# Patient Record
Sex: Female | Born: 1970
Health system: Southern US, Community
[De-identification: ages and names within clinical notes are randomized; demographics above are authoritative.]

## PROBLEM LIST (undated history)

## (undated) DIAGNOSIS — M1990 Unspecified osteoarthritis, unspecified site: Secondary | ICD-10-CM

## (undated) DIAGNOSIS — K219 Gastro-esophageal reflux disease without esophagitis: Secondary | ICD-10-CM

## (undated) DIAGNOSIS — F32A Depression, unspecified: Secondary | ICD-10-CM

## (undated) DIAGNOSIS — D649 Anemia, unspecified: Secondary | ICD-10-CM

## (undated) DIAGNOSIS — F419 Anxiety disorder, unspecified: Secondary | ICD-10-CM

## (undated) DIAGNOSIS — D497 Neoplasm of unspecified behavior of endocrine glands and other parts of nervous system: Secondary | ICD-10-CM

## (undated) DIAGNOSIS — R002 Palpitations: Secondary | ICD-10-CM

## (undated) HISTORY — DX: Gastro-esophageal reflux disease without esophagitis: K21.9

## (undated) HISTORY — DX: Palpitations: R00.2

## (undated) HISTORY — PX: TONSILLECTOMY: SUR1361

## (undated) HISTORY — PX: COLONOSCOPY: SHX174

## (undated) HISTORY — DX: Unspecified osteoarthritis, unspecified site: M19.90

## (undated) HISTORY — PX: ABDOMINAL HYSTERECTOMY: SHX81

---

## 2003-08-10 ENCOUNTER — Emergency Department (HOSPITAL_COMMUNITY): Admission: AD | Admit: 2003-08-10 | Discharge: 2003-08-10 | Payer: Self-pay | Admitting: Family Medicine

## 2004-06-15 ENCOUNTER — Ambulatory Visit (HOSPITAL_COMMUNITY): Admission: RE | Admit: 2004-06-15 | Discharge: 2004-06-15 | Payer: Self-pay | Admitting: Chiropractic Medicine

## 2005-11-14 ENCOUNTER — Emergency Department (HOSPITAL_COMMUNITY): Admission: EM | Admit: 2005-11-14 | Discharge: 2005-11-14 | Payer: Self-pay | Admitting: Emergency Medicine

## 2007-06-22 ENCOUNTER — Emergency Department (HOSPITAL_COMMUNITY): Admission: EM | Admit: 2007-06-22 | Discharge: 2007-06-22 | Payer: Self-pay | Admitting: Emergency Medicine

## 2008-06-24 ENCOUNTER — Emergency Department (HOSPITAL_COMMUNITY): Admission: EM | Admit: 2008-06-24 | Discharge: 2008-06-24 | Payer: Self-pay | Admitting: Family Medicine

## 2009-12-21 ENCOUNTER — Emergency Department (HOSPITAL_COMMUNITY): Admission: EM | Admit: 2009-12-21 | Discharge: 2009-12-21 | Payer: Self-pay | Admitting: Emergency Medicine

## 2010-04-14 ENCOUNTER — Emergency Department (HOSPITAL_COMMUNITY): Admission: EM | Admit: 2010-04-14 | Discharge: 2010-04-14 | Payer: Self-pay | Admitting: Family Medicine

## 2010-07-01 ENCOUNTER — Encounter
Admission: RE | Admit: 2010-07-01 | Discharge: 2010-07-01 | Payer: Self-pay | Source: Home / Self Care | Attending: Chiropractic Medicine | Admitting: Chiropractic Medicine

## 2011-04-01 LAB — POCT URINALYSIS DIP (DEVICE)
Bilirubin Urine: NEGATIVE
Glucose, UA: NEGATIVE mg/dL
Hgb urine dipstick: NEGATIVE
Ketones, ur: NEGATIVE mg/dL
Nitrite: NEGATIVE
Protein, ur: NEGATIVE mg/dL
Specific Gravity, Urine: 1.01 (ref 1.005–1.030)
Urobilinogen, UA: 0.2 mg/dL (ref 0.0–1.0)
pH: 6 (ref 5.0–8.0)

## 2011-04-01 LAB — POCT PREGNANCY, URINE: Preg Test, Ur: NEGATIVE

## 2011-08-01 ENCOUNTER — Other Ambulatory Visit: Payer: Self-pay | Admitting: Obstetrics and Gynecology

## 2011-08-04 ENCOUNTER — Other Ambulatory Visit: Payer: Self-pay

## 2011-08-08 ENCOUNTER — Ambulatory Visit
Admission: RE | Admit: 2011-08-08 | Discharge: 2011-08-08 | Disposition: A | Discharge status: Home / Self Care | Payer: BC Managed Care – PPO | Source: Ambulatory Visit | Attending: Obstetrics and Gynecology | Admitting: Obstetrics and Gynecology

## 2011-08-08 MED ORDER — GADOBENATE DIMEGLUMINE 529 MG/ML IV SOLN: 7.0000 mL | Freq: Once | INTRAVENOUS | Status: AC | PRN

## 2011-11-29 ENCOUNTER — Other Ambulatory Visit: Payer: Self-pay | Admitting: *Deleted

## 2011-11-29 ENCOUNTER — Ambulatory Visit: Payer: BC Managed Care – PPO | Admitting: Emergency Medicine

## 2011-11-29 ENCOUNTER — Encounter: Payer: Self-pay | Admitting: Family Medicine

## 2011-11-29 VITALS — BP 105/63 | HR 70 | Temp 98.6°F | Resp 16 | Ht 63.0 in | Wt 129.0 lb

## 2011-11-29 DIAGNOSIS — E860 Dehydration: Secondary | ICD-10-CM

## 2011-11-29 DIAGNOSIS — A084 Viral intestinal infection, unspecified: Secondary | ICD-10-CM

## 2011-11-29 DIAGNOSIS — A088 Other specified intestinal infections: Secondary | ICD-10-CM

## 2011-11-29 DIAGNOSIS — F3131 Bipolar disorder, current episode depressed, mild: Secondary | ICD-10-CM

## 2011-11-29 MED ORDER — ONDANSETRON 8 MG PO TBDP: 8.0000 mg | ORAL_TABLET | Freq: Three times a day (TID) | ORAL | Status: AC | PRN

## 2011-11-29 MED ORDER — LOPERAMIDE HCL 2 MG PO TABS: ORAL_TABLET | ORAL | Status: DC

## 2011-11-29 MED ORDER — LOPERAMIDE HCL 2 MG PO TABS: 4.0000 mg | ORAL_TABLET | ORAL | Status: DC | PRN

## 2011-11-29 NOTE — Progress Notes (Signed)
  Subjective:    Patient ID: Connie Cole, female    DOB: 08-26-1970, 41 y.o.   MRN: [id number]  Emesis  This is a new problem. The current episode started in the past 7 days. The problem occurs 5 to 10 times per day. The problem has been gradually improving. The emesis has an appearance of bile and stomach contents. The maximum temperature recorded prior to her arrival was 100 - 100.9 F. The fever has been present for 1 to 2 days. Associated symptoms include abdominal pain, chills, coughing, diarrhea, dizziness, a fever, headaches and myalgias. Pertinent negatives include no arthralgias, chest pain, sweats, URI or weight loss.  Abdominal Pain Associated symptoms include diarrhea, a fever, headaches, myalgias and vomiting. Pertinent negatives include no arthralgias or weight loss.      Review of Systems  Constitutional: Positive for fever, chills and fatigue. Negative for weight loss.  HENT: Positive for congestion.   Eyes: Negative.   Respiratory: Positive for cough.   Cardiovascular: Negative.  Negative for chest pain.  Gastrointestinal: Positive for vomiting, abdominal pain and diarrhea.  Genitourinary: Negative.   Musculoskeletal: Positive for myalgias. Negative for arthralgias.  Neurological: Positive for dizziness and headaches.       Objective:   Physical Exam  Constitutional: She is oriented to person, place, and time. She appears well-developed and well-nourished.  HENT:  Head: Normocephalic and atraumatic.  Right Ear: External ear normal.  Left Ear: External ear normal.  Eyes: Conjunctivae and EOM are normal. Pupils are equal, round, and reactive to light.  Neck: Normal range of motion. Neck supple.  Cardiovascular: Normal rate, regular rhythm and normal heart sounds.   Pulmonary/Chest: Effort normal and breath sounds normal.  Abdominal: Soft. Bowel sounds are increased. There is no tenderness. There is no rebound and no guarding.  Musculoskeletal: Normal range of  motion.  Neurological: She is oriented to person, place, and time.  Skin: Skin is warm and dry.          Assessment & Plan:  Symptoms started after eating burgers over the weekend

## 2011-11-29 NOTE — Patient Instructions (Signed)
Viral Gastroenteritis Viral gastroenteritis is also known as stomach flu. This condition affects the stomach and intestinal tract. It can cause sudden diarrhea and vomiting. The illness typically lasts 3 to 8 days. Most people develop an immune response that eventually gets rid of the virus. While this natural response develops, the virus can make you quite ill. CAUSES  Many different viruses can cause gastroenteritis, such as rotavirus or noroviruses. You can catch one of these viruses by consuming contaminated food or water. You may also catch a virus by sharing utensils or other personal items with an infected person or by touching a contaminated surface. SYMPTOMS  The most common symptoms are diarrhea and vomiting. These problems can cause a severe loss of body fluids (dehydration) and a body salt (electrolyte) imbalance. Other symptoms may include:  Fever.   Headache.   Fatigue.   Abdominal pain.  DIAGNOSIS  Your caregiver can usually diagnose viral gastroenteritis based on your symptoms and a physical exam. A stool sample may also be taken to test for the presence of viruses or other infections. TREATMENT  This illness typically goes away on its own. Treatments are aimed at rehydration. The most serious cases of viral gastroenteritis involve vomiting so severely that you are not able to keep fluids down. In these cases, fluids must be given through an intravenous line (IV). HOME CARE INSTRUCTIONS   Drink enough fluids to keep your urine clear or pale yellow. Drink small amounts of fluids frequently and increase the amounts as tolerated.   Ask your caregiver for specific rehydration instructions.   Avoid:   Foods high in sugar.   Alcohol.   Carbonated drinks.   Tobacco.   Juice.   Caffeine drinks.   Extremely hot or cold fluids.   Fatty, greasy foods.   Too much intake of anything at one time.   Dairy products until 24 to 48 hours after diarrhea stops.   You may  consume probiotics. Probiotics are active cultures of beneficial bacteria. They may lessen the amount and number of diarrheal stools in adults. Probiotics can be found in yogurt with active cultures and in supplements.   Wash your hands well to avoid spreading the virus.   Only take over-the-counter or prescription medicines for pain, discomfort, or fever as directed by your caregiver. Do not give aspirin to children. Antidiarrheal medicines are not recommended.   Ask your caregiver if you should continue to take your regular prescribed and over-the-counter medicines.   Keep all follow-up appointments as directed by your caregiver.  SEEK IMMEDIATE MEDICAL CARE IF:   You are unable to keep fluids down.   You do not urinate at least once every 6 to 8 hours.   You develop shortness of breath.   You notice blood in your stool or vomit. This may look like coffee grounds.   You have abdominal pain that increases or is concentrated in one small area (localized).   You have persistent vomiting or diarrhea.   You have a fever.   The patient is a child younger than 3 months, and he or she has a fever.   The patient is a child older than 3 months, and he or she has a fever and persistent symptoms.   The patient is a child older than 3 months, and he or she has a fever and symptoms suddenly get worse.   The patient is a baby, and he or she has no tears when crying.  MAKE SURE YOU:     Understand these instructions.   Will watch your condition.   Will get help right away if you are not doing well or get worse.  Document Released: 06/13/2005 Document Revised: 06/02/2011 Document Reviewed: 03/30/2011 ExitCare Patient Information 2012 [employer]. 

## 2012-01-30 ENCOUNTER — Other Ambulatory Visit: Payer: Self-pay | Admitting: Endocrinology

## 2012-03-07 ENCOUNTER — Other Ambulatory Visit: Payer: Self-pay | Admitting: Gastroenterology

## 2012-03-07 DIAGNOSIS — R1032 Left lower quadrant pain: Secondary | ICD-10-CM

## 2012-03-09 ENCOUNTER — Other Ambulatory Visit: Payer: BC Managed Care – PPO

## 2012-03-12 ENCOUNTER — Ambulatory Visit
Admission: RE | Admit: 2012-03-12 | Discharge: 2012-03-12 | Disposition: A | Discharge status: Home / Self Care | Payer: BC Managed Care – PPO | Source: Ambulatory Visit | Attending: Gastroenterology | Admitting: Gastroenterology

## 2012-03-12 ENCOUNTER — Other Ambulatory Visit: Payer: BC Managed Care – PPO

## 2012-03-12 DIAGNOSIS — R1032 Left lower quadrant pain: Secondary | ICD-10-CM

## 2012-03-12 MED ORDER — IOHEXOL 300 MG/ML  SOLN
75.0000 mL | Freq: Once | INTRAMUSCULAR | Status: AC | PRN
  Administered 2012-03-12: 75 mL via INTRAVENOUS

## 2012-08-30 ENCOUNTER — Other Ambulatory Visit: Payer: Self-pay | Admitting: Orthopedic Surgery

## 2012-08-30 DIAGNOSIS — R609 Edema, unspecified: Secondary | ICD-10-CM

## 2012-08-31 ENCOUNTER — Other Ambulatory Visit: Payer: BC Managed Care – PPO

## 2012-11-21 ENCOUNTER — Other Ambulatory Visit: Payer: Self-pay | Admitting: Chiropractic Medicine

## 2012-11-21 DIAGNOSIS — M542 Cervicalgia: Secondary | ICD-10-CM

## 2012-11-28 ENCOUNTER — Ambulatory Visit
Admission: RE | Admit: 2012-11-28 | Discharge: 2012-11-28 | Disposition: A | Discharge status: Home / Self Care | Payer: BC Managed Care – PPO | Source: Ambulatory Visit | Attending: Chiropractic Medicine | Admitting: Chiropractic Medicine

## 2012-11-28 DIAGNOSIS — M542 Cervicalgia: Secondary | ICD-10-CM

## 2015-07-21 LAB — HM PAP SMEAR: HM Pap smear: NEGATIVE

## 2015-08-17 ENCOUNTER — Other Ambulatory Visit: Payer: Self-pay | Admitting: General Surgery

## 2015-08-17 NOTE — H&P (Signed)
Connie Cole 08/17/2015 3:32 PM Location: [facility] Patient #: [id number] DOB: February 06, 1971 Married / Language: Cleophus Molt / Race: White Female  History of Present Illness Leighton Ruff MD; 123456 5:35 PM) Patient words: prolapse.  The patient is a 45 year old female who presents with rectal prolapse. 45 year old female who presents to the office with rectal prolapse. She states that it has worsened over the past couple months. Patient has a long-standing history of constipation and straining due to urination and bowel movements. She has to use magnesium to get her bowel movements to liquid consistencies so that she can have evacuation of her rectum. She complains of urinary issues which are being treated by Dr. Louis Meckel. This is mainly frequent urination and difficulty starting her stream. Patient also has abnormal uterine bleeding. She is considering hysterectomy. She denies any rectal bleeding. Her prolapse easily reduces after bowel movements.   Other Problems Mammie Lorenzo, LPN; 624THL D34-534 PM) Arthritis Back Pain Bladder Problems Gastroesophageal Reflux Disease Heart murmur Other disease, cancer, significant illness  Past Surgical History Mammie Lorenzo, LPN; 624THL D34-534 PM) Colon Polyp Removal - Colonoscopy Tonsillectomy  Diagnostic Studies History Mammie Lorenzo, LPN; 624THL D34-534 PM) Colonoscopy 1-5 years ago Mammogram within last year  Allergies Mammie Lorenzo, LPN; 624THL 624THL PM) Latex Exam Gloves *MEDICAL DEVICES AND SUPPLIES*  Medication History Mammie Lorenzo, LPN; 624THL 624THL PM) Cabergoline (0.5MG  Tablet, Oral) Active. Medications Reconciled  Social History Mammie Lorenzo, LPN; 624THL D34-534 PM) Alcohol use Moderate alcohol use. No caffeine use No drug use Tobacco use Former smoker.  Family History Mammie Lorenzo, LPN; 624THL D34-534 PM) Cerebrovascular Accident Mother. Hypertension Mother. Thyroid  problems Mother.  Pregnancy / Birth History Mammie Lorenzo, LPN; 624THL D34-534 PM) Age at menarche 61 years. Contraceptive History Oral contraceptives. Gravida 2 Irregular periods Maternal age 35-25 Para 0     Review of Systems Mammie Lorenzo LPN; 624THL D34-534 PM) General Not Present- Appetite Loss, Chills, Fatigue, Fever, Night Sweats, Weight Gain and Weight Loss. Skin Not Present- Change in Wart/Mole, Dryness, Hives, Jaundice, New Lesions, Non-Healing Wounds, Rash and Ulcer. HEENT Present- Seasonal Allergies, Sinus Pain and Sore Throat. Not Present- Earache, Hearing Loss, Hoarseness, Nose Bleed, Oral Ulcers, Ringing in the Ears, Visual Disturbances, Wears glasses/contact lenses and Yellow Eyes. Respiratory Not Present- Bloody sputum, Chronic Cough, Difficulty Breathing, Snoring and Wheezing. Breast Present- Nipple Discharge. Not Present- Breast Mass, Breast Pain and Skin Changes. Cardiovascular Present- Palpitations. Not Present- Chest Pain, Difficulty Breathing Lying Down, Leg Cramps, Rapid Heart Rate, Shortness of Breath and Swelling of Extremities. Gastrointestinal Present- Bloating, Chronic diarrhea and Constipation. Not Present- Abdominal Pain, Bloody Stool, Change in Bowel Habits, Difficulty Swallowing, Excessive gas, Gets full quickly at meals, Hemorrhoids, Indigestion, Nausea, Rectal Pain and Vomiting. Female Genitourinary Present- Frequency, Nocturia, Painful Urination, Pelvic Pain and Urgency. Musculoskeletal Present- Back Pain, Joint Pain, Joint Stiffness and Muscle Pain. Not Present- Muscle Weakness and Swelling of Extremities. Neurological Not Present- Decreased Memory, Fainting, Headaches, Numbness, Seizures, Tingling, Tremor, Trouble walking and Weakness. Psychiatric Not Present- Anxiety, Bipolar, Change in Sleep Pattern, Depression, Fearful and Frequent crying. Endocrine Not Present- Cold Intolerance, Excessive Hunger, Hair Changes, Heat Intolerance, Hot flashes  and New Diabetes. Hematology Not Present- Easy Bruising, Excessive bleeding, Gland problems, HIV and Persistent Infections.  Vitals Claiborne Billings Dockery LPN; 624THL 624THL PM) 08/17/2015 3:32 PM Weight: 132.2 lb Temp.: 98.63F(Oral)  Pulse: 74 (Regular)  BP: 104/62 (Sitting, Left Arm, Standard)      Physical Exam Leighton Ruff MD; 123456 5:30 PM)  General  Mental Status-Alert. General Appearance-Not in acute distress. Build & Nutrition-Well nourished. Posture-Normal posture. Gait-Normal.  Head and Neck Head-normocephalic, atraumatic with no lesions or palpable masses. Trachea-midline.  Chest and Lung Exam Chest and lung exam reveals -on auscultation, normal breath sounds, no adventitious sounds and normal vocal resonance.  Cardiovascular Cardiovascular examination reveals -normal heart sounds, regular rate and rhythm with no murmurs.  Abdomen Inspection Inspection of the abdomen reveals - No Hernias. Palpation/Percussion Palpation and Percussion of the abdomen reveal - Soft, Non Tender, No Rigidity (guarding), No hepatosplenomegaly and No Palpable abdominal masses.  Rectal Anorectal Exam External - Note: decreased sphincter tone. Internal - Note: decreased sphincter tone, no mass.  Neurologic Neurologic evaluation reveals -alert and oriented x 3 with no impairment of recent or remote memory, normal attention span and ability to concentrate, normal sensation and normal coordination.  Musculoskeletal Normal Exam - Bilateral-Upper Extremity Strength Normal and Lower Extremity Strength Normal.   Results Leighton Ruff MD; 123456 5:31 PM) Procedures  Name Value Date ANOSCOPY, DIAGNOSTIC [id number]) [ Hemorrhoids ] Procedure Other: Procedure: Anoscopy Surgeon: Marcello Moores After the risks and benefits were explained, verbal consent was obtained for above procedure. A medical assistant chaperone was present thoroughout the entire procedure.  Anesthesia: none Diagnosis: rectal prolapse Findings: Normal squeeze and push pressures. Liquid stool in rectal vault. No hemorrhoid disease noted. Redundant mucosa noted. Patient allowed to Valsalva on the toilet and approximately 3 cm of complete rectal prolapse was noted.  Performed: 08/17/2015 5:30 PM    Assessment & Plan Leighton Ruff MD; 123456 5:35 PM)  COMPLETE RECTAL PROLAPSE WITH DISPLACEMENT OF ANAL SPHINCTER (K62.3) Impression: 45 year old female with a history of constipation presents to the office with rectal prolapse. She also has urinary symptoms and abnormal uterine bleeding. I have recommended that we proceed with colon resection and rectopexy. We discussed this in detail including risk and benefits as well as need for colon resection due to her constipation. We discussed alternative treatments like perineal repairs and discussed that these would have very high recurrence rates. We discussed possible nerve injuries and ureter injuries. We discussed possible leak after surgical anastomosis. She is mainly concerned about her surgical scars. I showed her a couple pictures of what typical robotic surgery scars look like. She would also like her hysterectomy performed at the same time. We will touch base with Dr. Ronita Hipps office to see if this is a possibility. The surgery and anatomy were described to the patient as well as the risks of surgery and the possible complications. These include: Bleeding, deep abdominal infections and possible wound complications such as hernia and infection, damage to adjacent structures, leak of surgical connections, which can lead to other surgeries and possibly an ostomy, possible need for other procedures, such as abscess drains in radiology, possible prolonged hospital stay, possible diarrhea from removal of part of the colon, possible constipation from narcotics, possible bowel, bladder or sexual dysfunction if having rectal surgery, prolonged  fatigue/weakness or appetite loss, possible early recurrence of of disease, possible complications of their medical problems such as heart disease or arrhythmias or lung problems, death (less than 1%). I believe the patient understands and wishes to proceed with the surgery.

## 2015-12-14 ENCOUNTER — Ambulatory Visit: Payer: BLUE CROSS/BLUE SHIELD | Admitting: Family Medicine

## 2015-12-15 ENCOUNTER — Other Ambulatory Visit: Payer: Self-pay | Admitting: Internal Medicine

## 2015-12-15 ENCOUNTER — Ambulatory Visit
Admission: RE | Admit: 2015-12-15 | Discharge: 2015-12-15 | Disposition: A | Discharge status: Home / Self Care | Payer: BLUE CROSS/BLUE SHIELD | Source: Ambulatory Visit | Attending: Internal Medicine | Admitting: Internal Medicine

## 2015-12-15 DIAGNOSIS — M25551 Pain in right hip: Secondary | ICD-10-CM

## 2015-12-16 ENCOUNTER — Encounter: Payer: Self-pay | Admitting: Family Medicine

## 2015-12-16 ENCOUNTER — Ambulatory Visit (INDEPENDENT_AMBULATORY_CARE_PROVIDER_SITE_OTHER): Payer: BLUE CROSS/BLUE SHIELD | Admitting: Family Medicine

## 2015-12-16 VITALS — BP 112/74 | HR 81 | Ht 62.75 in | Wt 135.9 lb

## 2015-12-16 DIAGNOSIS — L219 Seborrheic dermatitis, unspecified: Secondary | ICD-10-CM | POA: Insufficient documentation

## 2015-12-16 DIAGNOSIS — K219 Gastro-esophageal reflux disease without esophagitis: Secondary | ICD-10-CM | POA: Diagnosis not present

## 2015-12-16 DIAGNOSIS — L218 Other seborrheic dermatitis: Secondary | ICD-10-CM | POA: Diagnosis not present

## 2015-12-16 DIAGNOSIS — B354 Tinea corporis: Secondary | ICD-10-CM

## 2015-12-16 MED ORDER — KETOCONAZOLE 2 % EX SHAM: 1.0000 "application " | MEDICATED_SHAMPOO | CUTANEOUS | Status: DC

## 2015-12-16 MED ORDER — CLOTRIMAZOLE-BETAMETHASONE 1-0.05 % EX CREA: 1.0000 "application " | TOPICAL_CREAM | Freq: Two times a day (BID) | CUTANEOUS | Status: DC

## 2015-12-16 MED ORDER — OMEPRAZOLE 20 MG PO TBEC: 20.0000 mg | DELAYED_RELEASE_TABLET | Freq: Once | ORAL | Status: DC

## 2015-12-16 NOTE — Progress Notes (Signed)
Connie Cole, D.O. Primary care at [facility]  Subjective:    CC: New pt, here to establish care.   HPI: Connie Cole is a pleasant 45 y.o. female who presents to [place] [facility] today To become established.  Main concern:   Gets a rash monthly assoc with her periods. Gets these over her shoulders on R, middle of shoulder blades and scalp.     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  No kids.  Committed relationship-married one yr Sept. Together 4 yrs- with Husband BRADY, who sawe me last week.   Works out- at [employer] and home.     Osteoarthritis: B/L Knees- did stem cell injections- flexogenix in [place].  Worked great. B/l injections--> popping is going away.   She was diagnosed in the past with a benign tumor in her brain, prolactinoma and saw Dr. Collene Mares of endocrinology.  No longer with any symptoms.  Goes to [facility] and sees Dr. Laurin Coder for her GYN issues.  She gets yearly exams and mammograms through them.  She has a history of ovarian fibroids  History of a bulging disc in her neck at C3 level, she believes.  Chronic neck pain has been issue in the past but is not currently.  She does do therapeutic massage for this helps a lot  History rectal prolapse: She strained to urinate and pass her bowels in the past and was told she caused her rectal prolapse.  She is currently asymptomatic but has followed with urology in the past for this per the patient.  She has seen [facility]: She has a history of tearing both her MCL's in her knees around age 19.  She did not have surgery at that time and has just been working out and keeping her knees and legs strong.   Past Medical History  Diagnosis Date  . Arthritis   . GERD (gastroesophageal reflux disease)   . Palpitations     Past Surgical History  Procedure Laterality Date  .  Tonsillectomy      Family History  Problem Relation Age of Onset  . Stroke Mother   . Heart disease Mother   . Hypertension Mother   . Cancer Father     eye  . Healthy Sister     History  Drug Use No  ,  History  Alcohol Use  . 4.8 oz/week  . 8 Glasses of wine per week  ,  History  Smoking status  . Former Smoker  . Quit date: 06/27/2000  Smokeless tobacco  . Never Used  ,  History  Sexual Activity  . Sexual Activity: Yes  . Birth Control/ Protection: None    Patient's Medications   No medications on file    ALLERGIES: Review of patient's allergies indicates no known allergies.  Review of Systems:   ( Completed via her Adult Medical History Intake form today ) General:   Denies fever, chills, appetite changes, unexplained weight loss.  Optho/Auditory:   Denies visual changes, blurred vision/LOV, ringing in ears/ diff hearing Respiratory:   Denies SOB, DOE, cough, wheezing.  Cardiovascular:   Denies chest pain, palpitations, new onset peripheral edema  Gastrointestinal:   Denies nausea, vomiting, diarrhea.  Genitourinary:    Denies dysuria, increased frequency, flank pain.  Endocrine:     Denies hot or cold intolerance, polyuria, polydipsia. Musculoskeletal:  Denies unexplained myalgias, joint swelling, arthralgias, gait problems.  Skin:  Denies rash, suspicious lesions or new/ changes in moles Neurological:    Denies dizziness, syncope, unexplained weakness, lightheadedness, numbness  Psychiatric/Behavioral:   Denies mood changes, suicidal or homicidal ideations, hallucinations    Objective:   Blood pressure 112/74, pulse 81, height 5' 2.75" (1.594 m), weight 135 lb 14.4 oz (61.644 kg), last menstrual period 11/28/2015. Body mass index is 24.26 kg/(m^2).  General: Well Developed- muscular build, well nourished, and in no acute distress.  Neuro: Alert and oriented x3, extra-ocular muscles intact, sensation grossly intact.  HEENT: Normocephalic, atraumatic,  pupils equal round reactive to light, neck supple, no gross masses, no carotid bruits, no JVD apprec, no flaking skin.  Bilateral auditory canals.  TMs within normal limits bilaterally. Skin: no gross suspicious lesions, No rashes appreciated on scalp, shoulders, chest or back. Cardiac: Regular rate and rhythm, no murmurs rubs or gallops.  Respiratory: Essentially clear to auscultation bilaterally. Not using accessory muscles, speaking in full sentences.  Abdominal: Soft, not grossly distended Musculoskeletal: Ambulates w/o diff, FROM * 4 ext.  Vasc: less 2 sec cap RF, warm and pink  Psych:  No HI/SI, judgement and insight good.    Impression and Recommendations:    The patient was counselled, risk factors were discussed, anticipatory guidance given.  Tinea corporis (h/o tinea versicolor per pt) Although I do not appreciate a rash on patient at all.  Today, I will prescribe Lotrisone cream when necessary for future use.  I asked patient to follow-up with me if she does get a rash so that I may assess it while it's ACTIVE.  GERD (gastroesophageal reflux disease) Symptoms stable, refills, omeprazole when necessary.  Counseling done with patient regarding foods to avoid and triggers.  No eating past 6PM  Seborrheic dermatitis of scalp Use Nizoral shampoo twice weekly.  Explained to patient that I did not see any active rashes in her scalp today.  However, is likely due to seborrheic dermatitis and shampoo should help.  Follow-up as needed if no improvement.  Gross side effects, risk and benefits, and alternatives of medications discussed with patient.  Patient is aware that all medications have potential side effects and we are unable to predict every sideeffect or drug-drug interaction that may occur.  Expresses verbal understanding and consents to current therapy plan and treatment regiment.  Note: This document was prepared using Dragon voice recognition software and may include  unintentional dictation errors.

## 2015-12-16 NOTE — Patient Instructions (Signed)
Use shampoo/ cream as indicated/ needed    Seborrheic Dermatitis Seborrheic dermatitis involves pink or red skin with greasy, flaky scales. It usually occurs on the scalp, and it is often called dandruff. This condition may also affect the eyebrows, nose, ears, chest, and the bearded area of men's faces. It often occurs where skin has more oil (sebaceous) glands. It may come and go for no known reason, and it is often long-lasting (chronic). CAUSES The cause is not known. RISK FACTORS This condition is more like to develop in:  People who are stressed or tired.  People who have skin conditions, such as acne.  People who have certain conditions, such as:  HIV (human immunodeficiency virus).  AIDS (acquired immunodeficiency syndrome).  Parkinson disease.  An eating disorder.  Stroke.  Depression.  Epilepsy.  Alcoholism.  People who live in places that have extreme weather.  People who have a family history of seborrheic dermatitis.  People who use skin creams that are made with alcohol.  People who are 23-26 years old.  People who take certain medicines. SYMPTOMS Symptoms of this condition include:  Thick scales on the scalp.  Redness on the face or in the armpits.  Skin that is flaky. The flakes may be white or yellow.  Skin that seems oily or dry but is not helped with moisturizers.  Itching or burning in the affected areas. DIAGNOSIS This condition is diagnosed with a medical history and physical exam. A sample of your skin may be tested (skin biopsy). You may need to see a skin specialist (dermatologist). TREATMENT There is no cure for this condition, but treatment can help to manage the symptoms. Treatment may include:  Cortisone (steroid) ointments, creams, and lotions.  Over-the-counter or prescription shampoos. HOME CARE INSTRUCTIONS  Apply over-the-counter and prescription medicines only as told by your health care provider.  Keep all follow-up  visits as told by your health care provider. This is important.  Try to reduce your stress, such as with yoga or mediation. If you need help to reduce stress, ask your health care provider.  Shower or bathe as told by your health care provider.  Use any medicated shampoos as told by your health care provider. SEEK MEDICAL CARE IF:  Your symptoms do not improve with treatment.  Your symptoms get worse.  You have new symptoms.   This information is not intended to replace advice given to you by your health care provider. Make sure you discuss any questions you have with your health care provider.   Document Released: 06/13/2005 Document Revised: 03/04/2015 Document Reviewed: 10/29/2014 Elsevier Interactive Patient Education [employer].

## 2015-12-18 DIAGNOSIS — Z1239 Encounter for other screening for malignant neoplasm of breast: Secondary | ICD-10-CM

## 2015-12-18 LAB — HM MAMMOGRAPHY: Mammo external findings: NEGATIVE

## 2015-12-20 DIAGNOSIS — B354 Tinea corporis: Secondary | ICD-10-CM | POA: Insufficient documentation

## 2015-12-20 NOTE — Assessment & Plan Note (Signed)
Use Nizoral shampoo twice weekly.  Explained to patient that I did not see any active rashes in her scalp today.  However, is likely due to seborrheic dermatitis and shampoo should help.  Follow-up as needed if no improvement.

## 2015-12-20 NOTE — Assessment & Plan Note (Signed)
Symptoms stable, refills, omeprazole when necessary.  Counseling done with patient regarding foods to avoid and triggers.  No eating past 6PM

## 2015-12-20 NOTE — Assessment & Plan Note (Signed)
Although I do not appreciate a rash on patient at all.  Today, I will prescribe Lotrisone cream when necessary for future use.  I asked patient to follow-up with me if she does get a rash so that I may assess it while it's ACTIVE.

## 2015-12-22 ENCOUNTER — Ambulatory Visit: Payer: BLUE CROSS/BLUE SHIELD | Admitting: Family Medicine

## 2015-12-23 ENCOUNTER — Other Ambulatory Visit (INDEPENDENT_AMBULATORY_CARE_PROVIDER_SITE_OTHER): Payer: BLUE CROSS/BLUE SHIELD

## 2015-12-23 ENCOUNTER — Other Ambulatory Visit: Payer: Self-pay

## 2015-12-23 DIAGNOSIS — Z0000 Encounter for general adult medical examination without abnormal findings: Secondary | ICD-10-CM

## 2015-12-23 LAB — CBC WITH DIFFERENTIAL/PLATELET
BASOS ABS: 63 {cells}/uL (ref 0–200)
Basophils Relative: 1 %
EOS PCT: 3 %
Eosinophils Absolute: 189 cells/uL (ref 15–500)
HCT: 38.2 % (ref 35.0–45.0)
Hemoglobin: 12.3 g/dL (ref 11.7–15.5)
Lymphocytes Relative: 25 %
Lymphs Abs: 1575 cells/uL (ref 850–3900)
MCH: 31.4 pg (ref 27.0–33.0)
MCHC: 32.2 g/dL (ref 32.0–36.0)
MCV: 97.4 fL (ref 80.0–100.0)
MONOS PCT: 8 %
MPV: 9.8 fL (ref 7.5–12.5)
Monocytes Absolute: 504 cells/uL (ref 200–950)
NEUTROS ABS: 3969 {cells}/uL (ref 1500–7800)
Neutrophils Relative %: 63 %
PLATELETS: 345 10*3/uL (ref 140–400)
RBC: 3.92 MIL/uL (ref 3.80–5.10)
RDW: 12.9 % (ref 11.0–15.0)
WBC: 6.3 10*3/uL (ref 3.8–10.8)

## 2015-12-24 LAB — COMPREHENSIVE METABOLIC PANEL
ALT: 13 U/L (ref 6–29)
AST: 15 U/L (ref 10–30)
Albumin: 4.4 g/dL (ref 3.6–5.1)
Alkaline Phosphatase: 35 U/L (ref 33–115)
BUN: 12 mg/dL (ref 7–25)
CHLORIDE: 102 mmol/L (ref 98–110)
CO2: 23 mmol/L (ref 20–31)
CREATININE: 0.71 mg/dL (ref 0.50–1.10)
Calcium: 9.5 mg/dL (ref 8.6–10.2)
GLUCOSE: 93 mg/dL (ref 65–99)
Potassium: 4.2 mmol/L (ref 3.5–5.3)
SODIUM: 136 mmol/L (ref 135–146)
TOTAL PROTEIN: 7 g/dL (ref 6.1–8.1)
Total Bilirubin: 0.3 mg/dL (ref 0.2–1.2)

## 2015-12-24 LAB — HEMOGLOBIN A1C
Hgb A1c MFr Bld: 4.4 % (ref <5.7)
Mean Plasma Glucose: 80 mg/dL

## 2015-12-24 LAB — LIPID PANEL
CHOL/HDL RATIO: 1.4 ratio (ref <=5.0)
CHOLESTEROL: 203 mg/dL — AB (ref 125–200)
HDL: 144 mg/dL (ref >=46)
LDL Cholesterol: 45 mg/dL (ref <130)
TRIGLYCERIDES: 69 mg/dL (ref <150)
VLDL: 14 mg/dL (ref <30)

## 2015-12-24 LAB — VITAMIN D 25 HYDROXY (VIT D DEFICIENCY, FRACTURES): VIT D 25 HYDROXY: 47 ng/mL (ref 30–100)

## 2015-12-24 LAB — TSH: TSH: 2.93 mIU/L

## 2015-12-24 LAB — VITAMIN B12: Vitamin B-12: 645 pg/mL (ref 200–1100)

## 2016-01-18 ENCOUNTER — Encounter: Payer: Self-pay | Admitting: Family Medicine

## 2016-01-18 ENCOUNTER — Ambulatory Visit (INDEPENDENT_AMBULATORY_CARE_PROVIDER_SITE_OTHER): Payer: BLUE CROSS/BLUE SHIELD | Admitting: Family Medicine

## 2016-01-18 DIAGNOSIS — Z8249 Family history of ischemic heart disease and other diseases of the circulatory system: Secondary | ICD-10-CM | POA: Diagnosis not present

## 2016-01-18 DIAGNOSIS — R002 Palpitations: Secondary | ICD-10-CM | POA: Diagnosis not present

## 2016-01-18 DIAGNOSIS — Z823 Family history of stroke: Secondary | ICD-10-CM | POA: Diagnosis not present

## 2016-01-18 NOTE — Patient Instructions (Signed)

## 2016-01-18 NOTE — Progress Notes (Signed)
Impression and Recommendations:    1. Palpitations   2. Family history of stroke (cerebrovascular)- mother 30's   3. Family history of atrial fibrillation      Unknown reason and might be anxiety mediated. Referral for cardiac monitor placed  Reassured patient normal physical exam and no worrisome symptoms. She understands the limitations of a cardiac monitor.  Keep a journal of when you get the symptoms and what you might of been doing, thinking, or what you may have eaten or drank that day.  Red flags discussed with patient, if develop any, though 911  Patient's Medications  New Prescriptions   No medications on file  Previous Medications   No medications on file  Modified Medications   No medications on file  Discontinued Medications   CLOTRIMAZOLE-BETAMETHASONE (LOTRISONE) CREAM    Apply 1 application topically 2 (two) times daily.   KETOCONAZOLE (NIZORAL) 2 % SHAMPOO    Apply 1 application topically 2 (two) times a week.   OMEPRAZOLE 20 MG TBEC    Take 1 tablet (20 mg total) by mouth once.    Return if symptoms worsen or fail to improve, for Follow-up as planned and discussed previously..  The patient was counseled, risk factors were discussed, anticipatory guidance given.  Gross side effects, risk and benefits, and alternatives of medications discussed with patient.  Patient is aware that all medications have potential side effects and we are unable to predict every side effect or drug-drug interaction that may occur.  Expresses verbal understanding and consents to current therapy plan and treatment regimen.  Please see AVS handed out to patient at the end of our visit for further patient instructions/ counseling done pertaining to today's office visit.    Note: This document was prepared using Dragon voice recognition software and may include unintentional dictation  errors.   --------------------------------------------------------------------------------------------------------------------------------------------------------------------------------------------------------------------------------------------------------------------------------------------------------------------------------    Subjective:    CC:  Chief Complaint  Patient presents with  . Palpitations    Request for referral to cardiology    HPI: Connie Cole is a 45 y.o. female who presents to [place] at [facility] today for issues as discussed below.   Resting HR 75-85- she thinks her heart goes too high at times, her max exercising is 140.  Playing volleyball last week- pulled chest muscles on the L and feels tightness in L chest.     Heart fluttery- Mom with h/o A-fib and stroke at age 57.  Comes on random times, lasts couple seconds up to one minute- not related to activity or rest- comes on randomly.   Pt worried about something wrong with her.   Pt exercises a lot and has not difficulty, no heart fluttering during exercise.   Takes a "thermogenic otc medicine" to help speed her metabolism.  Has long h/o palpitations.     She thinks it was across street from [place]. Did a stress test 2 yrs ago. Pt also did heart monitor- suppose to be on e it for one month but aggrivated her so only did it one week.     Patient Active Problem List   Diagnosis Date Noted  . Palpitations 01/18/2016  . Family history of stroke (cerebrovascular)- mother 100's 01/18/2016  . Family history of atrial fibrillation 01/18/2016  . Tinea corporis (h/o tinea versicolor per pt) 12/20/2015  . Seborrheic dermatitis of scalp 12/16/2015  . GERD (gastroesophageal reflux disease) 12/16/2015    Past Medical history, Surgical history, Family history, Social history, Allergies and Medications  have been entered into the medical record, reviewed and changed as needed.   No Known  Allergies  Review of Systems: No fever/ chills, night sweats, no unintended weight loss, No chest pain, or increased shortness of breath. No N/V/D.  Pertinent positives and negatives noted in HPI above    Objective:   Blood pressure 114/71, pulse 85, weight 134 lb 8 oz (61 kg), last menstrual period 01/15/2016. Vitals:   01/18/16 1047  BP: 114/71  Pulse: 85   Body mass index is 24.02 kg/m.  General: Well Developed, well nourished, appropriate for stated age.  Neuro: Alert and oriented x3, extra-ocular muscles intact, sensation grossly intact.  HEENT: Normocephalic, atraumatic, neck supple   Skin: Warm and dry, no gross rash. Cardiac: RRR, S1 S2,  no murmurs rubs or gallops.  Respiratory: ECTA B/L, Not using accessory muscles, speaking in full sentences-unlabored. Vascular:  No gross lower ext edema, cap RF less 2 sec. Psych: No SI/HI, Insight and judgement good

## 2016-01-23 ENCOUNTER — Other Ambulatory Visit: Payer: Self-pay | Admitting: Physical Medicine and Rehabilitation

## 2016-01-23 DIAGNOSIS — M25552 Pain in left hip: Principal | ICD-10-CM

## 2016-01-23 DIAGNOSIS — M25551 Pain in right hip: Secondary | ICD-10-CM

## 2016-02-01 ENCOUNTER — Encounter (INDEPENDENT_AMBULATORY_CARE_PROVIDER_SITE_OTHER): Payer: Self-pay

## 2016-02-01 ENCOUNTER — Ambulatory Visit (INDEPENDENT_AMBULATORY_CARE_PROVIDER_SITE_OTHER): Payer: BLUE CROSS/BLUE SHIELD

## 2016-02-01 DIAGNOSIS — Z823 Family history of stroke: Secondary | ICD-10-CM | POA: Diagnosis not present

## 2016-02-01 DIAGNOSIS — R002 Palpitations: Secondary | ICD-10-CM | POA: Diagnosis not present

## 2016-02-01 DIAGNOSIS — Z8249 Family history of ischemic heart disease and other diseases of the circulatory system: Secondary | ICD-10-CM | POA: Diagnosis not present

## 2016-02-04 ENCOUNTER — Inpatient Hospital Stay: Admission: RE | Admit: 2016-02-04 | Payer: BLUE CROSS/BLUE SHIELD | Source: Ambulatory Visit

## 2016-02-04 ENCOUNTER — Other Ambulatory Visit: Payer: BLUE CROSS/BLUE SHIELD

## 2016-03-10 ENCOUNTER — Other Ambulatory Visit: Payer: Self-pay | Admitting: Internal Medicine

## 2016-03-10 ENCOUNTER — Other Ambulatory Visit: Payer: Self-pay | Admitting: Lab

## 2016-03-10 ENCOUNTER — Other Ambulatory Visit: Payer: Self-pay | Admitting: Physical Medicine & Rehabilitation

## 2016-03-10 DIAGNOSIS — M1611 Unilateral primary osteoarthritis, right hip: Secondary | ICD-10-CM

## 2016-03-17 ENCOUNTER — Other Ambulatory Visit: Payer: BLUE CROSS/BLUE SHIELD

## 2016-03-18 ENCOUNTER — Other Ambulatory Visit: Payer: BLUE CROSS/BLUE SHIELD

## 2016-03-21 ENCOUNTER — Ambulatory Visit
Admission: RE | Admit: 2016-03-21 | Discharge: 2016-03-21 | Disposition: A | Discharge status: Home / Self Care | Payer: BLUE CROSS/BLUE SHIELD | Source: Ambulatory Visit | Attending: Physical Medicine & Rehabilitation | Admitting: Physical Medicine & Rehabilitation

## 2016-03-21 DIAGNOSIS — M1611 Unilateral primary osteoarthritis, right hip: Secondary | ICD-10-CM

## 2016-05-10 ENCOUNTER — Other Ambulatory Visit: Payer: Self-pay | Admitting: Obstetrics and Gynecology

## 2016-05-17 ENCOUNTER — Ambulatory Visit (HOSPITAL_COMMUNITY)
Admission: RE | Admit: 2016-05-17 | Payer: BLUE CROSS/BLUE SHIELD | Source: Ambulatory Visit | Admitting: Obstetrics and Gynecology

## 2016-05-17 ENCOUNTER — Encounter (HOSPITAL_COMMUNITY): Admission: RE | Payer: Self-pay | Source: Ambulatory Visit

## 2016-05-17 ENCOUNTER — Other Ambulatory Visit: Payer: Self-pay | Admitting: Obstetrics and Gynecology

## 2016-05-17 SURGERY — DILATATION & CURETTAGE/HYSTEROSCOPY WITH HYDROTHERMAL ABLATION
Anesthesia: Choice

## 2016-05-26 ENCOUNTER — Encounter (HOSPITAL_COMMUNITY): Payer: Self-pay | Admitting: *Deleted

## 2016-05-27 ENCOUNTER — Encounter (HOSPITAL_COMMUNITY): Payer: Self-pay | Admitting: *Deleted

## 2016-06-02 NOTE — H&P (Signed)
NAMETREBA, BECKIUS NO.:  [id number]  MEDICAL RECORD NO.:  [id number]  LOCATION:                                 FACILITY:  PHYSICIAN:  Lovenia Kim, M.D.     DATE OF BIRTH:  DATE OF ADMISSION: DATE OF DISCHARGE:                             HISTORY & PHYSICAL   CHIEF COMPLAINT:  Revealed endometrial ablation.  HISTORY OF PRESENT ILLNESS:  She is a 45 year old white female, G2, P0, who presents status post endometrial ablation in August 2017, for repeat HTA ablation.  MEDICATIONS:  Ibuprofen, Krill oil, oral cranberry tablets, multivitamin, biotin.  ALLERGIES:  Latex.  FAMILY HISTORY:  Cerebrovascular disease, eye cancer, hypertension myocardial infarction, history of ablation, and tonsillectomy.  PHYSICAL EXAMINATION:  GENERAL:  A well-developed and well-nourished white female, in no acute distress. HEENT:  Normal. NECK:  Supple.  Full range of motion. LUNGS:  Clear. HEART:  Regular rate and rhythm. ABDOMEN:  Soft and nontender. PELVIC:  Reveals an anteflexed irregular uterus, consistent with known fibroids. EXTREMITIES:  NEG C/C/E. NEUROLOGIC:  Nonfocal. SKIN:  Intact.  IMPRESSION:  Refractory menometrorrhagia with failed Novasure ablation.  The patient declines hysterectomy and plan to proceed with diagnostic hysteroscopy, HTA ablation.  Risks of anesthesia, infection, bleeding, injury to surrounding organs, possible need for repair was discussed. Delayed versus immediate complications to include bowel and bladder injury noted.  The patient acknowledges and wishes to proceed.     Lovenia Kim, M.D.    RJT/MEDQ  D:  06/02/2016  T:  06/02/2016  Job:  [id number]

## 2016-06-03 ENCOUNTER — Encounter (HOSPITAL_COMMUNITY): Payer: Self-pay

## 2016-06-03 ENCOUNTER — Ambulatory Visit (HOSPITAL_COMMUNITY): Payer: Managed Care, Other (non HMO) | Admitting: Anesthesiology

## 2016-06-03 ENCOUNTER — Ambulatory Visit (HOSPITAL_COMMUNITY)
Admission: RE | Admit: 2016-06-03 | Discharge: 2016-06-03 | Disposition: A | Discharge status: Home / Self Care | Payer: Managed Care, Other (non HMO) | Source: Ambulatory Visit | Attending: Obstetrics and Gynecology | Admitting: Obstetrics and Gynecology

## 2016-06-03 ENCOUNTER — Encounter (HOSPITAL_COMMUNITY)
Admission: RE | Disposition: A | Discharge status: Home / Self Care | Payer: Self-pay | Source: Ambulatory Visit | Attending: Obstetrics and Gynecology

## 2016-06-03 DIAGNOSIS — Z91040 Latex allergy status: Secondary | ICD-10-CM | POA: Insufficient documentation

## 2016-06-03 DIAGNOSIS — N921 Excessive and frequent menstruation with irregular cycle: Secondary | ICD-10-CM | POA: Insufficient documentation

## 2016-06-03 HISTORY — PX: DILITATION & CURRETTAGE/HYSTROSCOPY WITH HYDROTHERMAL ABLATION: SHX5570

## 2016-06-03 LAB — CBC
HCT: 40.2 % (ref 36.0–46.0)
HEMOGLOBIN: 13.7 g/dL (ref 12.0–15.0)
MCH: 34 pg (ref 26.0–34.0)
MCHC: 34.1 g/dL (ref 30.0–36.0)
MCV: 99.8 fL (ref 78.0–100.0)
Platelets: 327 10*3/uL (ref 150–400)
RBC: 4.03 MIL/uL (ref 3.87–5.11)
RDW: 12.3 % (ref 11.5–15.5)
WBC: 6.3 10*3/uL (ref 4.0–10.5)

## 2016-06-03 LAB — PREGNANCY, URINE: PREG TEST UR: NEGATIVE

## 2016-06-03 SURGERY — DILATATION & CURETTAGE/HYSTEROSCOPY WITH HYDROTHERMAL ABLATION
Anesthesia: General | Site: Vagina

## 2016-06-03 MED ORDER — KETOROLAC TROMETHAMINE 30 MG/ML IJ SOLN
INTRAMUSCULAR | Status: DC | PRN
  Administered 2016-06-03: 30 mg via INTRAVENOUS

## 2016-06-03 MED ORDER — PROPOFOL 10 MG/ML IV BOLUS
INTRAVENOUS | Status: DC | PRN
  Administered 2016-06-03: 200 mg via INTRAVENOUS

## 2016-06-03 MED ORDER — FENTANYL CITRATE (PF) 100 MCG/2ML IJ SOLN
INTRAMUSCULAR | Status: DC
Start: 2016-06-03 — End: 2016-06-03
  Filled 2016-06-03: qty 2

## 2016-06-03 MED ORDER — TRAMADOL HCL 50 MG PO TABS: 50.0000 mg | ORAL_TABLET | Freq: Four times a day (QID) | ORAL | 0 refills | Status: DC | PRN

## 2016-06-03 MED ORDER — MEPERIDINE HCL 25 MG/ML IJ SOLN: 6.2500 mg | INTRAMUSCULAR | Status: DC | PRN

## 2016-06-03 MED ORDER — ONDANSETRON HCL 4 MG/2ML IJ SOLN
INTRAMUSCULAR | Status: DC | PRN
Start: 2016-06-03 — End: 2016-06-03
  Administered 2016-06-03: 4 mg via INTRAVENOUS

## 2016-06-03 MED ORDER — SODIUM CHLORIDE 0.9 % IR SOLN
Status: DC | PRN
  Administered 2016-06-03: 3000 mL

## 2016-06-03 MED ORDER — DEXAMETHASONE SODIUM PHOSPHATE 4 MG/ML IJ SOLN
INTRAMUSCULAR | Status: DC | PRN
  Administered 2016-06-03: 10 mg via INTRAVENOUS

## 2016-06-03 MED ORDER — LIDOCAINE HCL (CARDIAC) 20 MG/ML IV SOLN
INTRAVENOUS | Status: DC | PRN
  Administered 2016-06-03: 100 mg via INTRAVENOUS

## 2016-06-03 MED ORDER — SCOPOLAMINE 1 MG/3DAYS TD PT72
MEDICATED_PATCH | TRANSDERMAL | Status: AC
  Administered 2016-06-03: 1.5 mg via TRANSDERMAL
  Filled 2016-06-03: qty 1

## 2016-06-03 MED ORDER — ONDANSETRON HCL 4 MG/2ML IJ SOLN: 4.0000 mg | Freq: Once | INTRAMUSCULAR | Status: DC | PRN

## 2016-06-03 MED ORDER — BUPIVACAINE HCL (PF) 0.25 % IJ SOLN
INTRAMUSCULAR | Status: AC
  Filled 2016-06-03: qty 30

## 2016-06-03 MED ORDER — LACTATED RINGERS IV SOLN
INTRAVENOUS | Status: DC
  Administered 2016-06-03 (×2): via INTRAVENOUS

## 2016-06-03 MED ORDER — SCOPOLAMINE 1 MG/3DAYS TD PT72
1.0000 | MEDICATED_PATCH | Freq: Once | TRANSDERMAL | Status: DC
  Administered 2016-06-03: 1.5 mg via TRANSDERMAL

## 2016-06-03 MED ORDER — FENTANYL CITRATE (PF) 100 MCG/2ML IJ SOLN
25.0000 ug | INTRAMUSCULAR | Status: DC | PRN
  Administered 2016-06-03: 25 ug via INTRAVENOUS

## 2016-06-03 MED ORDER — FENTANYL CITRATE (PF) 100 MCG/2ML IJ SOLN
INTRAMUSCULAR | Status: AC
  Filled 2016-06-03: qty 2

## 2016-06-03 MED ORDER — MIDAZOLAM HCL 2 MG/2ML IJ SOLN
INTRAMUSCULAR | Status: AC
  Filled 2016-06-03: qty 2

## 2016-06-03 MED ORDER — MIDAZOLAM HCL 5 MG/5ML IJ SOLN
INTRAMUSCULAR | Status: DC | PRN
  Administered 2016-06-03: 2 mg via INTRAVENOUS

## 2016-06-03 MED ORDER — BUPIVACAINE HCL (PF) 0.25 % IJ SOLN
INTRAMUSCULAR | Status: DC | PRN
  Administered 2016-06-03: 20 mL

## 2016-06-03 MED ORDER — KETOROLAC TROMETHAMINE 30 MG/ML IJ SOLN: 30.0000 mg | Freq: Once | INTRAMUSCULAR | Status: DC

## 2016-06-03 MED ORDER — FENTANYL CITRATE (PF) 100 MCG/2ML IJ SOLN
INTRAMUSCULAR | Status: DC | PRN
  Administered 2016-06-03 (×2): 50 ug via INTRAVENOUS

## 2016-06-03 MED ORDER — MIDAZOLAM HCL 5 MG/5ML IJ SOLN: INTRAMUSCULAR | Status: DC | PRN

## 2016-06-03 SURGICAL SUPPLY — 15 items
CATH ROBINSON RED A/P 16FR (CATHETERS) ×3 IMPLANT
CLOTH BEACON ORANGE TIMEOUT ST (SAFETY) ×3 IMPLANT
CONTAINER PREFILL 10% NBF 60ML (FORM) ×3 IMPLANT
ELECT REM PT RETURN 9FT ADLT (ELECTROSURGICAL)
ELECTRODE REM PT RTRN 9FT ADLT (ELECTROSURGICAL) IMPLANT
GLOVE BIO SURGEON STRL SZ7.5 (GLOVE) ×3 IMPLANT
GLOVE BIOGEL PI IND STRL 7.0 (GLOVE) ×1 IMPLANT
GLOVE BIOGEL PI INDICATOR 7.0 (GLOVE) ×2
GOWN STRL REUS W/TWL LRG LVL3 (GOWN DISPOSABLE) ×9 IMPLANT
NS IRRIG 1000ML POUR BTL (IV SOLUTION) IMPLANT
PACK VAGINAL MINOR WOMEN LF (CUSTOM PROCEDURE TRAY) ×3 IMPLANT
PAD OB MATERNITY 4.3X12.25 (PERSONAL CARE ITEMS) ×3 IMPLANT
SET GENESYS HTA PROCERVA (MISCELLANEOUS) ×3 IMPLANT
TOWEL OR 17X24 6PK STRL BLUE (TOWEL DISPOSABLE) ×6 IMPLANT
WATER STERILE IRR 1000ML POUR (IV SOLUTION) ×3 IMPLANT

## 2016-06-03 NOTE — Anesthesia Postprocedure Evaluation (Signed)
Anesthesia Post Note  Patient: Adahli Bilson  Procedure(s) Performed: Procedure(s) (LRB): DILATATION & CURETTAGE/HYSTEROSCOPY WITH HYDROTHERMAL ABLATION (N/A)  Patient location during evaluation: PACU Anesthesia Type: General Level of consciousness: awake Pain management: pain level controlled Vital Signs Assessment: post-procedure vital signs reviewed and stable Respiratory status: spontaneous breathing Cardiovascular status: stable Postop Assessment: no signs of nausea or vomiting Anesthetic complications: no     Last Vitals:  Vitals:   06/03/16 1315 06/03/16 1330  BP: 104/74   Pulse: 65   Resp: 16   Temp:  36.7 C    Last Pain:  Vitals:   06/03/16 1315  TempSrc:   PainSc: 2    Pain Goal: Patients Stated Pain Goal: 3 (06/03/16 1145)               [place]

## 2016-06-03 NOTE — Anesthesia Preprocedure Evaluation (Signed)
Anesthesia Evaluation  Patient identified by MRN, date of birth, ID band Patient awake    Reviewed: Allergy & Precautions, H&P , NPO status , Patient's Chart, lab work & pertinent test results  Airway Mallampati: I  TM Distance: >3 FB Neck ROM: full    Dental no notable dental hx. (+) Teeth Intact   Pulmonary neg pulmonary ROS, former smoker,    Pulmonary exam normal        Cardiovascular negative cardio ROS Normal cardiovascular exam     Neuro/Psych negative neurological ROS  negative psych ROS   GI/Hepatic Neg liver ROS,   Endo/Other  negative endocrine ROS  Renal/GU negative Renal ROS     Musculoskeletal   Abdominal Normal abdominal exam  (+)   Peds  Hematology negative hematology ROS (+)   Anesthesia Other Findings   Reproductive/Obstetrics negative OB ROS                             Anesthesia Physical Anesthesia Plan  ASA: II  Anesthesia Plan: General   Post-op Pain Management:    Induction: Intravenous  Airway Management Planned: LMA  Additional Equipment:   Intra-op Plan:   Post-operative Plan:   Informed Consent: I have reviewed the patients History and Physical, chart, labs and discussed the procedure including the risks, benefits and alternatives for the proposed anesthesia with the patient or authorized representative who has indicated his/her understanding and acceptance.     Plan Discussed with: CRNA and Surgeon  Anesthesia Plan Comments:         Anesthesia Quick Evaluation

## 2016-06-03 NOTE — Progress Notes (Signed)
Patient seen and examined. Consent witnessed and signed. No changes noted. Update completed.Patient ID: Connie Cole, female   DOB: 31-Jul-1970, 45 y.o.   MRN: [id number]

## 2016-06-03 NOTE — Transfer of Care (Signed)
Immediate Anesthesia Transfer of Care Note  Patient: Connie Cole  Procedure(s) Performed: Procedure(s): DILATATION & CURETTAGE/HYSTEROSCOPY WITH HYDROTHERMAL ABLATION (N/A)  Patient Location: PACU  Anesthesia Type:General  Level of Consciousness: awake, alert  and oriented  Airway & Oxygen Therapy: Patient Spontanous Breathing and Patient connected to nasal cannula oxygen  Post-op Assessment: Report given to RN and Post -op Vital signs reviewed and stable  Post vital signs: Reviewed and stable  Last Vitals:  Vitals:   06/03/16 0949  BP: 100/83  Pulse: 100  Resp: 16  Temp: 36.6 C    Last Pain:  Vitals:   06/03/16 0949  TempSrc: Oral      Patients Stated Pain Goal: 3 ([id number])  Complications: No apparent anesthesia complications

## 2016-06-03 NOTE — Anesthesia Procedure Notes (Signed)
Procedure Name: LMA Insertion Date/Time: 06/03/2016 11:07 AM Performed by: Riki Sheer Pre-anesthesia Checklist: Patient identified, Emergency Drugs available, Suction available and Patient being monitored Patient Re-evaluated:Patient Re-evaluated prior to inductionOxygen Delivery Method: Circle system utilized Preoxygenation: Pre-oxygenation with 100% oxygen Intubation Type: IV induction Ventilation: Mask ventilation without difficulty LMA: LMA inserted LMA Size: 4.0 Number of attempts: 1 Placement Confirmation: positive ETCO2,  CO2 detector and breath sounds checked- equal and bilateral Tube secured with: Tape Dental Injury: Teeth and Oropharynx as per pre-operative assessment

## 2016-06-03 NOTE — Discharge Instructions (Signed)
DISCHARGE INSTRUCTIONS: HYSTEROSCOPY / ENDOMETRIAL ABLATION The following instructions have been prepared to help you care for yourself upon your return home.  May Remove Scop patch on or before 06/06/16  May take Ibuprofen after 5:30pm today.   Personal hygiene:  Use sanitary pads for vaginal drainage, not tampons.  Shower the day after your procedure.  NO tub baths, pools or Jacuzzis for 2-3 weeks.  Wipe front to back after using the bathroom.  Activity and limitations:  Do NOT drive or operate any equipment for 24 hours. The effects of anesthesia are still present and drowsiness may result.  Do NOT rest in bed all day.  Walking is encouraged.  Walk up and down stairs slowly.  You may resume your normal activity in one to two days or as indicated by your physician. Sexual activity: NO intercourse for at least 2 weeks after the procedure, or as indicated by your Doctor.  Diet: Eat a light meal as desired this evening. You may resume your usual diet tomorrow.  Return to Work: You may resume your work activities in one to two days or as indicated by Marine scientist.  What to expect after your surgery: Expect to have vaginal bleeding/discharge for 2-3 days and spotting for up to 10 days. It is not unusual to have soreness for up to 1-2 weeks. You may have a slight burning sensation when you urinate for the first day. Mild cramps may continue for a couple of days. You may have a regular period in 2-6 weeks.  Call your doctor for any of the following:  Excessive vaginal bleeding or clotting, saturating and changing one pad every hour.  Inability to urinate 6 hours after discharge from hospital.  Pain not relieved by pain medication.  Fever of 100.4 F or greater.  Unusual vaginal discharge or odor.  Return to office _________________Call for an appointment ___________________ Patients signature: ______________________ Nurses signature  ________________________  Post Anesthesia Care Unit [phone]    Post Anesthesia Home Care Instructions  Activity: Get plenty of rest for the remainder of the day. A responsible adult should stay with you for 24 hours following the procedure.  For the next 24 hours, DO NOT: -Drive a car -Paediatric nurse -Drink alcoholic beverages -Take any medication unless instructed by your physician -Make any legal decisions or sign important papers.  Meals: Start with liquid foods such as gelatin or soup. Progress to regular foods as tolerated. Avoid greasy, spicy, heavy foods. If nausea and/or vomiting occur, drink only clear liquids until the nausea and/or vomiting subsides. Call your physician if vomiting continues.  Special Instructions/Symptoms: Your throat may feel dry or sore from the anesthesia or the breathing tube placed in your throat during surgery. If this causes discomfort, gargle with warm salt water. The discomfort should disappear within 24 hours.  If you had a scopolamine patch placed behind your ear for the management of post- operative nausea and/or vomiting:  1. The medication in the patch is effective for 72 hours, after which it should be removed.  Wrap patch in a tissue and discard in the trash. Wash hands thoroughly with soap and water. 2. You may remove the patch earlier than 72 hours if you experience unpleasant side effects which may include dry mouth, dizziness or visual disturbances. 3. Avoid touching the patch. Wash your hands with soap and water after contact with the patch.

## 2016-06-03 NOTE — Op Note (Signed)
06/03/2016  12:15 PM  PATIENT:  Connie Cole  45 y.o. female  PRE-OPERATIVE DIAGNOSIS:  Failed NovaSure Ablation, Menorrhagia  POST-OPERATIVE DIAGNOSIS:  Failed NovaSure Ablation, Menorrhagia  PROCEDURE:  Procedure(s): DILATATION & CURETTAGE/HYSTEROSCOPY WITH HYDROTHERMAL ABLATION  SURGEON:  Surgeon(s): Brien Few, MD  ASSISTANTS: none   ANESTHESIA:   local and general  ESTIMATED BLOOD LOSS: minimal  DRAINS: none   LOCAL MEDICATIONS USED:  MARCAINE    and Amount: 20 ml  SPECIMEN:  No Specimen  DISPOSITION OF SPECIMEN:  N/A  COUNTS:  YES  DICTATION #: [id number]  PLAN OF CARE: dc home  PATIENT DISPOSITION:  PACU - hemodynamically stable.

## 2016-06-04 ENCOUNTER — Encounter (HOSPITAL_COMMUNITY): Payer: Self-pay | Admitting: Obstetrics and Gynecology

## 2016-06-07 NOTE — Op Note (Signed)
NAMEMCKINZEY, STURCH               ACCOUNT NO.:  [id number]  MEDICAL RECORD NO.:  [id number]  LOCATION:                                FACILITY:  [facility]  PHYSICIAN:  Lovenia Kim, M.D.DATE OF BIRTH:  June 02, 1971  DATE OF PROCEDURE:  06/03/2016 DATE OF DISCHARGE:  06/03/2016                              OPERATIVE REPORT   PREOPERATIVE DIAGNOSIS:  Failed endometrial ablation, failed NovaSure ablation with refractory menometrorrhagia.  POSTOPERATIVE DIAGNOSIS:  Failed endometrial ablation, failed NovaSure ablation with refractory menometrorrhagia.  PROCEDURE:  Hydrothermal ablation (HTA), diagnostic hysteroscopy.  SURGEON:  Lovenia Kim, M.D.  ASSISTANT:  None.  ANESTHESIA:  General and local.  ESTIMATED BLOOD LOSS:  Less than 50 mL.  FLUID DEFICIT:  Less than 50 mL.  COMPLICATIONS:  None.  DRAINS:  Foley.  COUNTS:  Correct.  DISPOSITION:  The patient to recovery in good condition.  BRIEF OPERATIVE NOTE:  After being apprised of risks of anesthesia, infection, bleeding, injury to surrounding organs, possible need for repair, delayed versus immediate complications to include bowel and bladder injury, possible need for repair, inability to predict future bleeding, the patient was brought to the operating room.  She was administered general anesthetic without complications.  Prepped and draped in usual sterile fashion.  Catheterized until the bladder was empty.  Exam under anesthesia revealed a bulky anteflexed uterus and no adnexal masses.  At this time, cervix was easily dilated up to a #24 Pratt dilator.  Hysteroscope placed.  Visualization revealed an otherwise ablated endometrial cavity with visualization of the entire cavity not seen.  After achieving good seal, proceeded to the 90 degree temperature and the ablation was initiated for the proper time after cooling.  The device was removed.  Good hemostasis was noted.  Dilute Marcaine solution placed per  cervical block.  The patient tolerated the procedure well, was awakened and transferred to recovery in good condition.     Lovenia Kim, M.D.     RJT/MEDQ  D:  06/03/2016  T:  06/04/2016  Job:  [id number]

## 2016-08-11 ENCOUNTER — Other Ambulatory Visit: Payer: Self-pay | Admitting: Obstetrics and Gynecology

## 2016-08-11 DIAGNOSIS — D251 Intramural leiomyoma of uterus: Secondary | ICD-10-CM

## 2016-08-17 ENCOUNTER — Other Ambulatory Visit: Payer: Managed Care, Other (non HMO)

## 2016-09-06 ENCOUNTER — Ambulatory Visit
Admission: RE | Admit: 2016-09-06 | Discharge: 2016-09-06 | Disposition: A | Discharge status: Home / Self Care | Payer: Managed Care, Other (non HMO) | Source: Ambulatory Visit | Attending: Obstetrics and Gynecology | Admitting: Obstetrics and Gynecology

## 2016-09-06 DIAGNOSIS — D251 Intramural leiomyoma of uterus: Secondary | ICD-10-CM

## 2016-09-06 HISTORY — PX: IR RADIOLOGIST EVAL & MGMT: IMG5224

## 2016-09-06 NOTE — Consult Note (Signed)
Chief Complaint: Menorrhagia  Referring Physician(s): Taavon,Richard  History of Present Illness: Connie Cole is a 46 y.o. (G2, P0) female with past history significant for arthritis and GERD who presents to the interventional radiology clinic for evauation of symptomatic menorrhagia.  The patient is unaccompanied and serves as her own historian.  Patient reports a long history of menorhaia for which she has undergone 2 previous endometrial ablations, most recently, approximately 3 months ago. Patient states that the most recent endometrial ablation has done little to alleviate her menorrhagia. Patient states that the first 1-2 days of her cycle are associated with heavy menorrhagia including the changing of both pads and tampons every hour. Her cycles last approximate 5-7 days. Patient denies intraprocedural spotting though admits to daily watery vaginal discharge.  Patient also admits to bloating and urinary frequency though is otherwise without bulk related symptoms. No fever or chills.  The patient denies perimenopausal symptoms.  The patient states she is also to undergo a surgery regarding a rectal prolapse and is uncertain she also once to undergo a hysterectomy at this time. The patient does not desire any children.  Past Medical History:  Diagnosis Date  . Arthritis    hands, hips  . GERD (gastroesophageal reflux disease)    occasional diet controlled- no med  . Palpitations    wore heart monitor x 1 wk, unknown - stress related    Past Surgical History:  Procedure Laterality Date  . COLONOSCOPY     polyp  . DILITATION & CURRETTAGE/HYSTROSCOPY WITH HYDROTHERMAL ABLATION N/A 06/03/2016   Procedure: DILATATION & CURETTAGE/HYSTEROSCOPY WITH HYDROTHERMAL ABLATION;  Surgeon: Brien Few, MD;  Location: [facility];  Service: Gynecology;  Laterality: N/A;  . TONSILLECTOMY      Allergies: Latex  Medications: Prior to Admission medications   Medication Sig Start Date  End Date Taking? Authorizing Provider  Cranberry 500 MG CAPS Take 500 mg by mouth daily.   Yes Historical Provider, MD  Javier Docker Oil 500 MG CAPS Take 500 mg by mouth daily.   Yes Historical Provider, MD  lactobacillus acidophilus (BACID) TABS tablet Take 3 tablets by mouth daily.   Yes Historical Provider, MD  Misc Natural Products (OSTEO BI-FLEX ADV DOUBLE ST) TABS Take 2 tablets by mouth daily.   Yes Historical Provider, MD  Multiple Vitamin (MULTIVITAMIN WITH MINERALS) TABS tablet Take 1 tablet by mouth daily.   Yes Historical Provider, MD  traMADol (ULTRAM) 50 MG tablet Take 1-2 tablets (50-100 mg total) by mouth every 6 (six) hours as needed for moderate pain. Patient not taking: Reported on 09/06/2016 06/03/16   Brien Few, MD     Family History  Problem Relation Age of Onset  . Stroke Mother   . Heart disease Mother   . Hypertension Mother   . Cancer Father     eye  . Healthy Sister     Social History   Social History  . Marital status: Married    Spouse name: N/A  . Number of children: N/A  . Years of education: N/A   Social History Main Topics  . Smoking status: Former Smoker    Packs/day: 0.50    Years: 5.00    Types: Cigarettes    Quit date: 06/27/2000  . Smokeless tobacco: Never Used  . Alcohol use 1.2 oz/week    2 Shots of liquor per week  . Drug use: No  . Sexual activity: Yes    Birth control/ protection: None   Other Topics Concern  .  Not on file   Social History Narrative  . No narrative on file    ECOG Status: 1 - Symptomatic but completely ambulatory  Review of Systems: A 12 point ROS discussed and pertinent positives are indicated in the HPI above.  All other systems are negative.  Review of Systems  Constitutional: Negative for activity change, appetite change, fatigue, fever and unexpected weight change.  Respiratory: Negative.   Cardiovascular: Negative.   Gastrointestinal:       Patient admits to occasional abdominal bloating. She is  otherwise without complaint.  Genitourinary: Positive for frequency, menstrual problem, vaginal bleeding and vaginal discharge.  Musculoskeletal:       Chronic history of hip pain, right greater than left.    Vital Signs: Ht 5\' 3"  (1.6 m)   Wt 130 lb (59 kg)   LMP 09/01/2016   BMI 23.03 kg/m   Physical Exam  Constitutional: She appears well-developed.  HENT:  Head: Normocephalic and atraumatic.  Cardiovascular: Normal rate, regular rhythm and intact distal pulses.   Palpable right common femoral and dorsalis pedis arterial pulses.  Pulmonary/Chest: Effort normal and breath sounds normal.  Abdominal: Soft. Bowel sounds are normal.  I am unable to palpate her uterine fundus.  Skin: Skin is warm and dry.  Psychiatric: She has a normal mood and affect. Her behavior is normal.  Nursing note and vitals reviewed.   Imaging:  Hip MRI - 03/21/2016 - Evaluation was not tailor for the evaluation of the uterus though there appears to be thickening of the endometrium as could be seen in the setting of adenomyosis.   Labs:  CBC:  Recent Labs  12/23/15 0926 06/03/16 0945  WBC 6.3 6.3  HGB 12.3 13.7  HCT 38.2 40.2  PLT 345 327    COAGS: No results for input(s): INR, APTT in the last 8760 hours.  BMP:  Recent Labs  12/23/15 0926  NA 136  K 4.2  CL 102  CO2 23  GLUCOSE 93  BUN 12  CALCIUM 9.5  CREATININE 0.71    LIVER FUNCTION TESTS:  Recent Labs  12/23/15 0926  BILITOT 0.3  AST 15  ALT 13  ALKPHOS 35  PROT 7.0  ALBUMIN 4.4    Assessment and Plan:  Connie Cole is a 46 y.o. (G2, P0) female with past history significant for arthritis and GERD who presents to the interventional radiology clinic for evauation of symptomatic menorrhagia.    At the present time, I do not have documented history of the patient having uterine fibroids. Rather, hip MRI performed 03/21/2016 demonstrates apparent thickening of the endometrial lining as can be seen in setting of  adenomyosis, though admittedly, the hip MRI was not tailored for the evaluation of the uterus.  As such, I explained to the patient that if we were to pursue uterine fibroid embolization, a contrast enhanced pelvic MRI will be performed to better delineate the size and location of potential uterine fibroids as well as to ensure fibroid enhancement. I also explained to the patient that uterine fibroid embolization could be performed in the setting of adenomyosis however embolization is slightly less efficacious in this setting.  Assuming her menorrhagia is attributable to fibroids, potential treatment options including continued conservative management and hysterectomy were discussed with the patient.  At the present time, the patient is also considering undergoing rectal prolapse repair and is uncertain whether she would like to also proceed with hysterectomy at that time.  As such, prolonged conversations were held with the  patient regarding the benefits and risks (including but not limited to vessel injury, nontarget embolization, radiation and contrast exposure and infection) of uterine fibroid embolization.  Additionally, I explained to the patient that I am uncertain whether an embolization would do anything to alleviate her complaint of daily watery vaginal discharge  Following this prolonged and detailed conversation, the patient wishes to discuss her persistent vaginal watery discharge with Dr. Ronita Hipps prior to making her ultimate decision.  The patient will call the interventional radiology clinic to discuss her ultimate plan of action in the next 2-3 weeks.  If uterine embolization is pursued, we willl arrange for an endometrial biopsy to be be performed and we will arrange to obtain a contrast enhanced pelvic MRI.  Ultimately, the uterine fibroid embolization will be performed at [facility]. The procedure will entail an overnight admission for continued observation and PCA  usage.  The patient was encouraged to call the interventional radiology clinic with any interval questions or concerns.  Thank you for this interesting consult.  I greatly enjoyed meeting Connie Cole and look forward to participating in their care.  A copy of this report was sent to the requesting provider on this date.  Electronically Signed: Sandi Mariscal 09/06/2016, 4:28 PM   I spent a total of 30 Minutes in face to face in clinical consultation, greater than 50% of which was counseling/coordinating care for Menorrhagia

## 2016-12-12 ENCOUNTER — Encounter: Payer: Self-pay | Admitting: Interventional Radiology

## 2017-04-21 LAB — HEPATIC FUNCTION PANEL
ALT: 32 (ref 7–35)
AST: 24 (ref 13–35)
Alkaline Phosphatase: 42 (ref 25–125)
Bilirubin, Total: 0.2

## 2017-04-21 LAB — BASIC METABOLIC PANEL
BUN: 10 (ref 4–21)
Creatinine: 0.7 (ref 0.5–1.1)
Glucose: 85
Potassium: 4 (ref 3.4–5.3)
Sodium: 141 (ref 137–147)

## 2017-05-15 ENCOUNTER — Telehealth: Payer: Self-pay | Admitting: Family Medicine

## 2017-05-15 NOTE — Telephone Encounter (Signed)
Pt called while office clsd for lunch left message--- cld patient back no answer/left message at [phone]

## 2017-05-16 ENCOUNTER — Encounter: Payer: Self-pay | Admitting: Family Medicine

## 2017-05-16 ENCOUNTER — Ambulatory Visit (INDEPENDENT_AMBULATORY_CARE_PROVIDER_SITE_OTHER): Payer: BLUE CROSS/BLUE SHIELD | Admitting: Family Medicine

## 2017-05-16 VITALS — BP 108/74 | HR 80 | Ht 63.0 in | Wt 134.7 lb

## 2017-05-16 DIAGNOSIS — Z823 Family history of stroke: Secondary | ICD-10-CM | POA: Diagnosis not present

## 2017-05-16 DIAGNOSIS — F430 Acute stress reaction: Secondary | ICD-10-CM | POA: Insufficient documentation

## 2017-05-16 DIAGNOSIS — R0789 Other chest pain: Secondary | ICD-10-CM | POA: Diagnosis not present

## 2017-05-16 DIAGNOSIS — F438 Other reactions to severe stress: Secondary | ICD-10-CM

## 2017-05-16 DIAGNOSIS — R200 Anesthesia of skin: Secondary | ICD-10-CM | POA: Diagnosis not present

## 2017-05-16 DIAGNOSIS — F4389 Other reactions to severe stress: Secondary | ICD-10-CM | POA: Insufficient documentation

## 2017-05-16 DIAGNOSIS — R202 Paresthesia of skin: Secondary | ICD-10-CM | POA: Diagnosis not present

## 2017-05-16 DIAGNOSIS — F411 Generalized anxiety disorder: Secondary | ICD-10-CM | POA: Insufficient documentation

## 2017-05-16 MED ORDER — BUSPIRONE HCL 15 MG PO TABS: 15.0000 mg | ORAL_TABLET | Freq: Three times a day (TID) | ORAL | 0 refills | Status: DC

## 2017-05-16 MED ORDER — SERTRALINE HCL 50 MG PO TABS: 50.0000 mg | ORAL_TABLET | Freq: Every day | ORAL | 0 refills | Status: DC

## 2017-05-16 NOTE — Progress Notes (Signed)
Pt here for an acute care OV today   Impression and Recommendations:    1. Acute reaction to situational stress   2. GAD (generalized anxiety disorder)   3. Family history of stroke (cerebrovascular)- mother 30's   4. Stress-related physiological response affecting physical condition   5. Numbness and tingling in both arms/ hands   6. Non-cardiac chest pain- assoc with emotional stress     - start sertraline and buspar- see AVS for details d/c pt on how to start them - relaxation tech - counseling HIGHLY REC - exercise, sleep hygeine, meditation/prayer etc d/c pt  - seroquel and ativan-   per the Dr. at [facility].  CMA updated med list.    Told pt I WILL NOT RF these meds.    Explained to pt she cannot maintain two PCP's though.     Lab Orders     Comprehensive metabolic panel     Lipid panel     Hemoglobin A1c     VITAMIN D 25 Hydroxy (Vit-D Deficiency, Fractures)     Vitamin B12     TSH     T4, free     Phosphorus     Magnesium     CBC with Differential/Platelet     T3, free  - Pt was in the office today for 40+ minutes, with over 50% time spent in face to face counseling of patient regarding her physical symptoms of stress, panic attacks and generalized anxiety disorder.   We had extensive discussion about sleep hygiene, medications to help that and discuss meditation etc.-please see AVS for details.   Had extensive discussion with patient regarding chronic stress and physical symptoms of it.   Red flag symptoms discussed and if she develops any of those, follow-up needed.  including medicine management and lifestyle modification, strategies to improve health and well being; and in coordination of care.  SEE ABOVE FOR DETAILS   Education and routine counseling performed. Handouts provided  Gross side effects, risk and benefits, and alternatives of medications and treatment plan in general discussed with patient.  Patient is aware that all medications have potential side  effects and we are unable to predict every side effect or drug-drug interaction that may occur.   Patient will call with any questions prior to using medication if they have concerns.  Expresses verbal understanding and consents to current therapy and treatment regimen.  No barriers to understanding were identified.  Red flag symptoms and signs discussed in detail.  Patient expressed understanding regarding what to do in case of emergency\urgent symptoms  Please see AVS handed out to patient at the end of our visit for further patient instructions/ counseling done pertaining to today's office visit.   Return for 3-4 wks f/up new meds.- Buspar and Zoloft and review labs     Note: This document was prepared using Dragon voice recognition software and may include unintentional dictation errors.  Connie Cole 6:12 PM --------------------------------------------------------------------------------------------------------------------------------------------------------------------------------------------------------------------------------------------    Subjective:    CC:  Chief Complaint  Patient presents with  . Other    hands blue, pain in arms and hands    HPI: Connie Cole is a 46 y.o. female who presents to [place] at [facility] today for issues as discussed below.  Recently seen by IM at [facility]- Dr Alphonsus Sias- 2-3 wks ago.  Because she was not sleeping well. Gave her sleep meds  -  Acutely here today b/c got tense couple days  ago--> got pain all over body- down both arms and into hands and into fingers- got pains all over body.  Went numb and even blue colored as pt got upset emotionally over something.  She saw chiropractor earlier today- felt better after adjustment.   - Significant physical sx of GAD:  Found out yesterday about her dad's cancer treatments are not effective and she had a bout of chest pain and chest tightness spell.  Felt  difficult to catch a deep breath and felt like her chest was constricted.  She took some [place] John's wort medicine over-the-counter which did help her mellow out and this morning she took it again.    Wt Readings from Last 3 Encounters:  05/16/17 134 lb 11.2 oz (61.1 kg)  09/06/16 130 lb (59 kg)  05/26/16 134 lb (60.8 kg)   BP Readings from Last 3 Encounters:  05/16/17 108/74  06/03/16 104/60  01/18/16 114/71   BMI Readings from Last 3 Encounters:  05/16/17 23.86 kg/m  09/06/16 23.03 kg/m  05/26/16 23.93 kg/m     Patient Care Team    Relationship Specialty Notifications Start End  Connie Dance, DO PCP - General Family Medicine  12/10/15   Brien Few, MD Consulting Physician Obstetrics and Gynecology  05/16/17   Merrilee Seashore, MD Consulting Physician Internal Medicine  05/16/17    Comment: Actually sees Dr. Felicie Morn at [facility]      Patient Active Problem List   Diagnosis Date Noted  . Acute reaction to situational stress 05/16/2017    Priority: High  . GAD (generalized anxiety disorder) 05/16/2017    Priority: High  . Stress-related physiological response affecting physical condition 05/16/2017    Priority: Medium  . GERD (gastroesophageal reflux disease) 12/16/2015    Priority: Medium  . Tinea corporis (h/o tinea versicolor per pt) 12/20/2015    Priority: Low  . Numbness and tingling in both arms/ hands 05/16/2017  . Non-cardiac chest pain- assoc with emotional stress 05/16/2017  . Palpitations 01/18/2016  . Family history of stroke (cerebrovascular)- mother 31's 01/18/2016  . Family history of atrial fibrillation 01/18/2016  . Seborrheic dermatitis of scalp 12/16/2015    Social History   Socioeconomic History  . Marital status: Married    Spouse name: None  . Number of children: None  . Years of education: None  . Highest education level: None  Social Needs  . Financial resource strain: None  . Food insecurity - worry: None    . Food insecurity - inability: None  . Transportation needs - medical: None  . Transportation needs - non-medical: None  Occupational History  . None  Tobacco Use  . Smoking status: Former Smoker    Packs/day: 0.50    Years: 5.00    Pack years: 2.50    Types: Cigarettes    Last attempt to quit: 06/27/2000    Years since quitting: 16.8  . Smokeless tobacco: Never Used  Substance and Sexual Activity  . Alcohol use: Yes    Alcohol/week: 1.2 oz    Types: 2 Shots of liquor per week  . Drug use: No  . Sexual activity: Yes    Birth control/protection: None  Other Topics Concern  . None  Social History Narrative  . None    Past Surgical History:  Procedure Laterality Date  . COLONOSCOPY     polyp  . DILITATION & CURRETTAGE/HYSTROSCOPY WITH HYDROTHERMAL ABLATION N/A 06/03/2016   Procedure: DILATATION & CURETTAGE/HYSTEROSCOPY WITH HYDROTHERMAL ABLATION;  Surgeon: Brien Few,  MD;  Location: [facility];  Service: Gynecology;  Laterality: N/A;  . IR RADIOLOGIST EVAL & MGMT  09/06/2016  . TONSILLECTOMY      Past Medical history, Surgical history, Family history, Social history, Allergies and Medications have been entered into the medical record, reviewed and changed as needed.    Current Meds  Medication Sig  . Krill Oil 500 MG CAPS Take 500 mg by mouth daily.  . QUEtiapine (SEROQUEL) 25 MG tablet Take 25 mg by mouth at bedtime.  . [DISCONTINUED] LORazepam (ATIVAN) 1 MG tablet Take 1 mg by mouth every 8 (eight) hours.    Allergies:  Allergies  Allergen Reactions  . Latex Rash     Review of Systems: General:   Denies fever, chills, unexplained weight loss.  Optho/Auditory:   Denies visual changes, blurred vision/LOV Respiratory:   Denies wheeze, DOE more than baseline levels.  Cardiovascular:   Denies chest pain, palpitations, new onset peripheral edema  Gastrointestinal:   Denies nausea, vomiting, diarrhea, abd pain.  Genitourinary: Denies dysuria, freq/ urgency, flank  pain or discharge from genitals.  Endocrine:     Denies hot or cold intolerance, polyuria, polydipsia. Musculoskeletal:   Denies unexplained myalgias, joint swelling, unexplained arthralgias, gait problems.  Skin:  Denies new onset rash, suspicious lesions Neurological:     Denies dizziness, unexplained weakness, numbness  Psychiatric/Behavioral:   Denies mood changes, suicidal or homicidal ideations, hallucinations    Objective:   Blood pressure 108/74, pulse 80, height 5\' 3"  (1.6 m), weight 134 lb 11.2 oz (61.1 kg), last menstrual period 05/09/2017. Body mass index is 23.86 kg/m. General:  Well Developed, well nourished, appropriate for stated age.  Neuro:  Alert and oriented,  extra-ocular muscles intact  HEENT:  Normocephalic, atraumatic, neck supple Skin:  no gross rash, warm, pink. Cardiac:  RRR, S1 S2, no TTP Respiratory:  ECTA B/L and A/P, Not using accessory muscles, speaking in full sentences- unlabored. Vascular:  Ext warm, no cyanosis apprec.; cap RF less 2 sec. Psych:  No HI/SI, judgement and insight good, Nervous, rambling, diff to redirect

## 2017-05-16 NOTE — Patient Instructions (Signed)
Cut the sertraline in half and take it daily after a large meal of the day.   -Also with the BuSpar start 3-4 days after you start the sertraline because we only want to start one new med at a time case her side effect.  Then start with half of the BuSpar 3 times a day as tolerated.  May increase to 1 tab 3 times a day for acute panic, acute physical symptoms of stress etc.    I want you to do square breathing 4-5 times per day.  10 square breathing each time.   -Also we need to transition your brain into thinking more positively.  These tasks below are some things I want you to do every day 1)  write 3 new things that you are grateful for every day for 21 days  2)  exercise daily- walk for 15 minutes twice a day every day 3)  you are going to journal every day about one positive experience that you had 4)  meditate every day.  You can go on YouTube and look for 15-minute relaxation meditation or what ever.  But we need to make sure that you are in the moment and relaxing and deep breathing every day 5)  Write 1 positive email every day to praise someone in your life     - If you have insomnia or difficulty sleeping, this information is for you:  - Avoid caffeinated beverages after lunch,  no alcoholic beverages,  no eating within 2-3 hours of lying down,  avoid exposure to blue light before bed,  avoid daytime naps, and  needs to maintain a regular sleep schedule- go to sleep and wake up around the same time every night.   - Resolve concerns or worries before entering bedroom:  Discussed relaxation techniques with patient and to keep a journal to write down fears\ worries.  I suggested seeing a counselor for CBT.   - Recommend patient meditate or do deep breathing exercises to help relax.   Incorporate the use of white noise machines or listen to "sleep meditation music", or recordings of guided meditations for sleep from YouTube which are free, such as  "guided meditation for  detachment from over thinking"  by Mayford Knife.        What is Chronic Stress Syndrome, Symptoms & Ways to Deal With it   What is Chronic Stress Syndrome?  Chronic Stress Syndrome is something which can now be called as a medical condition due to the amount of stress an individual is going through these days. Chronic Stress Syndrome causes the body and mind to shutdown and the person has no control over himself or herself. Due to the demands of modern day life and the hardship throughout day and night takes its toll over a period of time and the body and brain starts demanding rest and a break. This leads to certain symptoms where your performance level starts to dip at work, you become irritable both at work and at home, you may stop enjoying activities you previously liked, you may become depressed, you may get angry for even small things. Chronic Stress Syndrome can significantly impact your quality life. Thus it is important understand the symptoms of Chronic Stress Syndrome and react accordingly in order to cope up with it.  It is important to note here that a balanced work-home equation should be drawn to cut down symptoms of Chronic Stress Syndrome. Minor stressors can be overcome by the body's inbuilt stress  response but when there is unending stress for a long period of time then an external help is required to ease the stress.  Chronic Stress Syndrome can physically and psychologically drain you over a period of time. For such cases stress management is the best way to cope up with Chronic Stress Syndrome. If Chronic Stress Syndrome is not treated then it may result in many health hazards like anxiety, muscle pain, insomnia, and high blood pressure along with a compromised immune system leading to frequent infections and missed days from work.    What are the Symptoms of Chronic Stress Syndrome?   The symptoms of Chronic Stress Syndrome are variable and range from generalized  symptoms to emotional symptoms along with behavioral and cognitive symptoms. Some of these symptoms have been delineated below:  Generalized Symptoms of Chronic Stress Syndrome are: Anxiety Depression Social isolation Headache Abdominal pain Lack of sleep Back pain Difficulty in concentrating Hypertension Hemorrhoids Varicose veins Panic attacks/ Panic disorder Cardiovascular diseases.   Some of the Emotional Symptoms of Chronic Stress Syndrome are: To become easily agitated, moody and frustrated Feeling overwhelmed which makes you feel like you are losing control. Having difficulty relaxing and have a peaceful mind Having low self esteem Feeling lonely Feeling worthless Feeling depressed Avoiding social environment.   Some of the Physical Symptoms of Chronic Stress Syndrome are: Headaches Lethargy Alternating diarrhea and constipation Nausea Muscles aches and pains Insomnia Rapid heartbeat and chest pain Infections and frequent colds Decreased libido Nervousness and shaking Tinnitus Sweaty palms Dry mouth Clenched jaw.  Some of the Cognitive Symptoms of Chronic Stress Syndrome are: Constant worrying Racing thoughts Disorganization and forgetfulness Inability to focus Poor judgment Abundance of negativity.  Some of the Behavioral Symptoms of Chronic Stress Syndrome are: Changes in appetite with less desire to eat Avoiding responsibilities Indulgence in alcohol or recreational drug use Increased nail biting and being fidgety Ways to Deal With Chronic Stress Syndrome    Chronic Stress Syndrome is not something which cannot be addressed. A bit of effort from your side in the form of lifestyle modifications, a little bit of exercise, a balanced work life equation can do wonders and help you get rid of Chronic Stress Syndrome.  Get Proper Sleep: It has been proved that Chronic Stress Syndrome causes loss of sleep where an individual may not even be able to  sleep for days unending. This may result in the individual feeling lethargic and unable to focus at work the following morning. This may lead to decreased performance at work. Thus, it is important to have a good sleep-wake cycle. For this, try and not drink any caffeinated beverage about four hours prior to going to sleep, as caffeine pumps up the adrenaline and causes you to stay awake resulting ultimately in Chronic Stress Syndrome.  Avoid Alcohol and Drugs: Another way to get rid of Chronic Stress Syndrome is lifestyle modifications. Stay away from alcohol and other recreational drugs. Take Short Frequent Breaks at Work: Try to take frequent breaks from work and do not work continuously. Try and manage your work in such a way that you even meet your deadline and come home on time for a happy dinner with family. A good time spent with family and kids does wonders in not only dealing with Chronic Stress Syndrome but also preventing it.  Become Physically Active: Another step towards getting rid of Chronic Stress Syndrome is physical activity. If you do not have time to spend at the gym then  at least try and go for daily walks for about half an hour a day which not only keeps the stress away but also is good for your overall health. Physical activity leads to production of endorphins which will make you feel relaxed and feel good.  Healthy Diet Can Help You Deal With Chronic Stress Syndrome: Have a balanced and healthy diet is another step towards a stress free life and keeping Chronic Stress Syndrome at [place]. If time is a constraint then you can try eating three small meals a day. Try and avoid fast foods and take foods which are healthy and rich in proteins, fiber, and carbohydrates to boost your energy system.  Music Can Soothe Your Mind: Light music is one of the best and most effective relaxation techniques that one can try to overcome stress. It has shown to calm down the mind and take you away from  all the stressors that you may be having. These days it is also being used as a therapy in some institutes for overcoming stress. It is important here to discuss the importance of a good social support system for patients with Chronic Stress Syndrome, as a good social support framework can do wonders in taking the stress away from the patient and overcoming Chronic Stress Syndrome.  Meditation Can Help You Deal With Chronic Stress Syndrome Effectively: Meditation and yoga has also shown to be quite effective in relaxing the mind and coping up with Chronic Stress Syndrome   In cases where these measures are not helpful, then it is time for you to consult with a skilled psychologist or a psychiatrist for potential therapies or medications to control the stress response.   The psychologist can help you with a variety of steps for coping up with Chronic Stress Syndrome. Relaxation techniques and behavioral therapy are some of the methods employed by psychologists. In some cases, medications can also be given to help relax the patient.  Since Chronic Stress Syndrome is both emotionally and physically draining for the patient and it also adversely affects the family life of the patient hence it is important for the patient to recognize the condition and taking steps to cope up with it. Escaping measures like alcohol and drug use are of no help as they only aggravate the condition apart from their other health hazards. If this condition is ignored or left untreated it can lead to various medical conditions like anxiety and depression and various other medical conditions.  Last but not least, smile as often as you can as it is the best gift that you can give to someone. The best way to stay relaxed is to have a good smile, exercise daily, spend time with your family, meditation and if required consultation with a good psychologist so that you can live a stress free life and overcome the symptoms of Chronic Stress  Syndrome.

## 2017-06-05 ENCOUNTER — Ambulatory Visit: Payer: PRIVATE HEALTH INSURANCE | Admitting: Family Medicine

## 2017-06-05 ENCOUNTER — Telehealth: Payer: Self-pay

## 2017-06-05 NOTE — Telephone Encounter (Signed)
I called pt, I advised her that [facility] is closed on 06/06/2017. Pt is agreeable to r/s her appt to Friday, 06/09/17 at 9:00am. Pt verbalized understanding of new appt date and time.

## 2017-06-05 NOTE — Telephone Encounter (Signed)
I called pt, no answer, left a message advising her that our office will be closed tomorrow 06/06/17 and to please call us back during business hours.

## 2017-06-06 ENCOUNTER — Institutional Professional Consult (permissible substitution): Payer: Self-pay | Admitting: Neurology

## 2017-06-09 ENCOUNTER — Institutional Professional Consult (permissible substitution): Payer: Self-pay | Admitting: Neurology

## 2017-08-02 ENCOUNTER — Encounter: Payer: Self-pay | Admitting: Neurology

## 2017-08-02 ENCOUNTER — Ambulatory Visit (INDEPENDENT_AMBULATORY_CARE_PROVIDER_SITE_OTHER): Payer: BLUE CROSS/BLUE SHIELD | Admitting: Neurology

## 2017-08-02 VITALS — BP 109/75 | HR 83 | Ht 63.0 in | Wt 135.0 lb

## 2017-08-02 DIAGNOSIS — R0683 Snoring: Secondary | ICD-10-CM

## 2017-08-02 DIAGNOSIS — R351 Nocturia: Secondary | ICD-10-CM

## 2017-08-02 DIAGNOSIS — G479 Sleep disorder, unspecified: Secondary | ICD-10-CM

## 2017-08-02 NOTE — Progress Notes (Signed)
Subjective:    Patient ID: Connie Cole is a 47 y.o. female.  HPI     Star Age, MD, PhD [facility] [address], [address] [address] [place], [place] [address]  Dear Dr. Ashby Dawes,  I saw your patient, Connie Cole, unaccompanied request in my neurologic clinic today for initial consultation of her sleep disorder, including difficulty going to sleep and staying asleep and snoring. The patient is unaccompanied today. As you know, Connie Cole is a 47 yo woman with an underlying medical history of anxiety, reflux disease, and history of pituitary microadenoma, who reports a nearly 20 year history of sleep difficulty, her mother has similar difficulty.  She reports difficulty initiating and maintaining sleep. She has tried over-the-counter medications such as Benadryl and has been on prescription medications including Ativan, hydroxyzine, trazodone, amitriptyline, and more recently Seroquel. None of these help.  She admits, the utilized alcohol at night for insomnia. She admits that she has had 2 vodka and OTC sleep aids. She started using alcohol at night in order to sleep some 15 years ago. Also, some 15 years ago or so she tried her mother's Ambien and had drowsiness from it also. Side effects as her mother could not tolerate it very well. Her Epworth Sleepiness Scale score is 1 out of 24, fatigue score is 53 out of 63. She denies I reviewed your office note from 05/01/2017. She saw Dr. Raliegh Scarlet on 05/16/2017. She was started on sertraline and Buspar at the time. She was advised to seek counseling for her stress and anxiety. She has not started the BuSpar as yet. She has been on sertraline for the past 5 weeks or so and feels that it has helped for her anxiety and depression. She was also told to consider cognitive behavioral therapy for insomnia but has not pursued it. She denies any restless leg symptoms. Her mother may have restless leg syndrome. She has worried  about her parents' health. She had some stress with her 58 year old stepdaughter recently. Currently, she lives with her husband, they have 2 cats, she tries to have good sleep hygiene, does not watch TV in her bed, tries to read. She has tried relaxation and meditation but is too high strung for this she admits. She was quite hyperactive as a child. She still feels like she has too much energy and cannot sit still. She admits that she has rumination of thought at night.  Her Past Medical History Is Significant For: Past Medical History:  Diagnosis Date  . Arthritis    hands, hips  . GERD (gastroesophageal reflux disease)    occasional diet controlled- no med  . Palpitations    wore heart monitor x 1 wk, unknown - stress related    Her Past Surgical History Is Significant For: Past Surgical History:  Procedure Laterality Date  . COLONOSCOPY     polyp  . DILITATION & CURRETTAGE/HYSTROSCOPY WITH HYDROTHERMAL ABLATION N/A 06/03/2016   Procedure: DILATATION & CURETTAGE/HYSTEROSCOPY WITH HYDROTHERMAL ABLATION;  Surgeon: Brien Few, MD;  Location: [facility];  Service: Gynecology;  Laterality: N/A;  . IR RADIOLOGIST EVAL & MGMT  09/06/2016  . TONSILLECTOMY      Her Family History Is Significant For: Family History  Problem Relation Age of Onset  . Stroke Mother   . Heart disease Mother   . Hypertension Mother   . Cancer Father        eye  . Healthy Sister     Her Social History Is  Significant For: Social History   Socioeconomic History  . Marital status: Married    Spouse name: None  . Number of children: None  . Years of education: None  . Highest education level: None  Social Needs  . Financial resource strain: None  . Food insecurity - worry: None  . Food insecurity - inability: None  . Transportation needs - medical: None  . Transportation needs - non-medical: None  Occupational History  . None  Tobacco Use  . Smoking status: Former Smoker    Packs/day: 0.50     Years: 5.00    Pack years: 2.50    Types: Cigarettes    Last attempt to quit: 06/27/2000    Years since quitting: 17.1  . Smokeless tobacco: Never Used  Substance and Sexual Activity  . Alcohol use: Yes    Alcohol/week: 1.2 oz    Types: 2 Shots of liquor per week  . Drug use: No  . Sexual activity: Yes    Birth control/protection: None  Other Topics Concern  . None  Social History Narrative  . None    Her Allergies Are:  Allergies  Allergen Reactions  . Latex Rash  :   Her Current Medications Are:  Outpatient Encounter Medications as of 08/02/2017  Medication Sig  . busPIRone (BUSPAR) 15 MG tablet Take 1 tablet (15 mg total) by mouth 3 (three) times daily.  Connie Cole Oil 500 MG CAPS Take 500 mg by mouth daily.  . sertraline (ZOLOFT) 50 MG tablet Take 1 tablet (50 mg total) by mouth daily.  . [DISCONTINUED] QUEtiapine (SEROQUEL) 25 MG tablet Take 25 mg by mouth at bedtime.   No facility-administered encounter medications on file as of 08/02/2017.   :  Review of Systems:  Out of a complete 14 point review of systems, all are reviewed and negative with the exception of these symptoms as listed below: Review of Systems  Neurological:       Pt presents today to discuss her sleep. Pt has never had a sleep study and does endorse occasional snoring.  Epworth Sleepiness Scale 0= would never doze 1= slight chance of dozing 2= moderate chance of dozing 3= high chance of dozing  Sitting and reading: 0 Watching TV: 0 Sitting inactive in a public place (ex. Theater or meeting): 0 As a passenger in a car for an hour without a break: 0 Lying down to rest in the afternoon: 1 Sitting and talking to someone: 0 Sitting quietly after lunch (no alcohol): 0 In a car, while stopped in traffic: 0 Total: 1     Objective:  Neurological Exam  Physical Exam Physical Examination:   Vitals:   08/02/17 1536  BP: 109/75  Pulse: 83    General Examination: The patient is a very pleasant  47 y.o. female in no acute distress. She appears well-developed and well-nourished and well groomed.   HEENT: Normocephalic, atraumatic, pupils are equal, round and reactive to light and accommodation. Funduscopic exam is normal with sharp disc margins noted. Extraocular tracking is good without limitation to gaze excursion or nystagmus noted. Normal smooth pursuit is noted. Hearing is grossly intact. Tympanic membranes are clear bilaterally. Face is symmetric with normal facial animation and normal facial sensation. Speech is clear with no dysarthria noted. There is no hypophonia. There is no lip, neck/head, jaw or voice tremor. Neck is supple with full range of passive and active motion. There are no carotid bruits on auscultation. Oropharynx exam reveals: no mouth dryness,  good dental hygiene and no significant airway crowding. Uvula is small, tonsils are absent. Mallampati is class I. Neck circumference is slender.  Chest: Clear to auscultation without wheezing, rhonchi or crackles noted.  Heart: S1+S2+0, regular and normal without murmurs, rubs or gallops noted.   Abdomen: Soft, non-tender and non-distended with normal bowel sounds appreciated on auscultation.  Extremities: There is no pitting edema in the distal lower extremities bilaterally. Pedal pulses are intact.  Skin: Warm and dry without trophic changes noted.  Musculoskeletal: exam reveals no obvious joint deformities, tenderness or joint swelling or erythema.   Neurologically:  Mental status: The patient is awake, alert and oriented in all 4 spheres. Her immediate and remote memory, attention, language skills and fund of knowledge are appropriate. There is no evidence of aphasia, agnosia, apraxia or anomia. Speech is clear with normal prosody and enunciation. Thought process is linear. Mood is normal and affect is normal. Slightly pressured speech. Cranial nerves II - XII are as described above under HEENT exam. In addition: shoulder  shrug is normal with equal shoulder height noted. Motor exam: Normal bulk, strength and tone is noted. There is no drift, tremor or rebound. Romberg is negative. Reflexes are 2+ throughout. Fine motor skills and coordination: intact with normal finger taps, normal hand movements, normal rapid alternating patting, normal foot taps and normal foot agility.  Cerebellar testing: No dysmetria or intention tremor. There is no truncal or gait ataxia.  Sensory exam: intact to light touch in the upper and lower extremities.  Gait, station and balance: She stands easily. No veering to one side is noted. No leaning to one side is noted. Posture is age-appropriate and stance is narrow based. Gait shows normal stride length and normal pace. No problems turning are noted. Tandem walk is unremarkable.   Assessment and Plan:  In summary, Connie Cole is a very pleasant 47 y.o.-year old female with an underlying medical history of anxiety, reflux disease, and history of pituitary microadenoma, who presents for a sleep consultation for a long-standing history of sleep difficulties including difficulty going to sleep and maintaining sleep for over 20 years. She has tried multiple over-the-counter medications and prescription medications. She is highly cautioned about utilizing alcohol for sleep, this can put her arm swing and potentially be very dangerous. She is quite receptive to this and has on her own try to scale back on her alcohol intake in the past for 5 months. She has recently been started on Zoloft for anxiety and depression which has been helpful. She may benefit from counseling. She is encouraged to talk to Dr. Raliegh Scarlet about potentially pursuing cognitive behavioral therapy.  She is reminded to keep a good sleep hygiene. She is furthermore advised that we can try to pursue a sleep study to rule out an organic cause of her sleep difficulties. We will call her with her test results and take it from there. Thank  you very much for allowing me to participate in the care of this nice patient. If I can be of any further assistance to you please do not hesitate to call me at [phone].  Sincerely,   Star Age, MD, PhD

## 2017-08-02 NOTE — Patient Instructions (Addendum)
Based on your symptoms and your exam I believe we should look for an underlying organic sleep disorder, such as obstructive sleep apnea/OSA, or dream enactments, or periodic leg movement disorder (PLMD), which is leg twitching in sleep.   Please do not drive if you feel sleepy.   At this point, we should proceed with a sleep study to further delineate your sleep-related issues.   If you have more than mild OSA, I want you to consider treatment with CPAP. Please remember, the risks and ramifications of moderate to severe obstructive sleep apnea or OSA are: Cardiovascular disease, including congestive heart failure, stroke, difficult to control hypertension, arrhythmias, and even type 2 diabetes has been linked to untreated OSA. Sleep apnea causes disruption of sleep and sleep deprivation in most cases, which, in turn, can cause recurrent headaches, problems with memory, mood, concentration, focus, and vigilance. Most people with untreated sleep apnea report excessive daytime sleepiness, which can affect their ability to drive.    I will likely see you back after your sleep study to go over the test results and where to go from there. We will call you after your sleep study to advise about the results (most likely, you will hear from [place], my nurse) and to set up an appointment at the time.    Our sleep lab administrative assistant will call you to schedule your sleep study. If you don't hear back from her by about 2 weeks from now, please feel free to call her at [phone]. This is her direct line and please leave a message with your phone number to call back if you get the voicemail box. She will call back as soon as possible.   Ultimately, your chronic insomnia may be best treated with cognitive behavioral therapy (CBT-I).  You can talk to your primary care physician about this, some local psychology practices offer CBT. Please bring this up with Dr. Raliegh Scarlet.  Please do not utilize alcohol as a  sleep aid!   Please remember to try to maintain good sleep hygiene, which means: Keep a regular sleep and wake schedule, try not to exercise or have a meal within 2 hours of your bedtime, try to keep your bedroom conducive for sleep, that is, cool and dark, without light distractors such as an illuminated alarm clock, and refrain from watching TV right before sleep or in the middle of the night and do not keep the TV or radio on during the night. Also, try not to use or play on electronic devices at bedtime, such as your cell phone, tablet PC or laptop. If you like to read at bedtime on an electronic device, try to dim the background light as much as possible. Do not eat in the middle of the night.

## 2017-08-08 ENCOUNTER — Other Ambulatory Visit: Payer: Self-pay

## 2017-08-08 ENCOUNTER — Telehealth: Payer: Self-pay | Admitting: Family Medicine

## 2017-08-08 DIAGNOSIS — F411 Generalized anxiety disorder: Secondary | ICD-10-CM

## 2017-08-08 MED ORDER — SERTRALINE HCL 50 MG PO TABS: 50.0000 mg | ORAL_TABLET | Freq: Every day | ORAL | 0 refills | Status: DC

## 2017-08-08 NOTE — Telephone Encounter (Signed)
Patient called requesting refill on Zoloft.  Reviewed chart patient is due for follow up, sent in 30 days and patient to be contacted to make appointment. MPulliam, CMA/RT(R)

## 2017-08-08 NOTE — Telephone Encounter (Signed)
Patient is requesting a refill of the zoloft. If approved please sent to [facility] on [address]

## 2017-08-09 ENCOUNTER — Telehealth: Payer: Self-pay | Admitting: Family Medicine

## 2017-08-09 NOTE — Telephone Encounter (Signed)
Left patient message to call office to set up provider required OV for Rx refills. --glh

## 2017-08-23 ENCOUNTER — Ambulatory Visit
Admission: RE | Admit: 2017-08-23 | Discharge: 2017-08-23 | Disposition: A | Discharge status: Home / Self Care | Payer: PRIVATE HEALTH INSURANCE | Source: Ambulatory Visit | Attending: Physical Medicine & Rehabilitation | Admitting: Physical Medicine & Rehabilitation

## 2017-08-23 ENCOUNTER — Other Ambulatory Visit: Payer: Self-pay | Admitting: Physical Medicine & Rehabilitation

## 2017-08-23 DIAGNOSIS — M169 Osteoarthritis of hip, unspecified: Secondary | ICD-10-CM

## 2017-09-05 ENCOUNTER — Other Ambulatory Visit (INDEPENDENT_AMBULATORY_CARE_PROVIDER_SITE_OTHER): Payer: BLUE CROSS/BLUE SHIELD

## 2017-09-05 ENCOUNTER — Ambulatory Visit (INDEPENDENT_AMBULATORY_CARE_PROVIDER_SITE_OTHER): Payer: BLUE CROSS/BLUE SHIELD | Admitting: Neurology

## 2017-09-05 DIAGNOSIS — F4389 Other reactions to severe stress: Secondary | ICD-10-CM

## 2017-09-05 DIAGNOSIS — R200 Anesthesia of skin: Secondary | ICD-10-CM

## 2017-09-05 DIAGNOSIS — R0683 Snoring: Secondary | ICD-10-CM

## 2017-09-05 DIAGNOSIS — G478 Other sleep disorders: Secondary | ICD-10-CM

## 2017-09-05 DIAGNOSIS — R0789 Other chest pain: Secondary | ICD-10-CM

## 2017-09-05 DIAGNOSIS — G479 Sleep disorder, unspecified: Secondary | ICD-10-CM

## 2017-09-05 DIAGNOSIS — F438 Other reactions to severe stress: Secondary | ICD-10-CM

## 2017-09-05 DIAGNOSIS — F430 Acute stress reaction: Secondary | ICD-10-CM

## 2017-09-05 DIAGNOSIS — R351 Nocturia: Secondary | ICD-10-CM

## 2017-09-05 DIAGNOSIS — Z823 Family history of stroke: Secondary | ICD-10-CM

## 2017-09-05 DIAGNOSIS — R202 Paresthesia of skin: Secondary | ICD-10-CM

## 2017-09-05 DIAGNOSIS — G4720 Circadian rhythm sleep disorder, unspecified type: Secondary | ICD-10-CM

## 2017-09-06 ENCOUNTER — Encounter: Payer: Self-pay | Admitting: Family Medicine

## 2017-09-06 ENCOUNTER — Ambulatory Visit (INDEPENDENT_AMBULATORY_CARE_PROVIDER_SITE_OTHER): Payer: BLUE CROSS/BLUE SHIELD | Admitting: Family Medicine

## 2017-09-06 VITALS — BP 106/69 | HR 80 | Ht 63.0 in | Wt 136.4 lb

## 2017-09-06 DIAGNOSIS — F430 Acute stress reaction: Secondary | ICD-10-CM | POA: Diagnosis not present

## 2017-09-06 DIAGNOSIS — F411 Generalized anxiety disorder: Secondary | ICD-10-CM | POA: Diagnosis not present

## 2017-09-06 DIAGNOSIS — G479 Sleep disorder, unspecified: Secondary | ICD-10-CM | POA: Diagnosis not present

## 2017-09-06 DIAGNOSIS — M159 Polyosteoarthritis, unspecified: Secondary | ICD-10-CM

## 2017-09-06 DIAGNOSIS — E039 Hypothyroidism, unspecified: Secondary | ICD-10-CM

## 2017-09-06 DIAGNOSIS — F438 Other reactions to severe stress: Secondary | ICD-10-CM | POA: Diagnosis not present

## 2017-09-06 DIAGNOSIS — F5101 Primary insomnia: Secondary | ICD-10-CM | POA: Insufficient documentation

## 2017-09-06 DIAGNOSIS — F4389 Other reactions to severe stress: Secondary | ICD-10-CM

## 2017-09-06 DIAGNOSIS — E038 Other specified hypothyroidism: Secondary | ICD-10-CM

## 2017-09-06 DIAGNOSIS — Z87898 Personal history of other specified conditions: Secondary | ICD-10-CM

## 2017-09-06 LAB — CBC WITH DIFFERENTIAL/PLATELET
Basophils Absolute: 0 10*3/uL (ref 0.0–0.2)
Basos: 1 %
EOS (ABSOLUTE): 0.2 10*3/uL (ref 0.0–0.4)
EOS: 5 %
HEMATOCRIT: 36.8 % (ref 34.0–46.6)
HEMOGLOBIN: 12.3 g/dL (ref 11.1–15.9)
IMMATURE GRANS (ABS): 0 10*3/uL (ref 0.0–0.1)
IMMATURE GRANULOCYTES: 0 %
LYMPHS: 34 %
Lymphocytes Absolute: 1.6 10*3/uL (ref 0.7–3.1)
MCH: 33 pg (ref 26.6–33.0)
MCHC: 33.4 g/dL (ref 31.5–35.7)
MCV: 99 fL — ABNORMAL HIGH (ref 79–97)
MONOCYTES: 10 %
MONOS ABS: 0.5 10*3/uL (ref 0.1–0.9)
NEUTROS PCT: 50 %
Neutrophils Absolute: 2.3 10*3/uL (ref 1.4–7.0)
Platelets: 308 10*3/uL (ref 150–379)
RBC: 3.73 x10E6/uL — AB (ref 3.77–5.28)
RDW: 12.1 % — AB (ref 12.3–15.4)
WBC: 4.6 10*3/uL (ref 3.4–10.8)

## 2017-09-06 LAB — LIPID PANEL
Chol/HDL Ratio: 2.1 ratio (ref 0.0–4.4)
Cholesterol, Total: 178 mg/dL (ref 100–199)
HDL: 86 mg/dL (ref >39)
LDL Calculated: 74 mg/dL (ref 0–99)
Triglycerides: 90 mg/dL (ref 0–149)
VLDL Cholesterol Cal: 18 mg/dL (ref 5–40)

## 2017-09-06 LAB — PHOSPHORUS: PHOSPHORUS: 3.7 mg/dL (ref 2.5–4.5)

## 2017-09-06 LAB — MAGNESIUM: Magnesium: 2 mg/dL (ref 1.6–2.3)

## 2017-09-06 LAB — TSH: TSH: 5.05 u[IU]/mL — AB (ref 0.450–4.500)

## 2017-09-06 LAB — VITAMIN D 25 HYDROXY (VIT D DEFICIENCY, FRACTURES): VIT D 25 HYDROXY: 42.5 ng/mL (ref 30.0–100.0)

## 2017-09-06 LAB — T4, FREE: FREE T4: 0.93 ng/dL (ref 0.82–1.77)

## 2017-09-06 LAB — HEMOGLOBIN A1C
ESTIMATED AVERAGE GLUCOSE: 91 mg/dL
HEMOGLOBIN A1C: 4.8 % (ref 4.8–5.6)

## 2017-09-06 LAB — VITAMIN B12: Vitamin B-12: 769 pg/mL (ref 232–1245)

## 2017-09-06 LAB — T3, FREE: T3, Free: 2.8 pg/mL (ref 2.0–4.4)

## 2017-09-06 MED ORDER — MELOXICAM 15 MG PO TABS: 15.0000 mg | ORAL_TABLET | Freq: Every day | ORAL | 0 refills | Status: DC

## 2017-09-06 MED ORDER — SERTRALINE HCL 50 MG PO TABS: 50.0000 mg | ORAL_TABLET | Freq: Every day | ORAL | 0 refills | Status: DC

## 2017-09-06 MED ORDER — AMITRIPTYLINE HCL 25 MG PO TABS: ORAL_TABLET | ORAL | 1 refills | Status: DC

## 2017-09-06 NOTE — Patient Instructions (Signed)
Patient stated she was late for another appointment even though we were on time, declined AVS

## 2017-09-06 NOTE — Progress Notes (Signed)
Impression and Recommendations:    1. Acute reaction to situational stress   2. GAD (generalized anxiety disorder)   3. Stress-related physiological response affecting physical condition   4. H/O: pituitary tumor   5. Sleeping difficulty   6. Subclinical hypothyroidism   7. Generalized osteoarthritis of multiple sites     1. Acute reaction to situational stress 2. GAD- continue zoloft. 3. Stress-related physiological response affecting physical condition -continue zoloft. -pt reports feeling much improved in her mood after starting this medication. She is going through a difficult time with her sick father but is emotionally under control and blunted. -refill zoloft  4. H/o pituitary tumor- -add prolactin today to recent labs.  Order placed  5. Sleeping difficulty -start elavil, after long discussion patient regarding risks and benefits of medication -take your medicines around 9 and 9:30pm before going to bed at 10:30. Discussed proper sleep hygiene.  6. Subclinical hypothyroidism-  -Without sx of hypothyroidism, will continue to monitor. Explained TSH only slightly elevated and will continue to monitor.  -TSH slightly elevated. Individual T3 and T4 results WNL.   7. Generalized OA -refill mobic  -refill meds per pt request    -discussed in detail all recent blood work that was done in office recently with the patient today.  Today patient tell me that new history of having a pituitary tumor and she requested a prolactin level.  Explained if it comes back abnormal, she will need to follow-up with endocrinology regarding this.  -Separate note sent to CMA's to make sure that prolactin level is added on to patient's labs   Orders Placed This Encounter  Procedures  . Prolactin    Meds ordered this encounter  Medications  . meloxicam (MOBIC) 15 MG tablet    Sig: Take 1 tablet (15 mg total) by mouth daily.    Dispense:  90 tablet    Refill:  0  . sertraline  (ZOLOFT) 50 MG tablet    Sig: Take 1 tablet (50 mg total) by mouth daily.    Dispense:  90 tablet    Refill:  0  . amitriptyline (ELAVIL) 25 MG tablet    Sig: 1 tab at bedtime as needed for sleep    Dispense:  90 tablet    Refill:  1    Gross side effects, risk and benefits, and alternatives of medications and treatment plan in general discussed with patient.  Patient is aware that all medications have potential side effects and we are unable to predict every side effect or drug-drug interaction that may occur.   Patient will call with any questions prior to using medication if they have concerns.  Expresses verbal understanding and consents to current therapy and treatment regimen.  No barriers to understanding were identified.  Red flag symptoms and signs discussed in detail.  Patient expressed understanding regarding what to do in case of emergency\urgent symptoms  Please see AVS handed out to patient at the end of our visit for further patient instructions/ counseling done pertaining to today's office visit.   Return in about 4 months (around 01/06/2018) for Follow-up 4 months for mood.    Note: This note was prepared with assistance of Dragon voice recognition software. Occasional wrong-word or sound-a-like substitutions may have occurred due to the inherent limitations of voice recognition software.  This document serves as a record of services personally performed by [name], DO. It was created on her behalf by Mayer Masker, a trained medical scribe.  The creation of this record is based on the scribe's personal observations and the provider's statements to them.   I have reviewed the above medical documentation for accuracy and completeness and I concur.  [name] 09/06/17 7:00  PM   ----------------------------------------------------------------------------------------------------------------------------------------------------------------------------------------------------------------------------  Subjective:     HPI: Connie Cole is a 47 y.o. female who presents to [place] at [facility] today for issues as discussed below including mood and sleep disturbances.  Mood She states she is good considering her dad's poor health, she thinks he is going to pass soon. He has hospice coming to his house. She has a slightly blunted response and does not get emotionally overwhelmed by this.   She has stopped drinking alcohol for about 4 weeks. She feels more energetic.   She has not taken her buspar because she has not needed it.   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    Thyroid She has a benign pituitary thyroid gland tumor.    Diet/exercise She has been cheating on her diet a little bit, she states.  She reports her HDL result was 144 on 12-23-15. This was because of being on the [place] program.   Sleep She had a sleep study last night at [facility], but has issues sleeping. She reports difficulty sleeping during her sleep study last night. She will not have results for 1 week.   She takes 2 unisom and CBD oil, which is a hit or miss, but allows her sleep at times. She does not want to try ambien due to SE. She would like to try lunesta.  She has not tried elavil. She states one day she had a few drinks and had sleep disturbances the next few days after that.     Wt Readings from Last 3 Encounters:  09/06/17 136 lb 6.4 oz (61.9 kg)  08/02/17 135 lb (61.2 kg)  05/16/17 134 lb 11.2 oz (61.1 kg)   BP Readings from Last 3 Encounters:  09/06/17 106/69  08/02/17 109/75  05/16/17 108/74   Pulse Readings from Last 3 Encounters:  09/06/17 80  08/02/17 83  05/16/17 80   BMI Readings from Last 3  Encounters:  09/06/17 24.16 kg/m  08/02/17 23.91 kg/m  05/16/17 23.86 kg/m     Patient Care Team    Relationship Specialty Notifications Start End  [name], DO PCP - General Family Medicine  12/10/15   Brien Few, MD Consulting Physician Obstetrics and Gynecology  05/16/17   Merrilee Seashore, MD Consulting Physician Internal Medicine  05/16/17    Comment: Actually sees Dr. Felicie Morn at [facility]      Patient Active Problem List   Diagnosis Date Noted  . Acute reaction to situational stress 05/16/2017    Priority: High  . GAD (generalized anxiety disorder) 05/16/2017    Priority: High  . Stress-related physiological response affecting physical condition 05/16/2017    Priority: Medium  . GERD (gastroesophageal reflux disease) 12/16/2015    Priority: Medium  . Tinea corporis (h/o tinea versicolor per pt) 12/20/2015    Priority: Low  . H/O: pituitary tumor 09/06/2017  . Sleeping difficulty 09/06/2017  . Subclinical hypothyroidism 09/06/2017  . Generalized osteoarthritis of multiple sites 09/06/2017  . Numbness and tingling in both arms/ hands 05/16/2017  . Non-cardiac chest pain- assoc with emotional stress 05/16/2017  . Palpitations 01/18/2016  . Family history of stroke (cerebrovascular)- mother 45's 01/18/2016  . Family history of atrial  fibrillation 01/18/2016  . Seborrheic dermatitis of scalp 12/16/2015    Past Medical history, Surgical history, Family history, Social history, Allergies and Medications have been entered into the medical record, reviewed and changed as needed.    Current Meds  Medication Sig  . Krill Oil 500 MG CAPS Take 500 mg by mouth daily.  . meloxicam (MOBIC) 15 MG tablet Take 1 tablet (15 mg total) by mouth daily.  . sertraline (ZOLOFT) 50 MG tablet Take 1 tablet (50 mg total) by mouth daily.  . [DISCONTINUED] meloxicam (MOBIC) 15 MG tablet Take 1 tablet by mouth daily.  . [DISCONTINUED] sertraline (ZOLOFT) 50 MG  tablet Take 1 tablet (50 mg total) by mouth daily.    Allergies:  Allergies  Allergen Reactions  . Latex Rash    Review of Systems:  A fourteen system review of systems was performed and found to be positive as per HPI.   Objective:   Blood pressure 106/69, pulse 80, height 5\' 3"  (1.6 m), weight 136 lb 6.4 oz (61.9 kg), SpO2 98 %. Body mass index is 24.16 kg/m. General:  Well Developed, well nourished, appropriate for stated age.  Neuro:  Alert and oriented,  extra-ocular muscles intact  HEENT:  Normocephalic, atraumatic, neck supple, no carotid bruits appreciated  Skin:  no gross rash, warm, pink. Cardiac:  RRR, S1 S2 Respiratory:  ECTA B/L and A/P, Not using accessory muscles, speaking in full sentences- unlabored. Vascular:  Ext warm, no cyanosis apprec.; cap RF less 2 sec. Psych:  No HI/SI, judgement and insight good, Euthymic mood. Full Affect.

## 2017-09-08 ENCOUNTER — Other Ambulatory Visit: Payer: Self-pay

## 2017-09-08 DIAGNOSIS — R7989 Other specified abnormal findings of blood chemistry: Secondary | ICD-10-CM

## 2017-09-08 DIAGNOSIS — Z87898 Personal history of other specified conditions: Secondary | ICD-10-CM

## 2017-09-08 DIAGNOSIS — E229 Hyperfunction of pituitary gland, unspecified: Secondary | ICD-10-CM

## 2017-09-08 NOTE — Progress Notes (Signed)
Prolactin level elevated. As I told patient during recent office visit pituitary tumors and their treatment is not something I would follow but she would need to see endocrinology for her TSH abnormality and elevated prolactin level. Please put in a referral with 1) elevated TSH and 2) elevated prolactin level with 3) history of pituitary tumor as the diagnoses. Thanks   -  Per note form Dr. Raliegh Scarlet. MPulliam, CMA/RT(R)

## 2017-09-11 ENCOUNTER — Telehealth: Payer: Self-pay

## 2017-09-11 NOTE — Progress Notes (Signed)
Patient referred by Dr. Ashby Dawes (but I believe, she now FU with Dr. Raliegh Scarlet only), seen by me on 08/02/17, diagnostic PSG on 09/05/17.   Please call and notify the patient that the recent sleep study did not show any significant obstructive sleep apnea or other organic sleep d/o. She did not sleep great, but once she fell asleep she achieved all stages of sleep. Please inform patient that she can FU with Dr. Raliegh Scarlet. As discussed, for chronic insomnia, cognitive behavioral therapy (CBT-I) should be considered and can be very helpful. She is encouraged to d/w PCP. I can see her back prn.  We had talked about the dangers of utilizing alcohol as a sleep aid at length during our visit. I would like to re-iterate that.  Thanks,  Star Age, MD, PhD [facility])

## 2017-09-11 NOTE — Telephone Encounter (Signed)
I called pt and explained her sleep study results. Pt will discuss with Dr. Raliegh Scarlet CBT-I. I reiterated that pt should avoid using alcohol as as sleep aid. Pt asked that I send a copy of her results to Dr. Raliegh Scarlet. Pt verbalized understanding of results. Pt had no questions at this time but was encouraged to call back if questions arise.

## 2017-09-11 NOTE — Telephone Encounter (Signed)
-----   Message from Star Age, MD sent at 09/11/2017  8:35 AM EDT ----- Patient referred by Dr. Ashby Dawes (but I believe, she now FU with Dr. Raliegh Scarlet only), seen by me on 08/02/17, diagnostic PSG on 09/05/17.   Please call and notify the patient that the recent sleep study did not show any significant obstructive sleep apnea or other organic sleep d/o. She did not sleep great, but once she fell asleep she achieved all stages of sleep. Please inform patient that she can FU with Dr. Raliegh Scarlet. As discussed, for chronic insomnia, cognitive behavioral therapy (CBT-I) should be considered and can be very helpful. She is encouraged to d/w PCP. I can see her back prn.  We had talked about the dangers of utilizing alcohol as a sleep aid at length during our visit. I would like to re-iterate that.  Thanks,  Star Age, MD, PhD [facility])

## 2017-09-11 NOTE — Procedures (Signed)
PATIENT'S NAME:  Connie Cole, Connie Cole DOB:      May 28, 1971      MR#:    [id number]     DATE OF RECORDING: 09/05/2017 REFERRING M.D.: Dr. Roselle Locus Study Performed:   Baseline Polysomnogram HISTORY: 47 year old woman with a history of anxiety, reflux disease, and history of pituitary microadenoma, who reports a nearly 20 year history of sleep difficulty, including difficulty initiating and maintaining sleep. The patient endorsed the Epworth Sleepiness Scale at 1/24 points. The patient's weight 135 pounds with a height of 63 (inches), resulting in a BMI of 23.8 kg/m2. The patient's neck circumference measured 16 inches.  CURRENT MEDICATIONS: Buspirone, Krill Oil and Sertraline   PROCEDURE:  This is a multichannel digital polysomnogram utilizing the Somnostar 11.2 system.  Electrodes and sensors were applied and monitored per AASM Specifications.   EEG, EOG, Chin and Limb EMG, were sampled at 200 Hz.  ECG, Snore and Nasal Pressure, Thermal Airflow, Respiratory Effort, CPAP Flow and Pressure, Oximetry was sampled at 50 Hz. Digital video and audio were recorded.      BASELINE STUDY  Lights Out was at 22:54 and Lights On at 05:19.  Total recording time (TRT) was 385.5 minutes, with a total sleep time (TST) of 296.5 minutes.   The patient's sleep latency was 78.5 minutes, which is markedly delayed.  REM latency was 235 minutes, which is markedly delayed. The sleep efficiency was 76.9%, which is reduced.     SLEEP ARCHITECTURE: WASO (Wake after sleep onset) was 35.5 minutes with mild sleep fragmentation noted.  There were 27 minutes in Stage N1, 205 minutes Stage N2, 34.5 minutes Stage N3 and 30 minutes in Stage REM.  The percentage of Stage N1 was 9.1%, which is increased, Stage N2 was 69.1%, which is increased, Stage N3 was 11.6%, which is mildly reduced and Stage R (REM sleep) was 10.1%, which is reduced. The arousals were noted as: 37 were spontaneous, 0 were associated with PLMs, 3 were associated with  respiratory events.  Audio and video analysis did not show any abnormal or unusual movements, behaviors, phonations or vocalizations. The patient took 2 bathroom breaks. No significant snoring was noted. The EKG was in keeping with normal sinus rhythm (NSR).  RESPIRATORY ANALYSIS:  There were a total of 3 respiratory events:  0 obstructive apneas, 0 central apneas and 0 mixed apneas with a total of 0 apneas and an apnea index (AI) of 0 /hour. There were 3 hypopneas with a hypopnea index of .6 /hour. The patient also had 0 respiratory event related arousals (RERAs).      The total APNEA/HYPOPNEA INDEX (AHI) was .6/hour and the total RESPIRATORY DISTURBANCE INDEX was .6 /hour.  1 events occurred in REM sleep and 4 events in NREM. The REM AHI was 2 /hour, versus a non-REM AHI of .5. The patient spent 296.5 minutes of total sleep time in the supine position and 0 minutes in non-supine.. The supine AHI was 0.6 versus a non-supine AHI of 0.0.  OXYGEN SATURATION & C02:  The Wake baseline 02 saturation was 92%, with the lowest being 86%. Time spent below 89% saturation equaled 0 minutes.  PERIODIC LIMB MOVEMENTS: The patient had a total of 0 Periodic Limb Movements.  The Periodic Limb Movement (PLM) index was 0 and the PLM Arousal index was 0/hour.  Post-study, the patient indicated that sleep was the same as usual.   IMPRESSION:  1. Sleep disturbance 2. Dysfunctions associated with sleep stages or arousal from sleep  RECOMMENDATIONS:  1. This study does not demonstrate any significant obstructive or central sleep disordered breathing. This study does not support an intrinsic sleep disorder as a cause of the patient's symptoms. Other causes, including circadian rhythm disturbances, an underlying mood disorder, medication effect and/or an underlying medical problem cannot be ruled out. 2. This study shows sleep fragmentation and abnormal sleep stage percentages; these are nonspecific findings and per se  do not signify an intrinsic sleep disorder or a cause for the patient's sleep-related symptoms. Causes include (but are not limited to) the first night effect of the sleep study, circadian rhythm disturbances, medication effect or an underlying mood disorder or medical problem.  3. The patient should be cautioned not to drive, work at heights, or operate dangerous or heavy equipment when tired or sleepy. Review and reiteration of good sleep hygiene measures should be pursued with any patient. 4. For chronic insomnia, cognitive behavioral therapy (CBT-I) should be considered.  5. The patient will be seen in follow-up by Dr. Rexene Alberts at [facility] for discussion of the test results and further management strategies. The referring provider will be notified of the test results.   I certify that I have reviewed the entire raw data recording prior to the issuance of this report in accordance with the Standards of Accreditation of the American Academy of Sleep Medicine (AASM)    Star Age, MD, PhD Diplomat, American Board of Psychiatry and Neurology (Neurology and Sleep Medicine)

## 2017-09-13 LAB — SPECIMEN STATUS REPORT

## 2017-09-13 LAB — PROLACTIN: PROLACTIN: 42.3 ng/mL — AB (ref 4.8–23.3)

## 2017-10-30 DIAGNOSIS — M6289 Other specified disorders of muscle: Secondary | ICD-10-CM | POA: Diagnosis not present

## 2017-10-30 DIAGNOSIS — K623 Rectal prolapse: Secondary | ICD-10-CM | POA: Diagnosis not present

## 2017-11-06 ENCOUNTER — Encounter: Payer: Self-pay | Admitting: Endocrinology

## 2017-11-07 ENCOUNTER — Emergency Department (HOSPITAL_COMMUNITY)
Admission: EM | Admit: 2017-11-07 | Discharge: 2017-11-07 | Disposition: A | Discharge status: Home / Self Care | Payer: BLUE CROSS/BLUE SHIELD | Attending: Emergency Medicine | Admitting: Emergency Medicine

## 2017-11-07 ENCOUNTER — Encounter (HOSPITAL_COMMUNITY): Payer: Self-pay | Admitting: Emergency Medicine

## 2017-11-07 ENCOUNTER — Telehealth: Payer: Self-pay | Admitting: Family Medicine

## 2017-11-07 ENCOUNTER — Other Ambulatory Visit: Payer: Self-pay

## 2017-11-07 ENCOUNTER — Emergency Department (HOSPITAL_COMMUNITY): Payer: BLUE CROSS/BLUE SHIELD

## 2017-11-07 DIAGNOSIS — Z79899 Other long term (current) drug therapy: Secondary | ICD-10-CM | POA: Diagnosis not present

## 2017-11-07 DIAGNOSIS — Z87891 Personal history of nicotine dependence: Secondary | ICD-10-CM | POA: Diagnosis not present

## 2017-11-07 DIAGNOSIS — Z91040 Latex allergy status: Secondary | ICD-10-CM | POA: Diagnosis not present

## 2017-11-07 DIAGNOSIS — R51 Headache: Secondary | ICD-10-CM | POA: Diagnosis not present

## 2017-11-07 DIAGNOSIS — R202 Paresthesia of skin: Secondary | ICD-10-CM | POA: Insufficient documentation

## 2017-11-07 DIAGNOSIS — R0602 Shortness of breath: Secondary | ICD-10-CM | POA: Diagnosis not present

## 2017-11-07 DIAGNOSIS — R200 Anesthesia of skin: Secondary | ICD-10-CM | POA: Insufficient documentation

## 2017-11-07 LAB — RAPID URINE DRUG SCREEN, HOSP PERFORMED
Amphetamines: NOT DETECTED
Barbiturates: NOT DETECTED
Benzodiazepines: NOT DETECTED
COCAINE: NOT DETECTED
OPIATES: NOT DETECTED
Tetrahydrocannabinol: NOT DETECTED

## 2017-11-07 LAB — PROTIME-INR
INR: 1.02
Prothrombin Time: 13.3 seconds (ref 11.4–15.2)

## 2017-11-07 LAB — COMPREHENSIVE METABOLIC PANEL
ALT: 15 U/L (ref 14–54)
AST: 23 U/L (ref 15–41)
Albumin: 4.4 g/dL (ref 3.5–5.0)
Alkaline Phosphatase: 43 U/L (ref 38–126)
Anion gap: 11 (ref 5–15)
BILIRUBIN TOTAL: 0.6 mg/dL (ref 0.3–1.2)
BUN: 13 mg/dL (ref 6–20)
CO2: 26 mmol/L (ref 22–32)
CREATININE: 0.67 mg/dL (ref 0.44–1.00)
Calcium: 9.7 mg/dL (ref 8.9–10.3)
Chloride: 102 mmol/L (ref 101–111)
Glucose, Bld: 94 mg/dL (ref 65–99)
POTASSIUM: 3.6 mmol/L (ref 3.5–5.1)
Sodium: 139 mmol/L (ref 135–145)
TOTAL PROTEIN: 7.3 g/dL (ref 6.5–8.1)

## 2017-11-07 LAB — I-STAT TROPONIN, ED: TROPONIN I, POC: 0 ng/mL (ref 0.00–0.08)

## 2017-11-07 LAB — DIFFERENTIAL
ABS IMMATURE GRANULOCYTES: 0 10*3/uL (ref 0.0–0.1)
BASOS PCT: 1 %
Basophils Absolute: 0.1 10*3/uL (ref 0.0–0.1)
EOS ABS: 0.1 10*3/uL (ref 0.0–0.7)
Eosinophils Relative: 2 %
Immature Granulocytes: 0 %
LYMPHS ABS: 1.1 10*3/uL (ref 0.7–4.0)
Lymphocytes Relative: 13 %
MONO ABS: 0.7 10*3/uL (ref 0.1–1.0)
MONOS PCT: 8 %
NEUTROS PCT: 76 %
Neutro Abs: 6 10*3/uL (ref 1.7–7.7)

## 2017-11-07 LAB — APTT: aPTT: 32 seconds (ref 24–36)

## 2017-11-07 LAB — CBC
HCT: 38.7 % (ref 36.0–46.0)
Hemoglobin: 13 g/dL (ref 12.0–15.0)
MCH: 33.7 pg (ref 26.0–34.0)
MCHC: 33.6 g/dL (ref 30.0–36.0)
MCV: 100.3 fL — AB (ref 78.0–100.0)
PLATELETS: 336 10*3/uL (ref 150–400)
RBC: 3.86 MIL/uL — AB (ref 3.87–5.11)
RDW: 11.9 % (ref 11.5–15.5)
WBC: 8 10*3/uL (ref 4.0–10.5)

## 2017-11-07 LAB — I-STAT BETA HCG BLOOD, ED (MC, WL, AP ONLY): I-stat hCG, quantitative: <5 m[IU]/mL (ref <5)

## 2017-11-07 LAB — ETHANOL

## 2017-11-07 NOTE — Telephone Encounter (Signed)
Called patient, no answer.  Reviewed chart patient went to the ED and is still there at the moment. MPulliam, CMA/RT(R)

## 2017-11-07 NOTE — ED Provider Notes (Signed)
Patient placed in Quick Look pathway, seen and evaluated   Chief Complaint: R ear numbness  HPI:   47 year old female with chronic neck pain presents with right ear numbness.  She states that this morning at 9:00 she woke up and felt some slight aching over the right side of her neck and numbness over the right ear and around the right ear.  She has never felt the symptoms before.  She became worried about her symptoms because her mom has had a stroke and so she came to the ED.  She also has had numbness and tingling in both of her fingers and toes but states that she is very anxious.  She denies headache, vision changes, facial numbness, dizziness, unilateral weakness.  ROS: +R ear numbness  -Headache  Physical Exam:   Gen: No distress  Neuro: Awake and Alert  Skin: Warm    Focused Exam: Sitting in chair in NAD. GCS 15. Speaks in a clear voice. Decreased sensation over the right ear. TM is intact. Otherwise cranial nerves II through XII grossly intact. 5/5 strength in all extremities. Sensation fully intact.  Bilateral finger-to-nose intact. Ambulatory   Initiation of care has begun. The patient has been counseled on the process, plan, and necessity for staying for the completion/evaluation, and the remainder of the medical screening examination    Recardo Evangelist, PA-C 11/07/17 1624    Carmin Muskrat, MD 11/08/17 1850

## 2017-11-07 NOTE — ED Triage Notes (Signed)
Pt states she woke up this morning with right ear and right side of the face numbness. Pt thought it was a pinched nerve. When driving to chiropractor her right fingertips became tingling and the right foot became numb. Pt states she did have an anxiety attack. Did not take her meds for this. Pt states hand and foot feel better than earlier but still feel different. Pt states hx of nerve and chiropractic issues. Pt states familial hx of TIA.

## 2017-11-07 NOTE — Telephone Encounter (Signed)
Patient called states she has been having some symptoms of numbness in Rt ear / slight facial , foot, toes tingling  & numbness wonder if she should come to PCP to get checked out.  ---Patient concerned  & also thinks it maybe just a nerve issue.(advised pt provider unavailable, strongly advised to seek immediate medical attention at local Urgent Care or Emergency Dept.)  Forwarding message to medical assistant to call pt back at [phone].  --glh

## 2017-11-08 ENCOUNTER — Telehealth: Payer: Self-pay | Admitting: Family Medicine

## 2017-11-08 NOTE — Telephone Encounter (Signed)
Patient is requesting a refill of her amitriptyline and sertraline. If approved please send to [facility] on [address].

## 2017-11-09 ENCOUNTER — Other Ambulatory Visit: Payer: Self-pay

## 2017-11-09 ENCOUNTER — Telehealth: Payer: Self-pay | Admitting: Family Medicine

## 2017-11-09 DIAGNOSIS — F411 Generalized anxiety disorder: Secondary | ICD-10-CM

## 2017-11-09 MED ORDER — SERTRALINE HCL 50 MG PO TABS: 50.0000 mg | ORAL_TABLET | Freq: Every day | ORAL | 0 refills | Status: DC

## 2017-11-09 NOTE — Telephone Encounter (Signed)
Called patient left message to notify. MPulliam, CMA/RT(R)

## 2017-11-09 NOTE — ED Provider Notes (Signed)
[facility]PART[facility] Provider Note   CSN: [id number] Arrival date & time: 11/07/17  1508     History   Chief Complaint Chief Complaint  Patient presents with  . Numbness    HPI Connie Cole is a 48 y.o. female.  HPI Patient is a 47 year old female with past medical history of generalized anxiety disorder, palpitations, osteoarthritis who comes in today endorsing right ear numbness.  Patient states it began at 9 AM when she woke up.  Patient was concerned as her mother has had a stroke in the past.  Patient was on her way to her chiropractor's office when she decided come to the ED instead.  Patient denies any weakness other numbness chest pain shortness of breath recent illness fevers chills nausea vomiting diarrhea or dysuria.  Patient states that she then developed a fast heart rate felt like she could not breathe and then experienced tingling in her fingers and toes.  Patient states that the symptoms have resolved and her only current symptom is her right ear numbness.    Past Medical History:  Diagnosis Date  . Arthritis    hands, hips  . GERD (gastroesophageal reflux disease)    occasional diet controlled- no med  . Palpitations    wore heart monitor x 1 wk, unknown - stress related    Patient Active Problem List   Diagnosis Date Noted  . H/O: pituitary tumor 09/06/2017  . Sleeping difficulty 09/06/2017  . Subclinical hypothyroidism 09/06/2017  . Generalized osteoarthritis of multiple sites 09/06/2017  . Acute reaction to situational stress 05/16/2017  . GAD (generalized anxiety disorder) 05/16/2017  . Stress-related physiological response affecting physical condition 05/16/2017  . Numbness and tingling in both arms/ hands 05/16/2017  . Non-cardiac chest pain- assoc with emotional stress 05/16/2017  . Palpitations 01/18/2016  . Family history of stroke (cerebrovascular)- mother 66's 01/18/2016  . Family history of atrial fibrillation  01/18/2016  . Tinea corporis (h/o tinea versicolor per pt) 12/20/2015  . Seborrheic dermatitis of scalp 12/16/2015  . GERD (gastroesophageal reflux disease) 12/16/2015    Past Surgical History:  Procedure Laterality Date  . COLONOSCOPY     polyp  . DILITATION & CURRETTAGE/HYSTROSCOPY WITH HYDROTHERMAL ABLATION N/A 06/03/2016   Procedure: DILATATION & CURETTAGE/HYSTEROSCOPY WITH HYDROTHERMAL ABLATION;  Surgeon: Brien Few, MD;  Location: [facility];  Service: Gynecology;  Laterality: N/A;  . IR RADIOLOGIST EVAL & MGMT  09/06/2016  . TONSILLECTOMY       OB History   None      Home Medications    Prior to Admission medications   Medication Sig Start Date End Date Taking? Authorizing Provider  amitriptyline (ELAVIL) 25 MG tablet 1 tab at bedtime as needed for sleep Patient taking differently: Take 25 mg by mouth at bedtime.  09/06/17  Yes Opalski, Neoma Laming, DO  Krill Oil 500 MG CAPS Take 500 mg by mouth daily.   Yes [provider]  sertraline (ZOLOFT) 50 MG tablet Take 1 tablet (50 mg total) by mouth daily. 11/09/17   [name], DO    Family History Family History  Problem Relation Age of Onset  . Stroke Mother   . Heart disease Mother   . Hypertension Mother   . Cancer Father        eye  . Healthy Sister     Social History Social History   Tobacco Use  . Smoking status: Former Smoker    Packs/day: 0.50    Years: 5.00  Pack years: 2.50    Types: Cigarettes    Last attempt to quit: 06/27/2000    Years since quitting: 17.3  . Smokeless tobacco: Never Used  Substance Use Topics  . Alcohol use: Yes    Alcohol/week: 1.2 oz    Types: 2 Shots of liquor per week  . Drug use: No     Allergies   Latex   Review of Systems Review of Systems  Constitutional: Negative for chills, fatigue and fever.  Respiratory: Positive for shortness of breath.   Cardiovascular: Negative for chest pain.  Neurological: Positive for numbness. Negative for weakness.    All other systems reviewed and are negative.    Physical Exam Updated Vital Signs BP 124/84 (BP Location: Right Arm)   Pulse 78   Temp 98.3 F (36.8 C) (Oral)   Resp 16   LMP 11/07/2017   SpO2 100%   Physical Exam  Constitutional: She appears well-developed and well-nourished. No distress.  HENT:  Head: Normocephalic and atraumatic.  Eyes: Pupils are equal, round, and reactive to light. Conjunctivae and EOM are normal.  Neck: Neck supple.  Cardiovascular: Normal rate and regular rhythm.  No murmur heard. Pulmonary/Chest: Effort normal and breath sounds normal. No respiratory distress.  Abdominal: Soft. There is no tenderness.  Musculoskeletal: She exhibits no edema.  Neurological: She is alert. No cranial nerve deficit or sensory deficit. She exhibits normal muscle tone. Coordination normal.  Skin: Skin is warm and dry.  Psychiatric: She has a normal mood and affect.  Nursing note and vitals reviewed.    ED Treatments / Results  Labs (all labs ordered are listed, but only abnormal results are displayed) Labs Reviewed  CBC - Abnormal; Notable for the following components:      Result Value   RBC 3.86 (*)    MCV 100.3 (*)    All other components within normal limits  ETHANOL  PROTIME-INR  APTT  DIFFERENTIAL  COMPREHENSIVE METABOLIC PANEL  RAPID URINE DRUG SCREEN, HOSP PERFORMED  I-STAT BETA HCG BLOOD, ED ([facility], [facility], [facility] ONLY)  I-STAT TROPONIN, ED    EKG None  Radiology No results found.  Procedures Procedures (including critical care time)  Medications Ordered in ED Medications - No data to display   Initial Impression / Assessment and Plan / ED Course  I have reviewed the triage vital signs and the nursing notes.  Pertinent labs & imaging results that were available during my care of the patient were reviewed by me and considered in my medical decision making (see chart for details).     Patient's laboratory work-up largely within normal limits  without concerning findings.  Patient's head CT shows no acute abnormality.  Physical exam reveals no lymphadenopathy.  Patient with normal strength and sensory, normal reflexes, normal coordination.  Believe patient symptoms to be secondary to anxiety, with the exception of right ear numbness.  Patient instructed to follow-up with her PCP for further evaluation. Pt given appropriate f/u and return precautions. Pt voiced understanding and is agreeable to discharge at this time.    Final Clinical Impressions(s) / ED Diagnoses   Final diagnoses:  Paresthesia  Numbness    ED Discharge Orders    None       Chapman Moss, MD 11/10/17 2217    Hayden Rasmussen, MD 11/13/17 1155

## 2017-11-09 NOTE — Telephone Encounter (Signed)
Patient called requesting refill on Sertaline and Amitriptyline.  Reviewed the chart sent in refill for Sertaline but the amitriptyline shows that the patient should have a refill on the medication. Verified with [facility] that the patient has a refill. Called patient to notify, left message for her to call back. MPulliam, CMA/RT(R)

## 2017-11-09 NOTE — Telephone Encounter (Signed)
Called patient left message that refill were sent to the pharmacy. MPulliam, CMA/RT(R)

## 2017-11-09 NOTE — Telephone Encounter (Signed)
Patient called states Connie Cole called her--- forwarding message to medical assistant--glh

## 2017-11-10 ENCOUNTER — Other Ambulatory Visit: Payer: Self-pay

## 2017-11-10 NOTE — Telephone Encounter (Signed)
Patient requesting a refill on Amitriptyline states that she is out early due to taking 2 nightly.  Pharmacy needs a new RX with corrected directions.  Patient last seen on 09/06/17 and medication was written at that time.   Please review and advise. MPulliam, CMA/RT(R)

## 2017-11-13 DIAGNOSIS — M25551 Pain in right hip: Secondary | ICD-10-CM | POA: Diagnosis not present

## 2017-11-13 DIAGNOSIS — R350 Frequency of micturition: Secondary | ICD-10-CM | POA: Diagnosis not present

## 2017-11-13 DIAGNOSIS — N920 Excessive and frequent menstruation with regular cycle: Secondary | ICD-10-CM | POA: Diagnosis not present

## 2017-11-13 MED ORDER — AMITRIPTYLINE HCL 25 MG PO TABS: ORAL_TABLET | ORAL | 0 refills | Status: DC

## 2017-11-13 NOTE — Telephone Encounter (Signed)
Patient is tolerating medication well at this time. MPulliam, CMA/RT(R)

## 2017-11-13 NOTE — Telephone Encounter (Signed)
I assume patient is tolerating it well without side effect and that she has no hangover effect or anything?  Please document that so we can show we are monitoring our treatment plan of insomnia on patient.  -Okay to refill Elavil at 2 tabs nightly as needed sleep.  Dispense 180 tablets, no refills.  Patient will be seen in the office and evaluated prior to refills being dispensed on this medication.

## 2017-11-15 ENCOUNTER — Encounter: Payer: Self-pay | Admitting: Family Medicine

## 2017-11-15 ENCOUNTER — Ambulatory Visit (INDEPENDENT_AMBULATORY_CARE_PROVIDER_SITE_OTHER): Payer: BLUE CROSS/BLUE SHIELD | Admitting: Family Medicine

## 2017-11-15 VITALS — BP 111/80 | HR 94 | Ht 63.0 in | Wt 139.2 lb

## 2017-11-15 DIAGNOSIS — F411 Generalized anxiety disorder: Secondary | ICD-10-CM | POA: Diagnosis not present

## 2017-11-15 DIAGNOSIS — J324 Chronic pansinusitis: Secondary | ICD-10-CM

## 2017-11-15 DIAGNOSIS — G479 Sleep disorder, unspecified: Secondary | ICD-10-CM

## 2017-11-15 DIAGNOSIS — M5090 Cervical disc disorder, unspecified, unspecified cervical region: Secondary | ICD-10-CM

## 2017-11-15 DIAGNOSIS — J3089 Other allergic rhinitis: Secondary | ICD-10-CM | POA: Diagnosis not present

## 2017-11-15 DIAGNOSIS — F438 Other reactions to severe stress: Secondary | ICD-10-CM

## 2017-11-15 DIAGNOSIS — M62838 Other muscle spasm: Secondary | ICD-10-CM

## 2017-11-15 DIAGNOSIS — R200 Anesthesia of skin: Secondary | ICD-10-CM

## 2017-11-15 DIAGNOSIS — F4389 Other reactions to severe stress: Secondary | ICD-10-CM

## 2017-11-15 MED ORDER — FLUTICASONE PROPIONATE 50 MCG/ACT NA SUSP: NASAL | 11 refills | Status: DC

## 2017-11-15 NOTE — Patient Instructions (Signed)
You can use over-the-counter afrin nasal spray for up to 3 days (NO longer than that) which will help acutely with nasal drainage/ congestion short term.     Also, sterile saline nasal rinses, such as Milta Deiters med or AYR sinus rinses, can be very helpful and should be done twice daily- especially throughout the allergy season.   Remember you should use distilled water or previously boiled water to do this.  Then you may use over-the-counter Flonase 1 spray each nostril twice daily after sinus rinses.  You can do this in addition to taking any Allegra or Claritin or Zyrtec etc. that you may be taking daily.  If your eyes tend to get an itchy or irritated feeling when your seasonal allergies get bad, you can use Naphcon-A over-the-counter eyedrops as needed   ---> Great job on the self-care Connie Cole-keep up the good work !!

## 2017-11-15 NOTE — Progress Notes (Signed)
Impression and Recommendations:    1. Right facial numbness   2. Cervical disc disorder   3. Muscle spasm- neck muscles   4. GAD (generalized anxiety disorder)   5. Stress-related physiological response affecting physical condition   6. Sleeping difficulty   7. Chronic pansinusitis   8. Environmental and seasonal allergies      1. Chronic Neck Pain & Allergy Symptoms - Advised patient that if the muscles in her neck are tight, this can pinch her nerves.  Advised patient to take care of her neck at home as recommended by her chiropractor.  - Advised the patient to begin using AYR or Neilmed sinus rinses BID followed by flonase BID (one spray to each nostril).  Advised that the patient may also incorporate allegra D or claritin D during an allergy flare, and after that, start using normal allegra or claritin PRN.   2. GAD & Sleep - Continue Zoloft as prescribed.  Patient tolerates well.  - Continue Elavil as prescribed.  Patient tolerates well, but notes "hit or miss" improvement on her sleep.  However, patient declined changing medications at this time.  3. General Health Maintenance - Advised patient to continue working toward exercising to improve health.    - Recommended that the patient eventually strive for at least 150 minutes of moderate cardiovascular activity per week according to guidelines established by the [facility].   - Healthy dietary habits encouraged, including low-carb, and high amounts of lean protein in diet.   - Patient should also consume adequate amounts of water - half of body weight in oz of water per day.  4. Follow-Up - Patient knows to return for regular follow-ups and CPE as scheduled.  - Return in 4 months for chronic OV.   No orders of the defined types were placed in this encounter.   Meds ordered this encounter  Medications  . fluticasone (FLONASE) 50 MCG/ACT nasal spray    Sig: 1 spray each nostril twice daily after sinus rinses   Dispense:  16 g    Refill:  11    Gross side effects, risk and benefits, and alternatives of medications and treatment plan in general discussed with patient.  Patient is aware that all medications have potential side effects and we are unable to predict every side effect or drug-drug interaction that may occur.   Patient will call with any questions prior to using medication if they have concerns.  Expresses verbal understanding and consents to current therapy and treatment regimen.  No barriers to understanding were identified.  Red flag symptoms and signs discussed in detail.  Patient expressed understanding regarding what to do in case of emergency\urgent symptoms  Please see AVS handed out to patient at the end of our visit for further patient instructions/ counseling done pertaining to today's office visit.   Return in about 4 months (around 03/18/2018).    Note: This note was prepared with assistance of Dragon voice recognition software. Occasional wrong-word or sound-a-like substitutions may have occurred due to the inherent limitations of voice recognition software.  This document serves as a record of services personally performed by [name], DO. It was created on her behalf by Toni Amend, a trained medical scribe. The creation of this record is based on the scribe's personal observations and the provider's statements to them.   I have reviewed the above medical documentation for accuracy and completeness and I concur.  [name] 11/15/17 12:29 PM   ------------------------------------------------------------------------------------------------------------------------------------------  Subjective:     HPI: Connie Cole is a 47 y.o. female who presents to [place] at [facility] today for issues as discussed below.  HPI and Assessment from Recent ED Visit (11/07/2017) Patient is a 47 year old female with past medical history of  generalized anxiety disorder, palpitations, osteoarthritis who comes in today endorsing right ear numbness.  Patient states it began at 9 AM when she woke up.  Patient was concerned as her mother has had a stroke in the past.  Patient was on her way to her chiropractor's office when she decided come to the ED instead.  Patient denies any weakness other numbness chest pain shortness of breath recent illness fevers chills nausea vomiting diarrhea or dysuria.  Patient states that she then developed a fast heart rate felt like she could not breathe and then experienced tingling in her fingers and toes.  Patient states that the symptoms have resolved and her only current symptom is her right ear numbness.   Initial Impression / Assessment and Plan / ED Course  I have reviewed the triage vital signs and the nursing notes.  Pertinent labs & imaging results that were available during my care of the patient were reviewed by me and considered in my medical decision making (see chart for details).  Patient's laboratory work-up largely within normal limits without concerning findings.  Patient's head CT shows no acute abnormality.  Physical exam reveals no lymphadenopathy.  Patient with normal strength and sensory, normal reflexes, normal coordination.  Believe patient symptoms to be secondary to anxiety, with the exception of right ear numbness.  Patient instructed to follow-up with her PCP for further evaluation. Pt given appropriate f/u and return precautions. Pt voiced understanding and is agreeable to discharge at this time.    Patient's Status Since ED Visit Patient notes that she hasn't had anxiety for a while since she's been on Zoloft and "giving stress to God."  Notes that she does stress management exercises these days, and tries to constantly work on self-growth to mitigate her anxiety.  Notes that talking to her mom gave her an anxiety attack, which caused her to have even more numbness and  tingling in her extremities.  Visited the ED after this.  Went to the chiropractor the day after her ED visit and had the right side of her neck treated.  Notes that this helped a little, but the right side of her face is still a "little numb."  Feels that her symptoms are a combination of her neck going out and her "glands" being swollen due to seawsonal allergies and sinusitis.  Overall patient is much improved and does not have concerns about this today.  Felt that both her ears were kind of "full" this morning, and has been taking Mucinex and ear drops to mitigate this.  Patient remarks that she has been trying to exercise and take care of herself/get stronger lately.  She further notes that she has a benign tumor in her pituitary gland.  Patient's Zoloft is working, but the Elavil is hit or miss for sleep.  Sometimes she takes an herbal snooze supplement with it to help her a little bit, but notes "it just depends."  Patient does not wish to change her medication at this time.   Wt Readings from Last 3 Encounters:  11/15/17 139 lb 3.2 oz (63.1 kg)  09/06/17 136 lb 6.4 oz (61.9 kg)  08/02/17 135 lb (61.2 kg)   BP Readings from Last  3 Encounters:  11/15/17 111/80  11/07/17 124/84  09/06/17 106/69   Pulse Readings from Last 3 Encounters:  11/15/17 94  11/07/17 78  09/06/17 80   BMI Readings from Last 3 Encounters:  11/15/17 24.66 kg/m  09/06/17 24.16 kg/m  08/02/17 23.91 kg/m     Patient Care Team    Relationship Specialty Notifications Start End  [name], DO PCP - General Family Medicine  12/10/15   Brien Few, MD Consulting Physician Obstetrics and Gynecology  05/16/17   Merrilee Seashore, MD Consulting Physician Internal Medicine  05/16/17    Comment: Actually sees Dr. Felicie Morn at [facility] medical      Patient Active Problem List   Diagnosis Date Noted  . Acute reaction to situational stress 05/16/2017    Priority: High  . GAD (generalized  anxiety disorder) 05/16/2017    Priority: High  . Stress-related physiological response affecting physical condition 05/16/2017    Priority: Medium  . GERD (gastroesophageal reflux disease) 12/16/2015    Priority: Medium  . Tinea corporis (h/o tinea versicolor per pt) 12/20/2015    Priority: Low  . Environmental and seasonal allergies 11/15/2017  . Chronic pansinusitis 11/15/2017  . Cervical disc disorder 11/15/2017  . Muscle spasm- neck muscles 11/15/2017  . H/O: pituitary tumor 09/06/2017  . Sleeping difficulty 09/06/2017  . Subclinical hypothyroidism 09/06/2017  . Generalized osteoarthritis of multiple sites 09/06/2017  . Numbness and tingling in both arms/ hands 05/16/2017  . Non-cardiac chest pain- assoc with emotional stress 05/16/2017  . Palpitations 01/18/2016  . Family history of stroke (cerebrovascular)- mother 32's 01/18/2016  . Family history of atrial fibrillation 01/18/2016  . Seborrheic dermatitis of scalp 12/16/2015    Past Medical history, Surgical history, Family history, Social history, Allergies and Medications have been entered into the medical record, reviewed and changed as needed.    Current Meds  Medication Sig  . amitriptyline (ELAVIL) 25 MG tablet 1 to 2 tab at bedtime as needed for sleep  . Krill Oil 500 MG CAPS Take 500 mg by mouth daily.  . sertraline (ZOLOFT) 50 MG tablet Take 1 tablet (50 mg total) by mouth daily.    Allergies:  Allergies  Allergen Reactions  . Latex Rash    Review of Systems:  A fourteen system review of systems was performed and found to be positive as per HPI.   Objective:   Blood pressure 111/80, pulse 94, height 5\' 3"  (1.6 m), weight 139 lb 3.2 oz (63.1 kg), last menstrual period 11/07/2017, SpO2 98 %. Body mass index is 24.66 kg/m. General:  Well Developed, well nourished, appropriate for stated age.  Neuro:  Alert and oriented,  extra-ocular muscles intact, CN II-XII GI HEENT:  Normocephalic, atraumatic, neck  supple, no carotid bruits appreciated, no lymphadenopathy. Skin:  no gross rash, warm, pink. Cardiac:  RRR, S1 S2 Respiratory:  ECTA B/L and A/P, Not using accessory muscles, speaking in full sentences- unlabored. Vascular:  Ext warm, no cyanosis apprec.; cap RF less 2 sec. Psych:  No HI/SI, judgement and insight good, Euthymic mood. Full Affect. Musculoskeletal: No bony tenderness over C-spine.  Patient without any muscle spasms appreciated in neck or cervical region.  Has trigger points in the posterior neck on the right in C1-3 region however. NVI distal b/l Uext.

## 2017-12-12 DIAGNOSIS — M76891 Other specified enthesopathies of right lower limb, excluding foot: Secondary | ICD-10-CM | POA: Diagnosis not present

## 2017-12-12 DIAGNOSIS — M1611 Unilateral primary osteoarthritis, right hip: Secondary | ICD-10-CM | POA: Diagnosis not present

## 2017-12-12 DIAGNOSIS — M25851 Other specified joint disorders, right hip: Secondary | ICD-10-CM | POA: Diagnosis not present

## 2017-12-12 DIAGNOSIS — M247 Protrusio acetabuli: Secondary | ICD-10-CM | POA: Diagnosis not present

## 2017-12-25 DIAGNOSIS — M25551 Pain in right hip: Secondary | ICD-10-CM | POA: Diagnosis not present

## 2017-12-27 DIAGNOSIS — M25551 Pain in right hip: Secondary | ICD-10-CM | POA: Diagnosis not present

## 2018-01-09 DIAGNOSIS — Z1231 Encounter for screening mammogram for malignant neoplasm of breast: Secondary | ICD-10-CM | POA: Diagnosis not present

## 2018-01-09 LAB — HM MAMMOGRAPHY

## 2018-02-16 DIAGNOSIS — M1611 Unilateral primary osteoarthritis, right hip: Secondary | ICD-10-CM | POA: Diagnosis not present

## 2018-02-16 DIAGNOSIS — M25551 Pain in right hip: Secondary | ICD-10-CM | POA: Diagnosis not present

## 2018-03-02 ENCOUNTER — Other Ambulatory Visit: Payer: Self-pay

## 2018-03-05 ENCOUNTER — Other Ambulatory Visit (INDEPENDENT_AMBULATORY_CARE_PROVIDER_SITE_OTHER): Payer: BLUE CROSS/BLUE SHIELD

## 2018-03-05 DIAGNOSIS — Z87898 Personal history of other specified conditions: Secondary | ICD-10-CM | POA: Diagnosis not present

## 2018-03-05 DIAGNOSIS — E039 Hypothyroidism, unspecified: Secondary | ICD-10-CM

## 2018-03-05 DIAGNOSIS — R7989 Other specified abnormal findings of blood chemistry: Secondary | ICD-10-CM | POA: Diagnosis not present

## 2018-03-05 DIAGNOSIS — E038 Other specified hypothyroidism: Secondary | ICD-10-CM

## 2018-03-05 NOTE — Addendum Note (Signed)
Addended by: Lanier Prude D on: 03/05/2018 10:59 AM   Modules accepted: Level of Service

## 2018-03-06 ENCOUNTER — Encounter: Payer: Self-pay | Admitting: Family Medicine

## 2018-03-06 ENCOUNTER — Ambulatory Visit (INDEPENDENT_AMBULATORY_CARE_PROVIDER_SITE_OTHER): Payer: BLUE CROSS/BLUE SHIELD | Admitting: Family Medicine

## 2018-03-06 VITALS — BP 124/80 | HR 90 | Ht 63.5 in | Wt 138.0 lb

## 2018-03-06 DIAGNOSIS — F438 Other reactions to severe stress: Secondary | ICD-10-CM

## 2018-03-06 DIAGNOSIS — F411 Generalized anxiety disorder: Secondary | ICD-10-CM

## 2018-03-06 DIAGNOSIS — E039 Hypothyroidism, unspecified: Secondary | ICD-10-CM | POA: Diagnosis not present

## 2018-03-06 DIAGNOSIS — G479 Sleep disorder, unspecified: Secondary | ICD-10-CM | POA: Diagnosis not present

## 2018-03-06 DIAGNOSIS — E221 Hyperprolactinemia: Secondary | ICD-10-CM | POA: Insufficient documentation

## 2018-03-06 DIAGNOSIS — Z87898 Personal history of other specified conditions: Secondary | ICD-10-CM

## 2018-03-06 DIAGNOSIS — E038 Other specified hypothyroidism: Secondary | ICD-10-CM

## 2018-03-06 DIAGNOSIS — F4389 Other reactions to severe stress: Secondary | ICD-10-CM

## 2018-03-06 LAB — T4, FREE: Free T4: 0.86 ng/dL (ref 0.82–1.77)

## 2018-03-06 LAB — T3, FREE: T3, Free: 2.5 pg/mL (ref 2.0–4.4)

## 2018-03-06 LAB — TSH: TSH: 2.74 u[IU]/mL (ref 0.450–4.500)

## 2018-03-06 MED ORDER — SERTRALINE HCL 50 MG PO TABS: 75.0000 mg | ORAL_TABLET | Freq: Every day | ORAL | 1 refills | Status: DC

## 2018-03-06 MED ORDER — TRAZODONE HCL 50 MG PO TABS: 50.0000 mg | ORAL_TABLET | Freq: Every evening | ORAL | 0 refills | Status: DC | PRN

## 2018-03-06 NOTE — Progress Notes (Signed)
Impression and Recommendations:    1. GAD (generalized anxiety disorder)   2. Stress-related physiological response affecting physical condition   3. Subclinical hypothyroidism   4. Sleeping difficulty   5. H/O: pituitary tumor   6. Hyperprolactinemia ([place])     1. Hyperprolactinemia - Strongly encouraged patient to return to endocrinology for follow-up as needed.   2. Thyroid - Reviewed recent thyroid lab results with patient today.  WNL.  3. Insomnia/Sleep Concerns - Patient with ongoing sleep concerns. - Discontinue amitriptyline. -  Encouraged patient to begin trazodone 50 mg today. - Encouraged patient to take the dose and be in bed within 30 minutes. - Start with 25 mg nightly, knowing that 50-100 mg is a normal dose.  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  - Advised use of blue light glasses to block interference of blue light at night.   - Reviewed prudent sleep hygiene habits with patient today.    - Encouraged establishing a desired routine.  - Discussed relaxation techniques in office today, such as meditation and mindfulness.  - Continue daily exercise.  4. Generalized Anxiety - Encouraged patient to increase dose of sertraline to 75 mg. See med list below. - Symptoms suboptimally controlled at this time, hence treatment plan change.  - Patient tolerating meds well without complication.  Denies S-E  5. BMI Counseling Explained to patient what BMI refers to, and what it means medically.    Told patient to think about it as a "medical risk stratification measurement" and how increasing BMI is associated with increasing risk/ or worsening state of various diseases such as hypertension, hyperlipidemia, diabetes, premature OA, depression  etc.  American Heart Association guidelines for healthy diet, basically Mediterranean diet, and exercise guidelines of 30 minutes 5 days per week or more discussed in detail.  Health counseling performed.  All questions answered.  6. Lifestyle & Preventative Health Maintenance - Advised patient to continue working toward exercising to improve overall mental, physical, and emotional health.    - Encouraged patient to engage in daily physical activity, especially a formal exercise routine.  Recommended that the patient eventually strive for at least 150 minutes of moderate cardiovascular activity per week according to guidelines established by the [employer].   - Healthy dietary habits encouraged, including low-carb, and high amounts of lean protein in diet.   - Patient should also consume adequate amounts of water - half of body weight in oz of water per day.   Education and routine counseling performed. Handouts provided.  7. Follow-Up - Prescriptions refilled today and changed as needed. - Return for re-check fasting lab work as recommended. - Otherwise, continue to return for CPE and chronic health maintenance as scheduled.   - Patient knows to call in for acute concerns PRN, and other health concerns as needed.   Orders Placed This Encounter  Procedures  . Prolactin    Medications Discontinued During This Encounter  Medication Reason  . sertraline (ZOLOFT) 50 MG tablet   . amitriptyline (ELAVIL) 25 MG tablet   . sertraline (ZOLOFT) 50 MG tablet Reorder  . traZODone (DESYREL) 50 MG tablet Reorder     Meds ordered this encounter  Medications  . DISCONTD: traZODone (DESYREL) 50 MG  tablet    Sig: Take 1 tablet (50 mg total) by mouth at bedtime as needed for sleep.    Dispense:  90 tablet    Refill:  0  . DISCONTD: sertraline (ZOLOFT) 50 MG tablet    Sig: Take 1.5 tablets (75 mg total) by mouth daily.    Dispense:  135 tablet    Refill:  1  . sertraline (ZOLOFT) 50 MG tablet     Sig: Take 1.5 tablets (75 mg total) by mouth daily.    Dispense:  135 tablet    Refill:  1  . traZODone (DESYREL) 50 MG tablet    Sig: Take 1 tablet (50 mg total) by mouth at bedtime as needed for sleep.    Dispense:  90 tablet    Refill:  0    Gross side effects, risk and benefits, and alternatives of medications and treatment plan in general discussed with patient.  Patient is aware that all medications have potential side effects and we are unable to predict every side effect or drug-drug interaction that may occur.   Patient will call with any questions prior to using medication if they have concerns.  Expresses verbal understanding and consents to current therapy and treatment regimen.  No barriers to understanding were identified.  Red flag symptoms and signs discussed in detail.  Patient expressed understanding regarding what to do in case of emergency\urgent symptoms  Please see AVS handed out to patient at the end of our visit for further patient instructions/ counseling done pertaining to today's office visit.   Return for f/up in 2-14mo after inc zoloft and add trazadone prn sleep.     Note:  This document was prepared using Dragon voice recognition software and may include unintentional dictation errors.  This document serves as a record of services personally performed by [name], DO. It was created on her behalf by Toni Amend, a trained medical scribe. The creation of this record is based on the scribe's personal observations and the provider's statements to them.   I have reviewed the above medical documentation for accuracy and completeness and I concur.  [name] 03/06/18 5:07 PM   ----------------------------------------------------------------------------------------------------------------------    Subjective:    CC:  Chief Complaint  Patient presents with  . Follow-up    HPI: Connie Cole is a 47 y.o. female who presents to [place] at [facility] today for follow-up of mood.   Thyroid normal on lab check drawn yesterday.  Hyperprolactinemia  Patient states that the last time she visited Dr. Ashby Dawes, she was advised that her prolactin was up because her thyroid was down at that time.  She was told that when her thyroid regulates, if her prolactin doesn't regulate, she needs to have her tumor assessed again.  When patient's TSH was up, this causes her prolactin to be up, which would be falsely elevated due to hypothyroidism.  Thyroid is good  Sleep Concerns When patient first started taking amitriptyline, it was working.  No longer.  Notes she now has one to two vodka drinks to help her sleep.  States it takes her 3-4 hours to go to sleep now.  She tried to increase her elavil dose.  Notes she would feel hung over the next day on an increased dose.  Patient has not tried Ambien in the past.  Patient feels that her baseline is "hyper."  Notes that she's having issues functioning due to her lack of sleep, and often feels  tired.  Generalized Anxiety Patient feels that maybe her Zoloft needs to be increased a bit.  She feels that she's changed her thought process about several aspects of her life to feel less anxious.    Depression screen [facility] 2/9 03/06/2018 11/15/2017 11/15/2017  Decreased Interest 0 0 0  Down, Depressed, Hopeless 0 0 0  PHQ - 2 Score 0 0 0  Altered sleeping 3 1 -  Tired, decreased energy 1 0 -  Change in appetite 0 0 -  Feeling bad or failure about yourself  0 0 -  Trouble concentrating 1 0 -  Moving slowly or fidgety/restless 0 0 -  Suicidal thoughts 0 0 -  PHQ-9 Score 5 1 -  Difficult doing work/chores Somewhat difficult - -     GAD 7 : Generalized Anxiety Score 09/06/2017  Nervous, Anxious, on Edge 0  Control/stop worrying 0  Worry too much - different things 1  Trouble relaxing 0  Restless 1  Easily annoyed or irritable 0  Afraid - awful might happen 0  Total  GAD 7 Score 2     Wt Readings from Last 3 Encounters:  03/06/18 138 lb (62.6 kg)  11/15/17 139 lb 3.2 oz (63.1 kg)  09/06/17 136 lb 6.4 oz (61.9 kg)   BP Readings from Last 3 Encounters:  03/06/18 124/80  11/15/17 111/80  11/07/17 124/84   Pulse Readings from Last 3 Encounters:  03/06/18 90  11/15/17 94  11/07/17 78   BMI Readings from Last 3 Encounters:  03/06/18 24.06 kg/m  11/15/17 24.66 kg/m  09/06/17 24.16 kg/m         Patient Care Team    Relationship Specialty Notifications Start End  [name], DO PCP - General Family Medicine  12/10/15   Brien Few, MD Consulting Physician Obstetrics and Gynecology  05/16/17   Merrilee Seashore, MD Consulting Physician Internal Medicine  05/16/17    Comment: Actually sees Dr. Felicie Morn at [facility] medical      Patient Active Problem List   Diagnosis Date Noted  . Acute reaction to situational stress 05/16/2017    Priority: High  . GAD (generalized anxiety disorder) 05/16/2017    Priority: High  . Stress-related physiological response affecting physical condition 05/16/2017    Priority: Medium  . GERD (gastroesophageal reflux disease) 12/16/2015    Priority: Medium  . Tinea corporis (h/o tinea versicolor per pt) 12/20/2015    Priority: Low  . Hyperprolactinemia ([place]) 03/06/2018  . Environmental and seasonal allergies 11/15/2017  . Chronic pansinusitis 11/15/2017  . Cervical disc disorder 11/15/2017  . Muscle spasm- neck muscles 11/15/2017  . H/O: pituitary tumor 09/06/2017  . Sleeping difficulty 09/06/2017  . Subclinical hypothyroidism 09/06/2017  . Generalized osteoarthritis of multiple sites 09/06/2017  . Numbness and tingling in both arms/ hands 05/16/2017  . Non-cardiac chest pain- assoc with emotional stress 05/16/2017  . Palpitations 01/18/2016  . Family history of stroke (cerebrovascular)- mother 27's 01/18/2016  . Family history of atrial fibrillation 01/18/2016  . Seborrheic  dermatitis of scalp 12/16/2015    Past Medical history, Surgical history, Family history, Social history, Allergies and Medications have been entered into the medical record, reviewed and changed as needed.    Current Meds  Medication Sig  . fluticasone (FLONASE) 50 MCG/ACT nasal spray 1 spray each nostril twice daily after sinus rinses  . Krill Oil 500 MG CAPS Take 500 mg by mouth daily.  Marland Kitchen loperamide (IMODIUM A-D) 2 MG tablet TAKE 2 TABS NOW AS NEED  FOR LOOSE STOOLS AND THEN 1 TABLET EVERY HR AS NEEDED (MAX 8 TABS DAILY)  . sertraline (ZOLOFT) 50 MG tablet Take 1.5 tablets (75 mg total) by mouth daily.  . [DISCONTINUED] amitriptyline (ELAVIL) 25 MG tablet 1 to 2 tab at bedtime as needed for sleep  . [DISCONTINUED] sertraline (ZOLOFT) 50 MG tablet Take 1.5 tablets (75 mg total) by mouth daily.  . [DISCONTINUED] sertraline (ZOLOFT) 50 MG tablet Take 1 tablet (50 mg total) by mouth daily.    Allergies:  Allergies  Allergen Reactions  . Latex Swelling  . Latex Rash     Review of Systems: Review of Systems: General:   No F/C, wt loss Pulm:   No DIB, SOB, pleuritic chest pain Card:  No CP, palpitations Abd:  No n/v/d or pain Ext:  No inc edema from baseline Psych: no SI/ HI    Objective:   Blood pressure 124/80, pulse 90, height 5' 3.5" (1.613 m), weight 138 lb (62.6 kg), SpO2 98 %. Body mass index is 24.06 kg/m. General:  Well Developed, well nourished, appropriate for stated age.  Neuro:  Alert and oriented,  extra-ocular muscles intact  HEENT:  Normocephalic, atraumatic, neck supple, no carotid bruits appreciated  Skin:  no gross rash, warm, pink. Cardiac:  RRR, S1 S2 Respiratory:  ECTA B/L and A/P, Not using accessory muscles, speaking in full sentences- unlabored. Vascular:  Ext warm, no cyanosis apprec.; cap RF less 2 sec. Psych:  No HI/SI, judgement and insight good, Euthymic mood. Full Affect.

## 2018-03-06 NOTE — Patient Instructions (Signed)
Mindfulness meditations    If you have insomnia or difficulty sleeping, this information is for you:  - Avoid caffeinated beverages after lunch,  no alcoholic beverages,  no eating within 2-3 hours of lying down,  avoid exposure to blue light before bed,  avoid daytime naps, and  needs to maintain a regular sleep schedule- go to sleep and wake up around the same time every night.   - Resolve concerns or worries before entering bedroom:  Discussed relaxation techniques with patient and to keep a journal to write down fears\ worries.  I suggested seeing a counselor for CBT.   - Recommend patient meditate or do deep breathing exercises to help relax.   Incorporate the use of white noise machines or listen to "sleep meditation music", or recordings of guided meditations for sleep from YouTube which are free, such as  "guided meditation for detachment from over thinking"  by Mayford Knife.

## 2018-03-08 ENCOUNTER — Telehealth: Payer: Self-pay | Admitting: Family Medicine

## 2018-03-08 NOTE — Telephone Encounter (Signed)
Pt states was able to view all lab results except the Prolactin levels & wishes a call back with them.  ----pt also states that Dr. Raliegh Scarlet told her about some blue light blockers glasses but she can not find them online & wants to know where she can buy / order them.  --forwarding request to medical assistant. --Dion Body

## 2018-03-09 LAB — PROLACTIN: Prolactin: 44.1 ng/mL — ABNORMAL HIGH (ref 4.8–23.3)

## 2018-03-09 LAB — SPECIMEN STATUS REPORT

## 2018-03-09 NOTE — Telephone Encounter (Signed)
Called patient and notified of results. MPulliam, CMA/RT(R)

## 2018-03-12 DIAGNOSIS — M25532 Pain in left wrist: Secondary | ICD-10-CM | POA: Diagnosis not present

## 2018-03-13 DIAGNOSIS — E221 Hyperprolactinemia: Secondary | ICD-10-CM | POA: Diagnosis not present

## 2018-03-13 DIAGNOSIS — F5101 Primary insomnia: Secondary | ICD-10-CM | POA: Diagnosis not present

## 2018-03-13 DIAGNOSIS — D443 Neoplasm of uncertain behavior of pituitary gland: Secondary | ICD-10-CM | POA: Diagnosis not present

## 2018-03-13 DIAGNOSIS — E039 Hypothyroidism, unspecified: Secondary | ICD-10-CM | POA: Diagnosis not present

## 2018-03-16 ENCOUNTER — Other Ambulatory Visit: Payer: Self-pay | Admitting: Endocrinology

## 2018-03-16 DIAGNOSIS — E221 Hyperprolactinemia: Secondary | ICD-10-CM

## 2018-03-16 DIAGNOSIS — M25532 Pain in left wrist: Secondary | ICD-10-CM | POA: Diagnosis not present

## 2018-03-16 DIAGNOSIS — D443 Neoplasm of uncertain behavior of pituitary gland: Secondary | ICD-10-CM

## 2018-03-27 ENCOUNTER — Telehealth: Payer: Self-pay | Admitting: Family Medicine

## 2018-03-27 NOTE — Telephone Encounter (Signed)
error 

## 2018-03-30 ENCOUNTER — Other Ambulatory Visit: Payer: BLUE CROSS/BLUE SHIELD

## 2018-04-04 ENCOUNTER — Ambulatory Visit
Admission: RE | Admit: 2018-04-04 | Discharge: 2018-04-04 | Disposition: A | Discharge status: Home / Self Care | Payer: BLUE CROSS/BLUE SHIELD | Source: Ambulatory Visit | Attending: Endocrinology | Admitting: Endocrinology

## 2018-04-04 DIAGNOSIS — E221 Hyperprolactinemia: Secondary | ICD-10-CM | POA: Diagnosis not present

## 2018-04-04 DIAGNOSIS — D443 Neoplasm of uncertain behavior of pituitary gland: Secondary | ICD-10-CM

## 2018-04-04 MED ORDER — GADOBENATE DIMEGLUMINE 529 MG/ML IV SOLN
14.0000 mL | Freq: Once | INTRAVENOUS | Status: AC | PRN
  Administered 2018-04-04: 6 mL via INTRAVENOUS

## 2018-04-24 ENCOUNTER — Ambulatory Visit (INDEPENDENT_AMBULATORY_CARE_PROVIDER_SITE_OTHER): Payer: BLUE CROSS/BLUE SHIELD | Admitting: Neurology

## 2018-04-24 ENCOUNTER — Encounter: Payer: Self-pay | Admitting: Neurology

## 2018-04-24 VITALS — BP 124/78 | HR 93 | Ht 63.0 in | Wt 141.0 lb

## 2018-04-24 DIAGNOSIS — R9082 White matter disease, unspecified: Secondary | ICD-10-CM | POA: Diagnosis not present

## 2018-04-24 NOTE — Progress Notes (Signed)
Subjective:    Patient ID: Connie Cole is a 47 y.o. female.  HPI     Interim history:   Ms. Cappelli is a 47 yo woman with an underlying medical history of anxiety, reflux disease, and history of pituitary microadenoma, who presents for a new problem visit for white matter changes on her recent brain MRI. The patient is unaccompanied today. I have seen her one time before for sleep difficulties, she had a baseline sleep study on 09/05/2017 which showed benign findings, no OSA, no snoring, no PLMD. She was encouraged to follow-up with primary care and consider cognitive behavioral therapy for chronic insomnia.  Today, 04/24/2018: She reports feeling stressed recently, sadly she recently lost her father at age 60 to cancer. She is holding up okay. She was told by her endocrinologist that she had white matter changes on her MRI and that her microadenoma had become smaller. She had recent blood work through her primary care physician. She had a cholesterol check in March 2019 which showed normal findings, A1c was less than 5. She eats well, she exercises regularly and drinks plenty of water. She is a Engineer, maintenance. She had a brain MRI with and without contrast on 04/04/2018 as ordered by her endocrinologist and I reviewed the results: IMPRESSION: 1. 2 mm focus of hypoenhancement in the right lateral aspect of the pituitary gland, decreased from prior study, possibly microadenoma. 2. Multifocal white matter disease most commonly due to chronic small vessel ischemia, progressed from the prior study.   She has had no new neurological symptoms. She is worried about her higher blood pressure values today in clinic. She typically has a low normal blood pressure. She has increased her sertraline to 2 pills each day on her own. She has also started BuSpar recently for which she had a prescription for the past several months but has not taken it before. Unfortunately, she continues to utilize alcohol to help her  sleep. She has increased her alcohol intake recently because of stress and anxiety. She used to smoke in her 41s. She smoked a pack a day for at least 10 years in the past.   The patient's allergies, current medications, family history, past medical history, past social history, past surgical history and problem list were reviewed and updated as appropriate.   Previously:   08/02/2017: (She) reports a nearly 20 year history of sleep difficulty, her mother has similar difficulty.  She reports difficulty initiating and maintaining sleep. She has tried over-the-counter medications such as Benadryl and has been on prescription medications including Ativan, hydroxyzine, trazodone, amitriptyline, and more recently Seroquel. None of these help.  She admits, the utilized alcohol at night for insomnia. She admits that she has had 2 vodka and OTC sleep aids. She started using alcohol at night in order to sleep some 15 years ago. Also, some 15 years ago or so she tried her mother's Ambien and had drowsiness from it also. Side effects as her mother could not tolerate it very well. Her Epworth Sleepiness Scale score is 1 out of 24, fatigue score is 53 out of 63. She denies I reviewed your office note from 05/01/2017. She saw Dr. Raliegh Scarlet on 05/16/2017. She was started on sertraline and Buspar at the time. She was advised to seek counseling for her stress and anxiety. She has not started the BuSpar as yet. She has been on sertraline for the past 5 weeks or so and feels that it has helped for her anxiety and depression.  She was also told to consider cognitive behavioral therapy for insomnia but has not pursued it. She denies any restless leg symptoms. Her mother may have restless leg syndrome. She has worried about her parents' health. She had some stress with her 37 year old stepdaughter recently. Currently, she lives with her husband, they have 2 cats, she tries to have good sleep hygiene, does not watch TV in her  bed, tries to read. She has tried relaxation and meditation but is too high strung for this she admits. She was quite hyperactive as a child. She still feels like she has too much energy and cannot sit still. She admits that she has rumination of thought at night.  Her Past Medical History Is Significant For: Past Medical History:  Diagnosis Date  . Arthritis    hands, hips  . GERD (gastroesophageal reflux disease)    occasional diet controlled- no med  . Palpitations    wore heart monitor x 1 wk, unknown - stress related    Her Past Surgical History Is Significant For: Past Surgical History:  Procedure Laterality Date  . COLONOSCOPY     polyp  . DILITATION & CURRETTAGE/HYSTROSCOPY WITH HYDROTHERMAL ABLATION N/A 06/03/2016   Procedure: DILATATION & CURETTAGE/HYSTEROSCOPY WITH HYDROTHERMAL ABLATION;  Surgeon: Brien Few, MD;  Location: [facility];  Service: Gynecology;  Laterality: N/A;  . IR RADIOLOGIST EVAL & MGMT  09/06/2016  . TONSILLECTOMY      Her Family History Is Significant For: Family History  Problem Relation Age of Onset  . Stroke Mother   . Heart disease Mother   . Hypertension Mother   . Cancer Father        eye  . Healthy Sister     Her Social History Is Significant For: Social History   Socioeconomic History  . Marital status: Married    Spouse name: Not on file  . Number of children: Not on file  . Years of education: Not on file  . Highest education level: Not on file  Occupational History  . Not on file  Social Needs  . Financial resource strain: Not on file  . Food insecurity:    Worry: Not on file    Inability: Not on file  . Transportation needs:    Medical: Not on file    Non-medical: Not on file  Tobacco Use  . Smoking status: Former Smoker    Packs/day: 0.50    Years: 5.00    Pack years: 2.50    Types: Cigarettes    Last attempt to quit: 06/27/2000    Years since quitting: 17.8  . Smokeless tobacco: Never Used  Substance and Sexual  Activity  . Alcohol use: Yes    Alcohol/week: 2.0 standard drinks    Types: 2 Shots of liquor per week  . Drug use: No  . Sexual activity: Yes    Birth control/protection: None  Lifestyle  . Physical activity:    Days per week: Not on file    Minutes per session: Not on file  . Stress: Not on file  Relationships  . Social connections:    Talks on phone: Not on file    Gets together: Not on file    Attends religious service: Not on file    Active member of club or organization: Not on file    Attends meetings of clubs or organizations: Not on file    Relationship status: Not on file  Other Topics Concern  . Not on file  Social  History Narrative   ** Merged History Encounter **        Her Allergies Are:  Allergies  Allergen Reactions  . Latex Swelling  . Latex Rash  :   Her Current Medications Are:  Outpatient Encounter Medications as of 04/24/2018  Medication Sig  . fluticasone (FLONASE) 50 MCG/ACT nasal spray 1 spray each nostril twice daily after sinus rinses  . Krill Oil 500 MG CAPS Take 500 mg by mouth daily.  Marland Kitchen loperamide (IMODIUM A-D) 2 MG tablet TAKE 2 TABS NOW AS NEED FOR LOOSE STOOLS AND THEN 1 TABLET EVERY HR AS NEEDED (MAX 8 TABS DAILY)  . sertraline (ZOLOFT) 50 MG tablet Take 1.5 tablets (75 mg total) by mouth daily.  . traZODone (DESYREL) 50 MG tablet Take 1 tablet (50 mg total) by mouth at bedtime as needed for sleep.   No facility-administered encounter medications on file as of 04/24/2018.   :  Review of Systems:  Out of a complete 14 point review of systems, all are reviewed and negative with the exception of these symptoms as listed below:  Review of Systems  Neurological:       Pt presents today to discuss her MRI results. She was told her that pituitary abnormality is better but that she has white matter changes.    Objective:  Neurological Exam  Physical Exam Physical Examination:   Vitals:   04/24/18 1414  BP: 140/87  Pulse: 93    General Examination: The patient is a very pleasant 47 y.o. female in no acute distress. She appears well-developed and well-nourished and well groomed. She is anxious appearing, near tears at times when talking about dad.repeat blood pressure at the end of the visit was 124/78.   HEENT: Normocephalic, atraumatic, pupils are equal, round and reactive to light and accommodation. Extraocular tracking is good without limitation to gaze excursion or nystagmus noted. Normal smooth pursuit is noted. Hearing is grossly intact. Face is symmetric with normal facial animation and normal facial sensation. Speech is clear with no dysarthria noted. There is no hypophonia. There is no lip, neck/head, jaw or voice tremor. Neck is supple with full range of passive and active motion. Oropharynx exam reveals: no mouth dryness, good dental hygiene and no significant airway crowding.   Chest: Clear to auscultation without wheezing, rhonchi or crackles noted.  Heart: S1+S2+0, regular and normal without murmurs, rubs or gallops noted.   Abdomen: Soft, non-tender and non-distended with normal bowel sounds appreciated on auscultation.  Extremities: There is no pitting edema in the distal lower extremities bilaterally.   Skin: Warm and dry without trophic changes noted.  Musculoskeletal: exam reveals no obvious joint deformities, tenderness or joint swelling or erythema.   Neurologically:  Mental status: The patient is awake, alert and oriented in all 4 spheres. Her immediate and remote memory, attention, language skills and fund of knowledge are appropriate. There is no evidence of aphasia, agnosia, apraxia or anomia. Speech is clear with normal prosody and enunciation. Thought process is linear. Mood is normal and affect is normal. Slightly pressured speech. Cranial nerves II - XII are as described above under HEENT exam. In addition: shoulder shrug is normal with equal shoulder height noted. Motor exam:  Normal bulk, strength and tone is noted. There is no tremor or rebound. Romberg is negative. Reflexes are 2+ throughout. Fine motor skills and coordination: grossly intact with normal finger taps, normal hand movements, normal rapid alternating patting, normal foot taps and normal foot agility.  Cerebellar  testing: No dysmetria or intention tremor. There is no truncal or gait ataxia.  Sensory exam: intact to light touch in the upper and lower extremities.  Gait, station and balance: She stands easily. No veering to one side is noted. No leaning to one side is noted. Posture is age-appropriate and stance is narrow based. Gait shows normal stride length and normal pace. No problems turning are noted.   Assessment and Plan:  In summary, Avalene Sealy is a very pleasant 47 year old female with an underlying medical history of anxiety, reflux disease, and history of pituitary microadenoma, who presents for a new problem visit after her recent brain MRI as ordered by her endocrinologist. Dema Severin matter changes were seen on the MRI. I explained the findings to her. She has had a good lipid profile, she has had good A1c, she is at a healthy weight. She tries to stay active physically and tries to hydrate well with water. All of these a very favorable. She does not have a history of hypertension. She did go to the ER some 5 months ago due to chest pain. She did report at one point during the office visit that she had a little bit of left chest discomfort. She was encouraged to have it checked out through the emergency room as we cannot do any additional workup for an acute cardiac event in the office. She was encouraged to go to the ER for this, repeat blood pressure was fine. She had no focal neurological findings. We talked about risk factors for white matter changes which are typically vascular risk factors, smoking, migraine headaches. She does have a history of smoking with an at least 10-pack-year history. She is  furthermore advised to reduce her alcohol intake and never utilize alcohol as a sleep aid. We have talked about this extensively before. She is advised to follow up routinely with her endocrinologist and her primary care physician. I will see her back as needed. I answered all her questions today and she was in agreement. I spent 25 minutes in total face-to-face time with the patient, more than 50% of which was spent in counseling and coordination of care, reviewing test results, reviewing medication and discussing or reviewing the diagnosis of WM changes, the prognosis and treatment options. Pertinent laboratory and imaging test results that were available during this visit with the patient were reviewed by me and considered in my medical decision making (see chart for details).

## 2018-04-24 NOTE — Patient Instructions (Signed)
As discussed regarding your brain scan: white matter changes or microvascular changes are commonly seen. These are tiny white spots, that occur with time and are seen in a variety of conditions, including with normal aging, chronic hypertension, chronic headaches, smoking, especially migraine HAs, chronic diabetes, chronic hyperlipidemia. These are not strokes and no mass or new lesion was seen, in fact, your microadenoma looks better. Again, there were no acute findings, such as a stroke, or mass or blood products. No further action is required on this test at this time, other than re-enforcing the importance of good blood pressure control, good cholesterol control, good blood sugar control, and weight management. All of this looks very good in your case.  Please continue to take care of yourself the way you are. Please reduce your alcohol intake.

## 2018-07-09 ENCOUNTER — Ambulatory Visit (INDEPENDENT_AMBULATORY_CARE_PROVIDER_SITE_OTHER): Payer: BLUE CROSS/BLUE SHIELD | Admitting: Family Medicine

## 2018-07-09 ENCOUNTER — Encounter: Payer: Self-pay | Admitting: Family Medicine

## 2018-07-09 VITALS — BP 111/77 | HR 81 | Temp 98.3°F | Ht 63.0 in | Wt 150.0 lb

## 2018-07-09 DIAGNOSIS — F438 Other reactions to severe stress: Secondary | ICD-10-CM

## 2018-07-09 DIAGNOSIS — F411 Generalized anxiety disorder: Secondary | ICD-10-CM

## 2018-07-09 DIAGNOSIS — G479 Sleep disorder, unspecified: Secondary | ICD-10-CM

## 2018-07-09 DIAGNOSIS — J069 Acute upper respiratory infection, unspecified: Secondary | ICD-10-CM | POA: Diagnosis not present

## 2018-07-09 DIAGNOSIS — R059 Cough, unspecified: Secondary | ICD-10-CM | POA: Insufficient documentation

## 2018-07-09 DIAGNOSIS — F4389 Other reactions to severe stress: Secondary | ICD-10-CM

## 2018-07-09 DIAGNOSIS — R05 Cough: Secondary | ICD-10-CM

## 2018-07-09 MED ORDER — HYDROCOD POLST-CPM POLST ER 10-8 MG/5ML PO SUER: 5.0000 mL | Freq: Two times a day (BID) | ORAL | 0 refills | Status: DC | PRN

## 2018-07-09 MED ORDER — PREDNISONE 20 MG PO TABS: ORAL_TABLET | ORAL | 0 refills | Status: DC

## 2018-07-09 MED ORDER — AMOXICILLIN-POT CLAVULANATE 875-125 MG PO TABS: 1.0000 | ORAL_TABLET | Freq: Two times a day (BID) | ORAL | 0 refills | Status: AC

## 2018-07-09 MED ORDER — BUSPIRONE HCL 15 MG PO TABS: 15.0000 mg | ORAL_TABLET | Freq: Two times a day (BID) | ORAL | 0 refills | Status: DC

## 2018-07-09 NOTE — Progress Notes (Signed)
Acute Care Office visit  Assessment and plan:  1. Upper respiratory tract infection, unspecified type- >er 12 d sx, No Improvmnt   2. GAD (generalized anxiety disorder)   3. Stress-related physiological response affecting physical condition   4. Sleeping difficulty   5. Cough     1. URI - Viral vs Allergic vs Bacterial causes for pt's symptoms reveiwed.  All questions were answered.   - Antibiotics prescribed today since symptoms have continued past 10 days and worsening. - Steroids prescribed today.  Patient will start pills today.  - Patient knows to call if she needs prescription for inhaler.  - Supportive care and various OTC medications discussed in addition to any prescribed.  - Call or RTC if new symptoms, or if no improvement or worse over next several days.  2. Mood Management - Mood stable at this time, managed on Zoloft and Buspar. - Continue treatment plan as prescribed.  See med list below. - Patient tolerating meds well without complication.  Denies S-E  - Patient knows she may take more Buspar prudently as recommended for anxiety. - Patient knows to keep [place] updated on her med list.  Reviewed this with patient extensively today.    - Refills provided today.   Meds ordered this encounter  Medications  . amoxicillin-clavulanate (AUGMENTIN) 875-125 MG tablet    Sig: Take 1 tablet by mouth 2 (two) times daily for 10 days.    Dispense:  20 tablet    Refill:  0  . chlorpheniramine-HYDROcodone (TUSSIONEX) 10-8 MG/5ML SUER    Sig: Take 5 mLs by mouth every 12 (twelve) hours as needed for cough (cough, will cause drowsiness.).    Dispense:  120 mL    Refill:  0  . predniSONE (DELTASONE) 20 MG tablet    Sig: Take 3 tabs po * 2 days, then 2 tabs for 2 d, then 1 tab 2 d, then 1/2 tab 2 days.    Dispense:  15 tablet    Refill:  0  . busPIRone (BUSPAR) 15 MG tablet    Sig: Take 1 tablet (15 mg total) by mouth 2 (two) times daily.    Dispense:  180 tablet   Refill:  0    Medications Discontinued During This Encounter  Medication Reason  . busPIRone HCl (BUSPAR PO)   . traZODone (DESYREL) 50 MG tablet Patient Preference  . busPIRone (BUSPAR) 15 MG tablet Reorder     Gross side effects, risk and benefits, and alternatives of medications discussed with patient.  Patient is aware that all medications have potential side effects and we are unable to predict every sideeffect or drug-drug interaction that may occur.  Expresses verbal understanding and consents to current therapy plan and treatment regiment.   Education and routine counseling performed. Handouts provided.  Anticipatory guidance and routine counseling done re: condition, txmnt options and need for follow up. All questions of patient's were answered.  Return for f/up 59mo or so for mood, insomnia, med check.  Please see AVS handed out to patient at the end of our visit for additional patient instructions/ counseling done pertaining to today's office visit.  Note:  This document was partially repared using Dragon voice recognition software and may include unintentional dictation errors.  This document serves as a record of services personally performed by [name], DO. It was created on her behalf by Toni Amend, a trained medical scribe. The creation of this record is based on the scribe's personal observations  and the provider's statements to them.   I have reviewed the above medical documentation for accuracy and completeness and I concur.  [name], DO 07/09/2018 5:05 PM       Subjective:    Chief Complaint  Patient presents with  . Sinus Problem    1. URI  HPI:  Pt presents with Sx for 12-14 days.   C/o:  Raw throat, "raw feeling," and "it hurt to cough to try to get it up, it was a struggle."  Also experienced headaches.  Started coughing stuff up after she took the Mucinex.  Now constantly blowing her nose.  Mild pain on sinus  palpation.  Head "feels like she's been in a bubble."  Now her ears feel stopped up.  Cough has dried up some, and chest started hurting a little bit.  Coughed up something "a little more than yellow."  Cough, head congestion, chest tightness.  Glands have been "swollen and very sore to the touch."  Hurts bilaterally.  Denies:  Fevers.    For symptoms patient has tried:  Mucinex, allergy medicine, "a bottle of robitussin."  Overall getting:  "Feels like I'm where I started, but it's dry."  2. Mood Management: Father died of cancer in 05/19/2023.  He was age 28.   Patient states he had melanoma and it spread to his bones and liver.  Continues taking 100 mg of Zoloft.  Taking one to one-and-a-half tablets of Buspar at night along with an herbal sleep aid.  Notes this is "hit or miss," but she couldn't get the dosage right with trazodone.  Says she's had her rough days, "but I've been good considering."   Patient Care Team    Relationship Specialty Notifications Start End  [name], DO PCP - General Family Medicine  12/10/15   Brien Few, MD Consulting Physician Obstetrics and Gynecology  05/16/17   Merrilee Seashore, MD Consulting Physician Internal Medicine  05/16/17    Comment: Actually sees Dr[facility] Felicie Morn at [facility]     Past medical history, Surgical history, Family history reviewed and noted below, Social history, Allergies, and Medications have been entered into the medical record, reviewed and changed as needed.   Allergies  Allergen Reactions  . Latex Swelling  . Latex Rash    Review of Systems: - see above HPI for pertinent positives General:   No F/C, wt loss Pulm:   No DIB, pleuritic chest pain Card:  No CP, palpitations Abd:  No n/v/d or pain Ext:  No inc edema from baseline   Objective:   Blood pressure 111/77, pulse 81, temperature 98.3 F (36.8 C), height 5\' 3"  (1.6 m), weight 150 lb (68 kg), last menstrual period 07/09/2018, SpO2  98 %. Body mass index is 26.57 kg/m. General: Well Developed, well nourished, appropriate for stated age.  Neuro: Alert and oriented x3, extra-ocular muscles intact, sensation grossly intact.  HEENT: Normocephalic, atraumatic, pupils equal round reactive to light, neck supple, no masses, no painful lymphadenopathy, TM's intact B/L, no acute findings. Nares- patent, clear d/c, OP- clear, mild erythema, No TTP sinuses Skin: Warm and dry, no gross rash. Cardiac: RRR, S1 S2,  no murmurs rubs or gallops.  Respiratory: ECTA B/L and A/P, Not using accessory muscles, speaking in full sentences- unlabored. Vascular:  No gross lower ext edema, cap RF less 2 sec. Psych: No HI/SI, judgement and insight good, Euthymic mood. Full Affect.

## 2018-07-11 ENCOUNTER — Other Ambulatory Visit: Payer: Self-pay | Admitting: Family Medicine

## 2018-07-11 DIAGNOSIS — G479 Sleep disorder, unspecified: Secondary | ICD-10-CM

## 2018-07-11 NOTE — Telephone Encounter (Signed)
Please call and clarify with patient that she goes days without sleeping and still is able to somewhat function during the day.  If this is the case, please tell her I rec psych iatry referral and evaluation for other conditions that can be contributing to her concerns.   Otherwise, if that is not the case then okay to give her Lunesta or Rozerem what ever she feels is better for her

## 2018-07-11 NOTE — Telephone Encounter (Signed)
Spoke to patient she states that she has stopped all ETOH use and has tried trazodone 1 tab with 2 tabs of the buspar and was still wide awake all night.  Patient then tried Unisom with the buspar and was again unable to go to sleep.  Patient states that she goes days without sleeping.  Patient would like to know if Lunesta or Ramelteon (Rozerem) would be an option for her to try.  LOV 07/09/2018.  Please review and advise. MPulliam, CMA/RT(R)

## 2018-07-11 NOTE — Telephone Encounter (Signed)
Called patient left message to call the office. MPulliam, CMA/RT(R)  

## 2018-07-11 NOTE — Telephone Encounter (Signed)
Patient is requesting a call back from clinic staff on sleep troubles that was discussed at last OV, she wants to follow up with nurse to give some additional information and what we can do to help. Patient is hoping for a call back today if at possible. Please advise

## 2018-07-11 NOTE — Telephone Encounter (Signed)
If the patient cannot go days without sleeping and function well, then I do not think she needs further evaluation and I would recommend either Lunesta at 1 mg Sigg 1 to 2 mg nightly as needed sleep dispense 90 no refill.  Or Rozerem 8 mg tablet Sigg 1/2 to 1 tablet nightly as needed dispense 90 no refill  Please let patient know I cannot refill these medicines without reevaluating her and documenting she is tolerating these meds well without side effect.  Thank you

## 2018-07-12 ENCOUNTER — Telehealth: Payer: Self-pay | Admitting: Family Medicine

## 2018-07-12 MED ORDER — ESZOPICLONE 1 MG PO TABS: 1.0000 mg | ORAL_TABLET | Freq: Every evening | ORAL | 0 refills | Status: DC | PRN

## 2018-07-12 NOTE — Addendum Note (Signed)
Addended by: Lanier Prude D on: 07/12/2018 02:30 PM   Modules accepted: Orders

## 2018-07-12 NOTE — Telephone Encounter (Signed)
Spoke to the patient and she states that she goes days at a time with very little sleep, she may sleep 1-2 hours and she is not able to function well at these times.  Patient states that she has to stay home if the sleep difficulty lasts multiple days. Patient would like to try the Lunesta at this time.  Patient was notified that she would have to follow up in the office with Dr. Raliegh Scarlet before any refills.   Per Dr. Hershal Coria note below ok to give Lunesta @ 1 mg sig 1-2 mg at night as needed for sleep # 90 no refills.   I am unable to send this in as it is controled. Can you please review and advise. Thanks MPulliam, CMA/RT(R)

## 2018-07-12 NOTE — Telephone Encounter (Signed)
Patient called state she spoke w/ medical assistant today requesting Rx for Lunesta sleep aid medication & for it to be called into :    [facility] [place] [phone] (Phone) [phone] (Fax)   --forwarding message to medical assistant.  --Dion Body

## 2018-07-13 MED ORDER — ESZOPICLONE 1 MG PO TABS: 1.0000 mg | ORAL_TABLET | Freq: Every evening | ORAL | 0 refills | Status: DC | PRN

## 2018-07-13 NOTE — Telephone Encounter (Signed)
I will fill, all future refills will need to be completed by Dr. Raliegh Scarlet  Thanks! Valetta Fuller

## 2018-07-13 NOTE — Telephone Encounter (Signed)
Medication sent into pharmacy left message to notify the patient. MPulliam, CMA/RT(R)

## 2018-07-16 ENCOUNTER — Telehealth: Payer: Self-pay

## 2018-07-16 NOTE — Telephone Encounter (Signed)
Pharmacy sent over fax to notify that insurance does not cover lunesta for more than once daily.  [place] [employer]rolled substance data based and pharmacy called -  Patient received #30 on 07/13/2017. This should last the patient 15-30 days.  Called patient and notified her that Dr. Raliegh Scarlet is out of the office today and for her to call me before she runs out of medication to notify if it is working, if she is tolerating, and how much she is needing to take.  At that time we will send in refill based on this information. MPulliam, CMA/RT(R)

## 2018-07-17 ENCOUNTER — Telehealth: Payer: Self-pay

## 2018-07-17 NOTE — Telephone Encounter (Signed)
Spoke to patient today and she states that last night she took 2 of the Lunesta 1 mg @ 8 PM patient was unable to go to sleep and @ 1030 PM took 1 buspar and 1 unisom.  Patient was able to get to by 12 AM and slept until 730 AM.  Patient states that she tolerated all medications well with little to no drowsiness the following morning. MPulliam, CMA/RT(R)

## 2018-07-17 NOTE — Telephone Encounter (Signed)
Called patient and notified per Dr. Raliegh Scarlet that this is too much medication and advise that she try the 2 lunesta along with limiting screen time, medication, soothing music, etc. MPulliam, CMA/RT(R)

## 2018-07-17 NOTE — Telephone Encounter (Signed)
Spoke to patient today and she states that last night she took 2 of the Lunesta 1 mg @ 8 PM patient was unable to go to sleep and @ 1030 PM took 1 buspar and 1 unisom.  Patient was able to get to by 12 AM and slept until 730 AM.  Patient states that she tolerated all medications well with little to no drowsiness the following morning.  Please review and advise if dose needs to be changed.   MPulliam, CMA/RT(R)    Message from 07/16/2018.  Pharmacy sent over fax to notify that insurance does not cover lunesta for more than once daily.  [place] [facility] substance [facility] based and pharmacy called -  Patient received #30 on 07/13/2017. This should last the patient 15-30 days.  Called patient and notified her that Dr. Raliegh Scarlet is out of the office today and for her to call me before she runs out of medication to notify if it is working, if she is tolerating, and how much she is needing to take.  At that time we will send in refill based on this information. MPulliam, CMA/RT(R)

## 2018-07-19 ENCOUNTER — Telehealth: Payer: Self-pay | Admitting: Family Medicine

## 2018-07-19 DIAGNOSIS — G479 Sleep disorder, unspecified: Secondary | ICD-10-CM

## 2018-07-19 NOTE — Telephone Encounter (Signed)
Patient states that she has tried all recommendations with the Lunesta.  Please review and advise. MPulliam, CMA/RT(R)

## 2018-07-19 NOTE — Telephone Encounter (Signed)
Xan states the Connie Cole is not working and wishes to try Ambien.  Please call patient and let her know if this is possible.

## 2018-07-20 MED ORDER — ZOLPIDEM TARTRATE 5 MG PO TABS: 5.0000 mg | ORAL_TABLET | Freq: Every evening | ORAL | 0 refills | Status: DC | PRN

## 2018-07-20 NOTE — Telephone Encounter (Signed)
ambien 5mg   1 po qhs Disp 90 No RF  - please remind pt that sleep hygiene, quieting her brain etc IS SO IMPORTANT to a good nights sleep.  Please make sure she is NOT exposed to any computer screens/ cell phone screens or TV's w/in 2 hrs of going to sleep.  Get blue light blocking glasses.     - Meds is NOT the end-all be all with sleep - she needs to work on these things in addition to the meds-   MEDS alone will not work!!

## 2018-07-20 NOTE — Telephone Encounter (Signed)
Per note put in for Ambien.  RX printed, unable to get provider to sign until back in the office Monday.   MPulliam, CMA/RT(R)

## 2018-07-20 NOTE — Telephone Encounter (Signed)
Was able to call in the medication patietn notified. MPulliam, CMA/RT(R)

## 2018-07-24 ENCOUNTER — Encounter: Payer: Self-pay | Admitting: Family Medicine

## 2018-07-25 ENCOUNTER — Telehealth: Payer: Self-pay

## 2018-07-25 ENCOUNTER — Telehealth: Payer: Self-pay | Admitting: Family Medicine

## 2018-07-25 NOTE — Telephone Encounter (Signed)
Patient called states Melissa advised her to track sleeping record -- per patient

## 2018-07-25 NOTE — Telephone Encounter (Signed)
Patient called and states that she has been taking the Ambien and it is not working.  Patient states that on the night 1/25 she took 5 mg at 845 and layed down in the bed at 1030 and was still awake at 11pm; no phone, computer, or tv on before trying to go to sleep. Patient then took another 1/2 tablet and finally got to sleep but it was very restless.  The next night patient took 1.5 tablets and was still up most of the night.  Patient wants to know if she can increase to 10 mg nightly to see if that works and wants to know if she can also take melatonin with the medication.   Per verbal talk with Dr. 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  Patient may take 10 mg of the Ambien but not with the melatonin.  Patient needs to know the more of these medications she takes the less the medication will work.   Called patient and left message to call the office back. MPulliam, CMA/RT(R)

## 2018-07-25 NOTE — Telephone Encounter (Signed)
Spoke to patient and notified her of the pervious message. Advised to just take the 10 mg of the Ambien alone tonight and follow up as needed. MPulliam, CMA/RT(R)

## 2018-07-25 NOTE — Telephone Encounter (Signed)
Per patient she was told to track sleeping & call back to give report.  --Forwarding message to [facility] that :  Patient took 10 MG of Ambien  & 10MG  of  Melatonin as sleep aid but was awake til 4am this morning,she did manage to sleep 62min before husband woke for work-- Per patient she thinks slept total of 2 hrs last night.   --glh

## 2018-08-15 ENCOUNTER — Other Ambulatory Visit: Payer: Self-pay

## 2018-08-15 ENCOUNTER — Telehealth: Payer: Self-pay | Admitting: Family Medicine

## 2018-08-15 ENCOUNTER — Other Ambulatory Visit: Payer: Self-pay | Admitting: Family Medicine

## 2018-08-15 DIAGNOSIS — F411 Generalized anxiety disorder: Secondary | ICD-10-CM

## 2018-08-15 MED ORDER — SERTRALINE HCL 50 MG PO TABS: 100.0000 mg | ORAL_TABLET | Freq: Every day | ORAL | 0 refills | Status: DC

## 2018-08-15 NOTE — Telephone Encounter (Signed)
Patient is requesting a refill oh her Zoloft, she is going out of town tomorrow and will be out during this time without a refill. She states she takes 2 50 mg tabs daily and wanted me to verify that is her current dosage in the system. If approved please send to [facility] on [address]

## 2018-08-15 NOTE — Telephone Encounter (Signed)
Refill sent in for the patient. MPulliam, CMA/RT(R)  

## 2018-08-27 ENCOUNTER — Ambulatory Visit
Admission: EM | Admit: 2018-08-27 | Discharge: 2018-08-27 | Disposition: A | Discharge status: Home / Self Care | Payer: BLUE CROSS/BLUE SHIELD | Attending: Emergency Medicine | Admitting: Emergency Medicine

## 2018-08-27 DIAGNOSIS — J101 Influenza due to other identified influenza virus with other respiratory manifestations: Secondary | ICD-10-CM | POA: Diagnosis not present

## 2018-08-27 LAB — POCT INFLUENZA A/B
Influenza A, POC: POSITIVE — AB
Influenza B, POC: NEGATIVE

## 2018-08-27 MED ORDER — IBUPROFEN 600 MG PO TABS: 600.0000 mg | ORAL_TABLET | Freq: Four times a day (QID) | ORAL | 0 refills | Status: DC | PRN

## 2018-08-27 MED ORDER — ALBUTEROL SULFATE HFA 108 (90 BASE) MCG/ACT IN AERS: 1.0000 | INHALATION_SPRAY | Freq: Four times a day (QID) | RESPIRATORY_TRACT | 0 refills | Status: DC | PRN

## 2018-08-27 MED ORDER — AEROCHAMBER PLUS MISC: 2 refills | Status: DC

## 2018-08-27 MED ORDER — OSELTAMIVIR PHOSPHATE 75 MG PO CAPS: 75.0000 mg | ORAL_CAPSULE | Freq: Two times a day (BID) | ORAL | 0 refills | Status: DC

## 2018-08-27 MED ORDER — FLUTICASONE PROPIONATE 50 MCG/ACT NA SUSP: 2.0000 | Freq: Every day | NASAL | 0 refills | Status: DC

## 2018-08-27 NOTE — Discharge Instructions (Addendum)
Continue saline nasal irrigation, Mucinex, start Flonase.  2 puffs from your albuterol inhaler every 4-6 hours as needed for coughing, wheezing.  Ibuprofen 600 mg combined with 1 g of Tylenol 3-4 times a day as needed.  Push plenty of electrolyte containing fluids.  Finish Tamiflu even if you feel better.

## 2018-08-27 NOTE — ED Provider Notes (Signed)
HPI  SUBJECTIVE:  Connie Cole is a 48 y.o. female who presents with 3 days of body aches, decreased appetite, cough productive of yellowish-greenish sputum, fevers T-max 101.4, yellowish nasal congestion, rhinorrhea, postnasal drip, sinus pain and pressure.  She reports headache, chest soreness and wheezing.  She reports a sore throat.  No neck stiffness. no Shortness of breath, dyspnea on exertion.  No vomiting.  Reports diarrhea yesterday.  She has tried Mucinex, Tylenol, saline nasal irrigation.  Mucinex and Tylenol help.  She took Tylenol within 4 to 6 hours of evaluation.  No aggravating factors.  States that she is sleeping okay at night without waking up coughing.  She is a former smoker.  She has a history of GERD and frequent strep.  No history of asthma, emphysema, COPD, diabetes, hypertension.  LMP: Now.  Denies the possibility being pregnant.  PMD: [name], DO   Past Medical History:  Diagnosis Date  . Arthritis    hands, hips  . GERD (gastroesophageal reflux disease)    occasional diet controlled- no med  . Palpitations    wore heart monitor x 1 wk, unknown - stress related    Past Surgical History:  Procedure Laterality Date  . COLONOSCOPY     polyp  . DILITATION & CURRETTAGE/HYSTROSCOPY WITH HYDROTHERMAL ABLATION N/A 06/03/2016   Procedure: DILATATION & CURETTAGE/HYSTEROSCOPY WITH HYDROTHERMAL ABLATION;  Surgeon: Brien Few, MD;  Location: [facility];  Service: Gynecology;  Laterality: N/A;  . IR RADIOLOGIST EVAL & MGMT  09/06/2016  . TONSILLECTOMY      Family History  Problem Relation Age of Onset  . Stroke Mother   . Heart disease Mother   . Hypertension Mother   . Cancer Father        eye  . Healthy Sister     Social History   Tobacco Use  . Smoking status: Former Smoker    Packs/day: 0.50    Years: 5.00    Pack years: 2.50    Types: Cigarettes    Last attempt to quit: 06/27/2000    Years since quitting: 18.1  . Smokeless tobacco: Never Used   Substance Use Topics  . Alcohol use: Yes    Alcohol/week: 2.0 standard drinks    Types: 2 Shots of liquor per week  . Drug use: No    No current facility-administered medications for this encounter.   Current Outpatient Medications:  .  albuterol (PROVENTIL HFA;VENTOLIN HFA) 108 (90 Base) MCG/ACT inhaler, Inhale 1-2 puffs into the lungs every 6 (six) hours as needed for wheezing or shortness of breath., Disp: 1 Inhaler, Rfl: 0 .  busPIRone (BUSPAR) 15 MG tablet, Take 1 tablet (15 mg total) by mouth 2 (two) times daily., Disp: 180 tablet, Rfl: 0 .  fluticasone (FLONASE) 50 MCG/ACT nasal spray, Place 2 sprays into both nostrils daily., Disp: 16 g, Rfl: 0 .  ibuprofen (ADVIL,MOTRIN) 600 MG tablet, Take 1 tablet (600 mg total) by mouth every 6 (six) hours as needed., Disp: 30 tablet, Rfl: 0 .  Krill Oil 500 MG CAPS, Take 500 mg by mouth daily., Disp: , Rfl:  .  oseltamivir (TAMIFLU) 75 MG capsule, Take 1 capsule (75 mg total) by mouth 2 (two) times daily. X 5 days, Disp: 10 capsule, Rfl: 0 .  sertraline (ZOLOFT) 50 MG tablet, Take 2 tablets (100 mg total) by mouth daily., Disp: 180 tablet, Rfl: 0 .  Spacer/Aero-Holding Chambers (AEROCHAMBER PLUS) inhaler, Use as instructed, Disp: 1 each, Rfl: 2  Allergies  Allergen Reactions  . Latex Swelling  . Latex Rash     ROS  As noted in HPI.   Physical Exam  BP 109/75 (BP Location: Right Arm)   Pulse 90   Temp 98.2 F (36.8 C) (Oral)   Resp 12   SpO2 96%   Constitutional: Well developed, well nourished, no acute distress Eyes: PERRL, EOMI, conjunctiva normal bilaterally HENT: Normocephalic, atraumatic,mucus membranes moist. positive clear nasal congestion.  Normal turbinates.  Positive maxillary and frontal sinus tenderness, patient states that this is not new.  Erythematous oropharynx, tonsils normal without exudates.  Uvula midline.  No obvious postnasal drip, cobblestoning. Neck: No cervical adenopathy, meningismus. Respiratory:  Clear to auscultation bilaterally, no rales, no wheezing, no rhonchi and anterior lateral chest wall tenderness Cardiovascular: Normal rate and rhythm, no murmurs, no gallops, no rubs GI: Nondistended skin: No rash, skin intact Musculoskeletal: No edema, no tenderness, no deformities Neurologic: Alert & oriented x 3, CN II-XII grossly intact, no motor deficits, sensation grossly intact Psychiatric: Speech and behavior appropriate   ED Course   Medications - No data to display  Orders Placed This Encounter  Procedures  . POCT Influenza A/B    Standing Status:   Standing    Number of Occurrences:   1   Results for orders placed or performed during the hospital encounter of 08/27/18 (from the past 24 hour(s))  POCT Influenza A/B     Status: Abnormal   Collection Time: 08/27/18 11:21 AM  Result Value Ref Range   Influenza A, POC Positive (A) Negative   Influenza B, POC Negative Negative   No results found.  ED Clinical Impression  Influenza A   ED Assessment/Plan  Sinus tenderness noted, however patient states that this is an ongoing issue.  Patient flu a positive.  She is almost out of the window of Tamiflu, but will prescribe this along with ibuprofen 600 mg combined with 1 g of Tylenol, continue saline nasal irrigation with a Milta Deiters med sinus rinse, Flonase, 2 puffs from an albuterol inhaler using her spacer every 4 to 6 hours as needed.  Work note for 2 days.  Follow-up with PMD as needed.  Discussed labs, MDM, treatment plan,  with patient. patient agrees with plan.   Meds ordered this encounter  Medications  . albuterol (PROVENTIL HFA;VENTOLIN HFA) 108 (90 Base) MCG/ACT inhaler    Sig: Inhale 1-2 puffs into the lungs every 6 (six) hours as needed for wheezing or shortness of breath.    Dispense:  1 Inhaler    Refill:  0  . fluticasone (FLONASE) 50 MCG/ACT nasal spray    Sig: Place 2 sprays into both nostrils daily.    Dispense:  16 g    Refill:  0  .  Spacer/Aero-Holding Chambers (AEROCHAMBER PLUS) inhaler    Sig: Use as instructed    Dispense:  1 each    Refill:  2  . oseltamivir (TAMIFLU) 75 MG capsule    Sig: Take 1 capsule (75 mg total) by mouth 2 (two) times daily. X 5 days    Dispense:  10 capsule    Refill:  0  . ibuprofen (ADVIL,MOTRIN) 600 MG tablet    Sig: Take 1 tablet (600 mg total) by mouth every 6 (six) hours as needed.    Dispense:  30 tablet    Refill:  0    *This clinic note was created using [employer]. Therefore, there may be occasional mistakes despite careful proofreading.  ?  Melynda Ripple, MD 08/27/18 [phone]

## 2018-08-27 NOTE — ED Triage Notes (Signed)
Patient complains of chills which began Friday, sore throat, mild cough with green/yellow sputum, and sweats X 2 days. Patient has been running a fever at home, took Tylenol this morning.

## 2018-08-31 ENCOUNTER — Ambulatory Visit (INDEPENDENT_AMBULATORY_CARE_PROVIDER_SITE_OTHER): Payer: BLUE CROSS/BLUE SHIELD

## 2018-08-31 ENCOUNTER — Ambulatory Visit
Admission: EM | Admit: 2018-08-31 | Discharge: 2018-08-31 | Disposition: A | Discharge status: Home / Self Care | Payer: BLUE CROSS/BLUE SHIELD | Attending: Family Medicine | Admitting: Family Medicine

## 2018-08-31 ENCOUNTER — Encounter: Payer: Self-pay | Admitting: Emergency Medicine

## 2018-08-31 DIAGNOSIS — R918 Other nonspecific abnormal finding of lung field: Secondary | ICD-10-CM | POA: Diagnosis not present

## 2018-08-31 DIAGNOSIS — R69 Illness, unspecified: Secondary | ICD-10-CM | POA: Diagnosis not present

## 2018-08-31 DIAGNOSIS — J209 Acute bronchitis, unspecified: Secondary | ICD-10-CM

## 2018-08-31 DIAGNOSIS — J111 Influenza due to unidentified influenza virus with other respiratory manifestations: Secondary | ICD-10-CM

## 2018-08-31 MED ORDER — IPRATROPIUM-ALBUTEROL 0.5-2.5 (3) MG/3ML IN SOLN
3.0000 mL | Freq: Once | RESPIRATORY_TRACT | Status: AC
  Administered 2018-08-31: 3 mL via RESPIRATORY_TRACT

## 2018-08-31 MED ORDER — PREDNISONE 50 MG PO TABS: 50.0000 mg | ORAL_TABLET | Freq: Every day | ORAL | 0 refills | Status: AC

## 2018-08-31 MED ORDER — AZITHROMYCIN 250 MG PO TABS: 250.0000 mg | ORAL_TABLET | Freq: Every day | ORAL | 0 refills | Status: DC

## 2018-08-31 NOTE — ED Notes (Signed)
Patient able to ambulate independently  

## 2018-08-31 NOTE — Discharge Instructions (Signed)
No pneumonia Begin Prednisone daily for 5 days, this should help with inflammation and congestion in chest Continue inhaler as needed for shortness of breath and wheezing Begin azithromycin- 2 tablets today, 1 tablet for the following 4 days Continue Mucinex Continue Tylenol and ibuprofen as needed for fevers/headache  Follow-up if symptoms continue to not improve or worsen

## 2018-08-31 NOTE — ED Provider Notes (Signed)
[facility]    CSN: [id number] Arrival date & time: 08/31/18  1631     History   Chief Complaint Chief Complaint  Patient presents with  . Flu Follow Up  . Fever    HPI Elgie Landino is a 48 y.o. female history of previous tonsillectomy, GERD, generalized anxiety, presenting today for evaluation of persistent fever.  Patient was seen here on Monday, 5 days ago for influenza.  She was initiated on Tamiflu.  She has had persistent fevers since, thought the fever was breaking yesterday, but spiked again.  Noted a temperature of 100.5 this morning.  Took Tylenol and ibuprofen prior to arrival as she is also had persistent headaches.  Headaches located bilateral temples.  Denies any changes in vision.  She feels as if her cough is worse and is started to feel rattling in her chest.  She has had some mild chest tightness as well as a productive cough.  She has been using Flonase, Tamiflu, Mucinex as well as albuterol.  She does feel that albuterol has been helping some.  HPI  Past Medical History:  Diagnosis Date  . Arthritis    hands, hips  . GERD (gastroesophageal reflux disease)    occasional diet controlled- no med  . Palpitations    wore heart monitor x 1 wk, unknown - stress related    Patient Active Problem List   Diagnosis Date Noted  . Cough 07/09/2018  . Hyperprolactinemia ([place]) 03/06/2018  . Pain in joint of right hip 12/25/2017  . Environmental and seasonal allergies 11/15/2017  . Chronic pansinusitis 11/15/2017  . Cervical disc disorder 11/15/2017  . Muscle spasm- neck muscles 11/15/2017  . H/O: pituitary tumor 09/06/2017  . Sleeping difficulty 09/06/2017  . Subclinical hypothyroidism 09/06/2017  . Generalized osteoarthritis of multiple sites 09/06/2017  . Acute reaction to situational stress 05/16/2017  . GAD (generalized anxiety disorder) 05/16/2017  . Stress-related physiological response affecting physical condition 05/16/2017  . Numbness and  tingling in both arms/ hands 05/16/2017  . Non-cardiac chest pain- assoc with emotional stress 05/16/2017  . Palpitations 01/18/2016  . Family history of stroke (cerebrovascular)- mother 56's 01/18/2016  . Family history of atrial fibrillation 01/18/2016  . Tinea corporis (h/o tinea versicolor per pt) 12/20/2015  . Seborrheic dermatitis of scalp 12/16/2015  . GERD (gastroesophageal reflux disease) 12/16/2015    Past Surgical History:  Procedure Laterality Date  . COLONOSCOPY     polyp  . DILITATION & CURRETTAGE/HYSTROSCOPY WITH HYDROTHERMAL ABLATION N/A 06/03/2016   Procedure: DILATATION & CURETTAGE/HYSTEROSCOPY WITH HYDROTHERMAL ABLATION;  Surgeon: Brien Few, MD;  Location: [facility];  Service: Gynecology;  Laterality: N/A;  . IR RADIOLOGIST EVAL & MGMT  09/06/2016  . TONSILLECTOMY      OB History   No obstetric history on file.      Home Medications    Prior to Admission medications   Medication Sig Start Date End Date Taking? Authorizing Provider  albuterol (PROVENTIL HFA;VENTOLIN HFA) 108 (90 Base) MCG/ACT inhaler Inhale 1-2 puffs into the lungs every 6 (six) hours as needed for wheezing or shortness of breath. 08/27/18  Yes Melynda Ripple, MD  busPIRone (BUSPAR) 15 MG tablet Take 1 tablet (15 mg total) by mouth 2 (two) times daily. 07/09/18  Yes Opalski, Neoma Laming, DO  fluticasone (FLONASE) 50 MCG/ACT nasal spray Place 2 sprays into both nostrils daily. 08/27/18  Yes Melynda Ripple, MD  Krill Oil 500 MG CAPS Take 500 mg by mouth daily.   Yes [provider]  oseltamivir (TAMIFLU) 75 MG capsule Take 1 capsule (75 mg total) by mouth 2 (two) times daily. X 5 days 08/27/18  Yes Melynda Ripple, MD  sertraline (ZOLOFT) 50 MG tablet Take 2 tablets (100 mg total) by mouth daily. 08/15/18  Yes Opalski, Neoma Laming, DO  azithromycin (ZITHROMAX) 250 MG tablet Take 1 tablet (250 mg total) by mouth daily. Take first 2 tablets together, then 1 every day until finished. 08/31/18    ,  C, PA-C  ibuprofen (ADVIL,MOTRIN) 600 MG tablet Take 1 tablet (600 mg total) by mouth every 6 (six) hours as needed. 08/27/18   Melynda Ripple, MD  predniSONE (DELTASONE) 50 MG tablet Take 1 tablet (50 mg total) by mouth daily with breakfast for 5 days. 08/31/18 09/05/18  , Elesa Hacker, PA-C  Spacer/Aero-Holding Chambers (AEROCHAMBER PLUS) inhaler Use as instructed 08/27/18   Melynda Ripple, MD    Family History Family History  Problem Relation Age of Onset  . Stroke Mother   . Heart disease Mother   . Hypertension Mother   . Cancer Father        eye  . Healthy Sister     Social History Social History   Tobacco Use  . Smoking status: Former Smoker    Packs/day: 0.50    Years: 5.00    Pack years: 2.50    Types: Cigarettes    Last attempt to quit: 06/27/2000    Years since quitting: 18.1  . Smokeless tobacco: Never Used  Substance Use Topics  . Alcohol use: Yes    Alcohol/week: 2.0 standard drinks    Types: 2 Shots of liquor per week  . Drug use: No     Allergies   Latex and Latex   Review of Systems Review of Systems  Constitutional: Positive for fatigue and fever. Negative for activity change, appetite change and chills.  HENT: Positive for congestion and rhinorrhea. Negative for ear pain, sinus pressure, sore throat and trouble swallowing.   Eyes: Negative for discharge and redness.  Respiratory: Positive for cough and chest tightness. Negative for shortness of breath.   Cardiovascular: Negative for chest pain.  Gastrointestinal: Negative for abdominal pain, diarrhea, nausea and vomiting.  Musculoskeletal: Negative for myalgias.  Skin: Negative for rash.  Neurological: Positive for headaches. Negative for dizziness and light-headedness.     Physical Exam Triage Vital Signs ED Triage Vitals  Enc Vitals Group     BP 08/31/18 1641 117/78     Pulse Rate 08/31/18 1641 81     Resp 08/31/18 1641 14     Temp 08/31/18 1641 99.2 F (37.3 C)      Temp Source 08/31/18 1641 Oral     SpO2 08/31/18 1641 96 %     Weight --      Height --      Head Circumference --      Peak Flow --      Pain Score 08/31/18 1642 7     Pain Loc --      Pain Edu? --      Excl. in [place]? --    No data found.  Updated Vital Signs BP 117/78 (BP Location: Left Arm)   Pulse 81   Temp 99.2 F (37.3 C) (Oral)   Resp 14   LMP 08/31/2018   SpO2 96%   Visual Acuity Right Eye Distance:   Left Eye Distance:   Bilateral Distance:    Right Eye Near:   Left Eye Near:  Bilateral Near:     Physical Exam Vitals signs and nursing note reviewed.  Constitutional:      General: She is not in acute distress.    Appearance: She is well-developed.  HENT:     Head: Normocephalic and atraumatic.     Ears:     Comments: Bilateral ears without tenderness to palpation of external auricle, tragus and mastoid, EAC's without erythema or swelling, TM's with good bony landmarks and cone of light. Non erythematous.    Nose:     Comments: Nasal mucosa pink, no rhinorrhea    Mouth/Throat:     Comments: Oral mucosa pink and moist, no tonsillar enlargement or exudate. Posterior pharynx patent and nonerythematous, no uvula deviation or swelling. Normal phonation.  Eyes:     Conjunctiva/sclera: Conjunctivae normal.  Neck:     Musculoskeletal: Neck supple.  Cardiovascular:     Rate and Rhythm: Normal rate and regular rhythm.     Heart sounds: No murmur.  Pulmonary:     Effort: Pulmonary effort is normal. No respiratory distress.     Breath sounds: Wheezing and rhonchi present.     Comments: Breathing comfortably at rest, inconsistent wheezing and rhonchi throughout bilateral lung fields; occasional coarse cough in room Slight improvement in wheezing after DuoNeb, but still present Abdominal:     Palpations: Abdomen is soft.     Tenderness: There is no abdominal tenderness.  Skin:    General: Skin is warm and dry.  Neurological:     Mental Status: She is alert.       UC Treatments / Results  Labs (all labs ordered are listed, but only abnormal results are displayed) Labs Reviewed - No data to display  EKG None  Radiology Dg Chest 2 View  Result Date: 08/31/2018 CLINICAL DATA:  Persisted fever EXAM: CHEST - 2 VIEW COMPARISON:  None. FINDINGS: Diffuse interstitial opacity. No consolidation or effusion. Normal heart size. No pneumothorax. IMPRESSION: Mild diffuse interstitial opacities suggesting interstitial inflammatory process. No focal pneumonia Electronically Signed   By: Donavan Foil M.D.   On: 08/31/2018 17:04    Procedures Procedures (including critical care time)  Medications Ordered in UC Medications  ipratropium-albuterol (DUONEB) 0.5-2.5 (3) MG/3ML nebulizer solution 3 mL (3 mLs Nebulization Given 08/31/18 1708)    Initial Impression / Assessment and Plan / UC Course  I have reviewed the triage vital signs and the nursing notes.  Pertinent labs & imaging results that were available during my care of the patient were reviewed by me and considered in my medical decision making (see chart for details).    Patient tested positive for flu on Monday, still with persistent fevers, chest x-ray negative for pneumonia, lung suggestive of bronchitis.  Will treat as such.  Will initiate prednisone, given albuterol inhaler on Monday, left continue to use this.  We will also go out and initiate azithromycin to cover atypicals.  Continue symptomatic management, continue to rest and drink plenty of fluids.Discussed strict return precautions. Patient verbalized understanding and is agreeable with plan.   Final Clinical Impressions(s) / UC Diagnoses   Final diagnoses:  Influenza-like illness  Acute bronchitis, unspecified organism     Discharge Instructions     No pneumonia Begin Prednisone daily for 5 days, this should help with inflammation and congestion in chest Continue inhaler as needed for shortness of breath and wheezing Begin  azithromycin- 2 tablets today, 1 tablet for the following 4 days Continue Mucinex Continue Tylenol and ibuprofen as needed for  fevers/headache  Follow-up if symptoms continue to not improve or worsen    ED Prescriptions    Medication Sig Dispense Auth. Provider   predniSONE (DELTASONE) 50 MG tablet Take 1 tablet (50 mg total) by mouth daily with breakfast for 5 days. 5 tablet ,  C, PA-C   azithromycin (ZITHROMAX) 250 MG tablet Take 1 tablet (250 mg total) by mouth daily. Take first 2 tablets together, then 1 every day until finished. 6 tablet , [place] C, PA-C     Controlled Substance Prescriptions [place] Controlled Substance Registry consulted? Not Applicable   Janith Lima, [place] 08/31/18 1720

## 2018-08-31 NOTE — ED Triage Notes (Signed)
Pt presents to [facility] for assessment of continued fevers since Monday after being diagnosed with Flu A.  Pt states body aches have resolved, continues to have fever of approx 100.5-101.  States she took Tylenol and Ibuprofen PTA.  C/o more productive cough, increased "rattle" in her chest, and some SOB with exertion.

## 2018-09-03 ENCOUNTER — Telehealth: Payer: Self-pay | Admitting: Emergency Medicine

## 2018-09-03 NOTE — Telephone Encounter (Signed)
Pt called discussing continuation of 99.5 temperature and wondering if she is okay to go back to work.  Pt states cough and nausea still have not resolved.  Spoke to provider on site and encouraged patient to continue home rest until her temperatures and nausea have resolved.  Return precautions reviewed.

## 2018-09-24 ENCOUNTER — Other Ambulatory Visit: Payer: BLUE CROSS/BLUE SHIELD

## 2018-10-09 ENCOUNTER — Telehealth: Payer: Self-pay | Admitting: Family Medicine

## 2018-10-09 NOTE — Telephone Encounter (Signed)
This is a new medicine for new problem, patient needs an office visit.

## 2018-10-09 NOTE — Telephone Encounter (Signed)
Spoke to patient and she states that she is having back pain x 3 days.  No known injury, patient feels like she may have pulled something and is requesting a muscle relaxer. Please review and advise. MPulliam, CMA/RT(R)

## 2018-10-09 NOTE — Telephone Encounter (Signed)
Patient called states possible pulled (Low Back muscle) over weekend moving furniture (went to chiropractor for relief) but still experiencing some discomfort ask if provider can prescribe some muscle relaxer Rx.  --Forwarding message to medical assistant for review w/provider & to call pt at [phone] with questions or concerns.  --glh

## 2018-10-10 ENCOUNTER — Other Ambulatory Visit: Payer: Self-pay

## 2018-10-10 ENCOUNTER — Ambulatory Visit (INDEPENDENT_AMBULATORY_CARE_PROVIDER_SITE_OTHER): Payer: BLUE CROSS/BLUE SHIELD | Admitting: Family Medicine

## 2018-10-10 ENCOUNTER — Encounter: Payer: Self-pay | Admitting: Family Medicine

## 2018-10-10 VITALS — Ht 63.0 in | Wt 145.0 lb

## 2018-10-10 DIAGNOSIS — G479 Sleep disorder, unspecified: Secondary | ICD-10-CM

## 2018-10-10 DIAGNOSIS — M62838 Other muscle spasm: Secondary | ICD-10-CM | POA: Diagnosis not present

## 2018-10-10 DIAGNOSIS — F4389 Other reactions to severe stress: Secondary | ICD-10-CM

## 2018-10-10 DIAGNOSIS — M545 Low back pain, unspecified: Secondary | ICD-10-CM

## 2018-10-10 DIAGNOSIS — F438 Other reactions to severe stress: Secondary | ICD-10-CM | POA: Diagnosis not present

## 2018-10-10 MED ORDER — CYCLOBENZAPRINE HCL 10 MG PO TABS: 10.0000 mg | ORAL_TABLET | Freq: Three times a day (TID) | ORAL | 0 refills | Status: DC | PRN

## 2018-10-10 MED ORDER — AMITRIPTYLINE HCL 25 MG PO TABS: 25.0000 mg | ORAL_TABLET | Freq: Every day | ORAL | 0 refills | Status: DC

## 2018-10-10 NOTE — Patient Instructions (Signed)
If you have insomnia or difficulty sleeping, this information is for you:  - Avoid caffeinated beverages after lunch,  no alcoholic beverages,  no eating within 2-3 hours of lying down,  avoid exposure to blue light before bed,  avoid daytime naps, and  needs to maintain a regular sleep schedule- go to sleep and wake up around the same time every night.   - Resolve concerns or worries before entering bedroom:  Discussed relaxation techniques with patient and to keep a journal to write down fears\ worries.  I suggested seeing a counselor for CBT.   - Recommend patient meditate or do deep breathing exercises to help relax.   You can try to incorporate the use of white noise machines or listen to "sleep meditation music", or recordings of "guided meditations for sleep" ( there are several different ones!)  from YouTube which are free, such as  "guided meditation for detachment from over thinking"  by Mayford Knife.         Insomnia Insomnia is a sleep disorder that makes it difficult to fall asleep or stay asleep. Insomnia can cause fatigue, low energy, difficulty concentrating, mood swings, and poor performance at work or school. There are three different ways to classify insomnia:  Difficulty falling asleep.  Difficulty staying asleep.  Waking up too early in the morning. Any type of insomnia can be long-term (chronic) or short-term (acute). Both are common. Short-term insomnia usually lasts for three months or less. Chronic insomnia occurs at least three times a week for longer than three months. What are the causes? Insomnia may be caused by another condition, situation, or substance, such as:  Anxiety.  Certain medicines.  Gastroesophageal reflux disease (GERD) or other gastrointestinal conditions.  Asthma or other breathing conditions.  Restless legs syndrome, sleep apnea, or other sleep disorders.  Chronic pain.  Menopause.  Stroke.  Abuse of alcohol, tobacco, or  illegal drugs.  Mental health conditions, such as depression.  Caffeine.  Neurological disorders, such as Alzheimer's disease.  An overactive thyroid (hyperthyroidism). Sometimes, the cause of insomnia may not be known. What increases the risk? Risk factors for insomnia include:  Gender. Women are affected more often than men.  Age. Insomnia is more common as you get older.  Stress.  Lack of exercise.  Irregular work schedule or working night shifts.  Traveling between different time zones.  Certain medical and mental health conditions. What are the signs or symptoms? If you have insomnia, the main symptom is having trouble falling asleep or having trouble staying asleep. This may lead to other symptoms, such as:  Feeling fatigued or having low energy.  Feeling nervous about going to sleep.  Not feeling rested in the morning.  Having trouble concentrating.  Feeling irritable, anxious, or depressed. How is this diagnosed? This condition may be diagnosed based on:  Your symptoms and medical history. Your health care provider may ask about: ? Your sleep habits. ? Any medical conditions you have. ? Your mental health.  A physical exam. How is this treated? Treatment for insomnia depends on the cause. Treatment may focus on treating an underlying condition that is causing insomnia. Treatment may also include:  Medicines to help you sleep.  Counseling or therapy.  Lifestyle adjustments to help you sleep better. Follow these instructions at home: Eating and drinking   Limit or avoid alcohol, caffeinated beverages, and cigarettes, especially close to bedtime. These can disrupt your sleep.  Do not eat a large meal or  eat spicy foods right before bedtime. This can lead to digestive discomfort that can make it hard for you to sleep. Sleep habits   Keep a sleep diary to help you and your health care provider figure out what could be causing your insomnia. Write  down: ? When you sleep. ? When you wake up during the night. ? How well you sleep. ? How rested you feel the next day. ? Any side effects of medicines you are taking. ? What you eat and drink.  Make your bedroom a dark, comfortable place where it is easy to fall asleep. ? Put up shades or blackout curtains to block light from outside. ? Use a white noise machine to block noise. ? Keep the temperature cool.  Limit screen use before bedtime. This includes: ? Watching TV. ? Using your smartphone, tablet, or computer.  Stick to a routine that includes going to bed and waking up at the same times every day and night. This can help you fall asleep faster. Consider making a quiet activity, such as reading, part of your nighttime routine.  Try to avoid taking naps during the day so that you sleep better at night.  Get out of bed if you are still awake after 15 minutes of trying to sleep. Keep the lights down, but try reading or doing a quiet activity. When you feel sleepy, go back to bed. General instructions  Take over-the-counter and prescription medicines only as told by your health care provider.  Exercise regularly, as told by your health care provider. Avoid exercise starting several hours before bedtime.  Use relaxation techniques to manage stress. Ask your health care provider to suggest some techniques that may work well for you. These may include: ? Breathing exercises. ? Routines to release muscle tension. ? Visualizing peaceful scenes.  Make sure that you drive carefully. Avoid driving if you feel very sleepy.  Keep all follow-up visits as told by your health care provider. This is important. Contact a health care provider if:  You are tired throughout the day.  You have trouble in your daily routine due to sleepiness.  You continue to have sleep problems, or your sleep problems get worse. Get help right away if:  You have serious thoughts about hurting yourself or  someone else. If you ever feel like you may hurt yourself or others, or have thoughts about taking your own life, get help right away. You can go to your nearest emergency department or call:  Your local emergency services (911 in the [place]  A suicide crisis helpline, such as the [facility] at 9287943856. This is open 24 hours a day. Summary  Insomnia is a sleep disorder that makes it difficult to fall asleep or stay asleep.  Insomnia can be long-term (chronic) or short-term (acute).  Treatment for insomnia depends on the cause. Treatment may focus on treating an underlying condition that is causing insomnia.  Keep a sleep diary to help you and your health care provider figure out what could be causing your insomnia. This information is not intended to replace advice given to you by your health care provider. Make sure you discuss any questions you have with your health care provider. Document Released: 06/10/2000 Document Revised: 03/23/2017 Document Reviewed: 03/23/2017 Elsevier Interactive Patient Education  2019 [employer].

## 2018-10-10 NOTE — Progress Notes (Signed)
Virtual Visit via Telephone Note for [employer], D.O- Primary Care Physician at [facility]   I connected with current patient today by telephone and verified that I am speaking with the correct person using two identifiers.   Because of federal recommendations of social distancing due to the current novel COVID-19 outbreak, an audio/video telehealth visit is felt to be most appropriate for this patient at this time.  My staff members also discussed with the patient that there may be a patient charge related to this service.   The patient expressed understanding, and agreed to proceed.    History of Present Illness:   Past weekend- was working around her permanent campsite- lifted some stuff but nothing heavy.   She woke up the next day and felt soreness in lower back.   Tried advil / alleve and heating- no improvement.    Was icing as well.   By Beatris Ship- a lot of m. spasms.   She went to the chiropractor  - told her minimal skeletal issues but adjusted her - but told her bad muscle spasms.   Pt not sleeping well b/c of the pain she experiences.   Pt had to drive longer distance yesterday---> now woke up and worse today.   No radiation.   - pt has prescription advil already.  -chiro - dr Donovan Kail:  Pt goes "regularly for adjustments"- mostly neck, hip etc   Insomnia- would like to restart elavil.  Mom uses it and works well.  Patient was on it upon review of the chart sometime between March and May 2019.  In September 2019 per patient, she was drinking alcohol on it and 1 tablet could no longer work so she went to 2 tablets which made her feel drunk and groggy hung over the next day hence, she wanted to change the medicine.  Patient recalls this history and I reviewed this with her but she still would like to retry this medicine now that she has quit drinking every night    Patient also told me about her chronic hip pain and that she "needs a new hip "which is what her chiropractor  told and some other doctor.    She knows that her chronic hip pain is contributing to the lower back.   -  Because of all of these pains she has not been able to exercise as she wants.  She has been feeling more anxious and unsettled more so than normal.  She tells me she thinks this is contributing to her sleep disorder.  She does appear to be her usual hyper self on the phone today.  Wt Readings from Last 3 Encounters:  10/10/18 145 lb (65.8 kg)  07/09/18 150 lb (68 kg)  04/24/18 141 lb (64 kg)    BP Readings from Last 3 Encounters:  08/31/18 117/78  08/27/18 109/75  07/09/18 111/77    Pulse Readings from Last 3 Encounters:  08/31/18 81  08/27/18 90  07/09/18 81    BMI Readings from Last 3 Encounters:  10/10/18 25.69 kg/m  07/09/18 26.57 kg/m  04/24/18 24.98 kg/m      -Vitals obtained; Medications, allergies reconciled;  personal medical, social, Sx etc. etc. histories were updated by Lanier Prude the medical assistant today and are reflected in below chart   Patient Care Team    Relationship Specialty Notifications Start End  [name], DO PCP - General Family Medicine  12/10/15   Brien Few, MD  Consulting Physician Obstetrics and Gynecology  05/16/17   Merrilee Seashore, MD Consulting Physician Internal Medicine  05/16/17    Comment: Actually sees Dr. Felicie Morn at [facility], [facility]    07/24/18      Patient Active Problem List   Diagnosis Date Noted  . Acute reaction to situational stress 05/16/2017    Priority: High  . GAD (generalized anxiety disorder) 05/16/2017    Priority: High  . Stress-related physiological response affecting physical condition 05/16/2017    Priority: Medium  . GERD (gastroesophageal reflux disease) 12/16/2015    Priority: Medium  . Tinea corporis (h/o tinea versicolor per pt) 12/20/2015    Priority: Low  . Cough 07/09/2018  . Hyperprolactinemia ([place]) 03/06/2018  . Pain in joint of right hip  12/25/2017  . Environmental and seasonal allergies 11/15/2017  . Chronic pansinusitis 11/15/2017  . Cervical disc disorder 11/15/2017  . Muscle spasm- neck muscles 11/15/2017  . H/O: pituitary tumor 09/06/2017  . Sleeping difficulty 09/06/2017  . Subclinical hypothyroidism 09/06/2017  . Generalized osteoarthritis of multiple sites 09/06/2017  . Numbness and tingling in both arms/ hands 05/16/2017  . Non-cardiac chest pain- assoc with emotional stress 05/16/2017  . Palpitations 01/18/2016  . Family history of stroke (cerebrovascular)- mother 4's 01/18/2016  . Family history of atrial fibrillation 01/18/2016  . Seborrheic dermatitis of scalp 12/16/2015     Current Meds  Medication Sig  . albuterol (PROVENTIL HFA;VENTOLIN HFA) 108 (90 Base) MCG/ACT inhaler Inhale 1-2 puffs into the lungs every 6 (six) hours as needed for wheezing or shortness of breath.  . busPIRone (BUSPAR) 15 MG tablet Take 1 tablet (15 mg total) by mouth 2 (two) times daily. (Patient taking differently: Take 15 mg by mouth 2 (two) times daily. Patient is not taking regularly.)  . fluticasone (FLONASE) 50 MCG/ACT nasal spray Place 2 sprays into both nostrils daily.  Marland Kitchen ibuprofen (ADVIL,MOTRIN) 600 MG tablet Take 1 tablet (600 mg total) by mouth every 6 (six) hours as needed.  Javier Docker Oil 500 MG CAPS Take 500 mg by mouth daily.  . sertraline (ZOLOFT) 50 MG tablet Take 2 tablets (100 mg total) by mouth daily.     Allergies:  Allergies  Allergen Reactions  . Latex Swelling  . Latex Rash     ROS:  See above HPI for pertinent positives and negatives   Objective:   Height 5\' 3"  (1.6 m), weight 145 lb (65.8 kg), last menstrual period 09/19/2018. (if some vitals are omitted, this means that patient was UNABLE to obtain them even though asked to get them prior to [place] today) General: sounds in no acute distress.  Skin: Pt confirms warm and dry  extremities and pink fingertips Respiratory: speaking in full sentences,  no conversational dyspnea Psych: A and O *3, appears insight good, mood- full      Impression and Recommendations:      ICD-10-CM   1. Acute bilateral low back pain without sciatica M54.5   2. Muscle spasm M62.838   3. Sleeping difficulty G47.9 amitriptyline (ELAVIL) 25 MG tablet  4. Stress-related physiological response affecting physical condition F43.8 amitriptyline (ELAVIL) 25 MG tablet    -Flexeril -Elavil as needed sleep -Discussed and counseled patient on healthy habits for stress relief, insomnia, muscle spasm relief etc. Etc.   Many issues were addressed and discussed today.  -After discussion we decided it would be best for her to follow-up within 7 to 10 days so we can see how the Flexeril and  back is doing as well as assess how the Elavil is doing for her sleep.  As part of my medical decision making, I reviewed the following data within the [facility] History obtained from pt/family, CMA notes reviewed and incorporated, Labs reviewed, Radiograph/ tests reviewed if applicable and OV notes from prior OV's with me, as well as other specialists he has seen since seeing me last, were all reviewed and used in my medical decision making process today. Additionally, discussion had with patient regarding txmnt plan, their biases about that plan etc were used in my medical decision making today.  I discussed the assessment and treatment plan with the patient. The patient was provided an opportunity to ask questions and all were answered.  The patient agreed with the plan and demonstrated an understanding of the instructions.   No barriers to understanding were identified.  Red flag symptoms and signs discussed in detail.  Patient expressed understanding regarding what to do in case of emergency\urgent symptoms   The patient was advised to call back or seek an in-person evaluation if the symptoms worsen or if the condition fails to improve as anticipated.   Return for  7-10 d started elavil again for sleep, reck back pain/ muscle spasms.     Meds ordered this encounter  Medications  . cyclobenzaprine (FLEXERIL) 10 MG tablet    Sig: Take 1 tablet (10 mg total) by mouth 3 (three) times daily as needed for muscle spasms.    Dispense:  30 tablet    Refill:  0  . amitriptyline (ELAVIL) 25 MG tablet    Sig: Take 1 tablet (25 mg total) by mouth at bedtime.    Dispense:  90 tablet    Refill:  0    **Gross side effects, risk and benefits, and alternatives of medications and treatment plan in general discussed with patient.  Patient is aware that all medications have potential side effects and we are unable to predict every side effect or drug-drug interaction that may occur.   Patient was strongly encouraged to call with any questions or concerns they may have concerns.     I provided 22+ minutes of non-face-to-face time during this encounter.   [name], DO

## 2018-10-10 NOTE — Telephone Encounter (Signed)
Called and notified patient.  [facility] appt made for today at 3pm. MPulliam, CMA/RT(R)

## 2018-10-24 ENCOUNTER — Ambulatory Visit (INDEPENDENT_AMBULATORY_CARE_PROVIDER_SITE_OTHER): Payer: BLUE CROSS/BLUE SHIELD | Admitting: Family Medicine

## 2018-10-24 ENCOUNTER — Encounter: Payer: Self-pay | Admitting: Family Medicine

## 2018-10-24 ENCOUNTER — Other Ambulatory Visit: Payer: Self-pay

## 2018-10-24 VITALS — Ht 63.0 in | Wt 140.0 lb

## 2018-10-24 DIAGNOSIS — F438 Other reactions to severe stress: Secondary | ICD-10-CM | POA: Diagnosis not present

## 2018-10-24 DIAGNOSIS — M62838 Other muscle spasm: Secondary | ICD-10-CM

## 2018-10-24 DIAGNOSIS — F411 Generalized anxiety disorder: Secondary | ICD-10-CM

## 2018-10-24 DIAGNOSIS — F4389 Other reactions to severe stress: Secondary | ICD-10-CM

## 2018-10-24 DIAGNOSIS — G479 Sleep disorder, unspecified: Secondary | ICD-10-CM | POA: Diagnosis not present

## 2018-10-24 DIAGNOSIS — J3089 Other allergic rhinitis: Secondary | ICD-10-CM

## 2018-10-24 MED ORDER — FLUTICASONE PROPIONATE 50 MCG/ACT NA SUSP: NASAL | 2 refills | Status: DC

## 2018-10-24 NOTE — Progress Notes (Signed)
Telehealth office visit note for Connie Cole, D.O- at Primary Care at [facility]   I connected with current patient today and verified that I am speaking with the correct person using two identifiers.   . Location of the patient: Home . Location of the provider: Office Only the patient (+/- their family members at pt's discretion) and myself were participating in the encounter    - This visit type was conducted due to national recommendations for restrictions regarding the COVID-19 Pandemic (e.g. social distancing) in an effort to limit this patient's exposure and mitigate transmission in our community.  This format is felt to be most appropriate for this patient at this time.   - The patient did not have access to video technology or had technical difficulties with video requiring transitioning to audio format only. - No physical exam could be performed with this format, beyond that communicated to [place] by the patient/ family members as noted.   - Additionally my office staff/ schedulers discussed with the patient that there may be a monetary charge related to this service, depending on their medical insurance.   The patient expressed understanding, and agreed to proceed.       History of Present Illness:  INsomnia:   Restarted elavil last OV.   Pt is taking it.  No s-e.  Her Mom is taking elavil also but told her it took her 21 days for it start working.    Patient wishes to continue it.   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  She is having some shakes in her extremities like she had in the past when I first met her.  She also has a history of heart palpitations with excess caffeine etc.  She had even seen cardiology for this-work-up was negative.    In the remote past she also had some nonspecific neurological symptoms of  numbness /tingling etc. throughout her body/ extremities.  This was approximately 1 year ago and she even went to the emergency room about it-which I reviewed today.   She is having similar sx- she states her symptoms are nowhere like that today as they are much milder.     She also had a negative work-up for this.   At the time, once neurological and cardiovascular etiologies were ruled out and B12 deficiency, thyroid etc. was ruled out,  we decided this was anxiety and mood related.    She admits she is out of her normal routine with covid-19 crisis and hence, has had more stress than usual.    She currently is having similar symptoms.  She has been stressed- tells me she is taking her Zoloft 100 mg daily.  She is not sleeping very well and that is why we gave her the Elavil.  She also states she has upper muscle tightness and spasms which do always make her extremities feel numb and tingly-she sees her chiropractor regularly for this.     Impression and Recommendations:    1. GAD (generalized anxiety disorder) Continue Zoloft. -We discussed stress management techniques such as meditation, deep breathing etc.  She asked her my handout and she will come into the office and get it later on today.  2. Stress-related physiological response affecting physical condition I reviewed all her old notes of my office visit notes  with her as well as her ED office visit notes.  She is surprised how completely similar her concerns were then as now. -She finds this reassuring and states that she just knows her body well enough to notice these things every spring. -Reassurance provided.  Red flag symptoms discussed and if she develops any she will let us know.  3. Muscle spasm- upper back/ neck Discussed ice cup massage followed by heat.  Increase activity to increase blood flow to the area and stress management techniques were recommended.  4. Sleeping difficulty Patient wanted information about the bluelight  blocking glasses and what meditation to do in addition to her Elavil. -She will come get my handout later on today.  I asked Melissa to please printed and handed to patient.  5. Environmental and seasonal allergies -Supportive care and over-the-counter medicines discussed with patient. - fluticasone (FLONASE) 50 MCG/ACT nasal spray; 1 spray each nostril following sinus rinses twice daily  Dispense: 16 g; Refill: 2  - As part of my medical decision making, I reviewed the following data within the [place] History obtained from pt /family, CMA notes reviewed and incorporated if applicable, Labs reviewed, Radiograph/ tests reviewed if applicable and OV notes from prior OV's with me, as well as other specialists she/he has seen since seeing me last, were all reviewed and used in my medical decision making process today.   - Additionally, discussion had with patient regarding txmnt plan, and their biases/concerns about that plan were used in my medical decision making today.   - The patient agreed with the plan and demonstrated an understanding of the instructions.   No barriers to understanding were identified.   - Red flag symptoms and signs discussed in detail.  Patient expressed understanding regarding what to do in case of emergency\ urgent symptoms.  The patient was advised to call back or seek an in-person evaluation if the symptoms worsen or if the condition fails to improve as anticipated.   - Novel Covid -19 counseling done; all questions were answered.   - Current CDC and federal guidelines reviewed with patient  - Reminded pt of extreme importance of social distancing; minimizing contacts with others, avoiding ALL but emergency appts etc. - Told patient to be prepared, not scared; and be smart for the sake of others - told to call with any concerns   Return for 4-6wks f/up sleep, stop caffeine, better stress mgt tech, etc.  -Patient is aware that she needs complete set  of fasting blood work however, we will have a chronic follow-up in the future to see how her sleep, stress and very symptoms are doing and then schedule CPE with fasting blood work after that.   Meds ordered this encounter  Medications  . fluticasone (FLONASE) 50 MCG/ACT nasal spray    Sig: 1 spray each nostril following sinus rinses twice daily    Dispense:  16 g    Refill:  2    There are no discontinued medications.    I provided 21+ minutes of non-face-to-face time during this encounter,with over 50% of the time in direct counseling on patients medical conditions/ medical concerns.  Additional time was spent with charting and coordination of care after the actual visit commenced.   Note:  This note was prepared with assistance of Dragon voice recognition software. Occasional wrong-word or sound-a-like substitutions may have occurred due to the inherent limitations of voice recognition software.  Connie Dance, DO    -Vitals obtained; medications/  allergies reconciled;  personal medical, social, Sx etc.histories were updated by CMA, reviewed by me and are reflected in chart   Patient Care Team    Relationship Specialty Notifications Start End  Connie Dance, DO PCP - General Family Medicine  12/10/15   Brien Few, MD Consulting Physician Obstetrics and Gynecology  05/16/17   Merrilee Seashore, MD Consulting Physician Internal Medicine  05/16/17    Comment: Actually sees Dr. Felicie Morn at [facility], [facility]    07/24/18      Patient Active Problem List   Diagnosis Date Noted  . Acute reaction to situational stress 05/16/2017    Priority: High  . GAD (generalized anxiety disorder) 05/16/2017    Priority: High  . Stress-related physiological response affecting physical condition 05/16/2017    Priority: Medium  . GERD (gastroesophageal reflux disease) 12/16/2015    Priority: Medium  . Tinea corporis (h/o tinea versicolor per pt) 12/20/2015     Priority: Low  . Cough 07/09/2018  . Hyperprolactinemia ([place]) 03/06/2018  . Pain in joint of right hip 12/25/2017  . Environmental and seasonal allergies 11/15/2017  . Chronic pansinusitis 11/15/2017  . Cervical disc disorder 11/15/2017  . Muscle spasm- neck muscles 11/15/2017  . H/O: pituitary tumor 09/06/2017  . Sleeping difficulty 09/06/2017  . Subclinical hypothyroidism 09/06/2017  . Generalized osteoarthritis of multiple sites 09/06/2017  . Numbness and tingling in both arms/ hands 05/16/2017  . Non-cardiac chest pain- assoc with emotional stress 05/16/2017  . Palpitations 01/18/2016  . Family history of stroke (cerebrovascular)- mother 4's 01/18/2016  . Family history of atrial fibrillation 01/18/2016  . Seborrheic dermatitis of scalp 12/16/2015     Current Meds  Medication Sig  . albuterol (PROVENTIL HFA;VENTOLIN HFA) 108 (90 Base) MCG/ACT inhaler Inhale 1-2 puffs into the lungs every 6 (six) hours as needed for wheezing or shortness of breath.  Marland Kitchen amitriptyline (ELAVIL) 25 MG tablet Take 1 tablet (25 mg total) by mouth at bedtime.  . cyclobenzaprine (FLEXERIL) 10 MG tablet Take 1 tablet (10 mg total) by mouth 3 (three) times daily as needed for muscle spasms.  . fluticasone (FLONASE) 50 MCG/ACT nasal spray Place 2 sprays into both nostrils daily.  Marland Kitchen ibuprofen (ADVIL,MOTRIN) 600 MG tablet Take 1 tablet (600 mg total) by mouth every 6 (six) hours as needed.  Javier Docker Oil 500 MG CAPS Take 500 mg by mouth daily.  . sertraline (ZOLOFT) 50 MG tablet Take 2 tablets (100 mg total) by mouth daily.     Allergies:  Allergies  Allergen Reactions  . Latex Swelling  . Latex Rash     ROS:  See above HPI for pertinent positives and negatives   Objective:   Height _0  (1.6 m), weight 140 lb (63.5 kg), last menstrual period 10/17/2018.  (if some vitals are omitted, this means that patient was UNABLE to obtain them even though they were asked to get them prior to [place] today.  They  were asked to call us at their earliest convenience with these once obtained. )  General: A & O * 3; sounds in no acute distress; in usual state of health.  Skin: Pt confirms warm and dry extremities and pink fingertips HEENT: Pt confirms lips non-cyanotic Chest: Patient confirms normal chest excursion and movement Respiratory: speaking in full sentences, no conversational dyspnea; patient confirms no use of accessory muscles Psych: insight appears good, mood- appears full

## 2018-10-24 NOTE — Patient Instructions (Addendum)
FDA blue blocking glasses - blocking the light of wavelength of 300-450nm   I want you to do Cooks Hook-up and square breathing 4-5 times per day.  10 square breathing each time.    -Also we need to transition your brain into thinking more positively.  These tasks below are some things I want you to do every day 1)  write 3 new things that you are grateful for every day for 21 days  2)  exercise daily- walk for 15 minutes twice a day every day 3)  you are going to journal every day about one positive experience that you had 4)  meditate every day.  You can go on YouTube and look for 15-minute relaxation meditation or what ever.  But we need to make sure that you are in the moment and relaxing and deep breathing every day 5)  Write 1 positive email every day to praise someone in your life     - If you have insomnia or difficulty sleeping, this information is for you:  - Avoid caffeinated beverages after lunch,  no alcoholic beverages,  no eating within 2-3 hours of lying down,  avoid exposure to blue light before bed,  avoid daytime naps, and  needs to maintain a regular sleep schedule- go to sleep and wake up around the same time every night.   - Resolve concerns or worries before entering bedroom:  Discussed relaxation techniques with patient and to keep a journal to write down fears\ worries.  I suggested seeing a counselor for CBT.   - Recommend patient meditate or do deep breathing exercises to help relax.   Incorporate the use of white noise machines or listen to "sleep meditation music", or recordings of guided meditations for sleep from YouTube which are free, such as  "guided meditation for detachment from over thinking"  by Mayford Knife.         What is Chronic Stress Syndrome, Symptoms & Ways to Deal With it   What is Chronic Stress Syndrome?  Chronic Stress Syndrome is something which can now be called as a medical condition due to the amount of stress an  individual is going through these days. Chronic Stress Syndrome causes the body and mind to shutdown and the person has no control over himself or herself. Due to the demands of modern day life and the hardship throughout day and night takes its toll over a period of time and the body and brain starts demanding rest and a break. This leads to certain symptoms where your performance level starts to dip at work, you become irritable both at work and at home, you may stop enjoying activities you previously liked, you may become depressed, you may get angry for even small things. Chronic Stress Syndrome can significantly impact your quality life. Thus it is important understand the symptoms of Chronic Stress Syndrome and react accordingly in order to cope up with it.  It is important to note here that a balanced work-home equation should be drawn to cut down symptoms of Chronic Stress Syndrome. Minor stressors can be overcome by the body's inbuilt stress response but when there is unending stress for a long period of time then an external help is required to ease the stress.  Chronic Stress Syndrome can physically and psychologically drain you over a period of time. For such cases stress management is the best way to cope up with Chronic Stress Syndrome. If Chronic Stress Syndrome is not treated then it  may result in many health hazards like anxiety, muscle pain, insomnia, and high blood pressure along with a compromised immune system leading to frequent infections and missed days from work.    What are the Symptoms of Chronic Stress Syndrome?   The symptoms of Chronic Stress Syndrome are variable and range from generalized symptoms to emotional symptoms along with behavioral and cognitive symptoms. Some of these symptoms have been delineated below:  Generalized Symptoms of Chronic Stress Syndrome are: Anxiety Depression Social isolation Headache Abdominal pain Lack of sleep Back pain Difficulty in  concentrating Hypertension Hemorrhoids Varicose veins Panic attacks/ Panic disorder Cardiovascular diseases.   Some of the Emotional Symptoms of Chronic Stress Syndrome are: To become easily agitated, moody and frustrated Feeling overwhelmed which makes you feel like you are losing control. Having difficulty relaxing and have a peaceful mind Having low self esteem Feeling lonely Feeling worthless Feeling depressed Avoiding social environment.   Some of the Physical Symptoms of Chronic Stress Syndrome are: Headaches Lethargy Alternating diarrhea and constipation Nausea Muscles aches and pains Insomnia Rapid heartbeat and chest pain Infections and frequent colds Decreased libido Nervousness and shaking Tinnitus Sweaty palms Dry mouth Clenched jaw.  Some of the Cognitive Symptoms of Chronic Stress Syndrome are: Constant worrying Racing thoughts Disorganization and forgetfulness Inability to focus Poor judgment Abundance of negativity.  Some of the Behavioral Symptoms of Chronic Stress Syndrome are: Changes in appetite with less desire to eat Avoiding responsibilities Indulgence in alcohol or recreational drug use Increased nail biting and being fidgety Ways to Deal With Chronic Stress Syndrome    Chronic Stress Syndrome is not something which cannot be addressed. A bit of effort from your side in the form of lifestyle modifications, a little bit of exercise, a balanced work life equation can do wonders and help you get rid of Chronic Stress Syndrome.  Get Proper Sleep: It has been proved that Chronic Stress Syndrome causes loss of sleep where an individual may not even be able to sleep for days unending. This may result in the individual feeling lethargic and unable to focus at work the following morning. This may lead to decreased performance at work. Thus, it is important to have a good sleep-wake cycle. For this, try and not drink any caffeinated beverage about  four hours prior to going to sleep, as caffeine pumps up the adrenaline and causes you to stay awake resulting ultimately in Chronic Stress Syndrome.  Avoid Alcohol and Drugs: Another way to get rid of Chronic Stress Syndrome is lifestyle modifications. Stay away from alcohol and other recreational drugs. Take Short Frequent Breaks at Work: Try to take frequent breaks from work and do not work continuously. Try and manage your work in such a way that you even meet your deadline and come home on time for a happy dinner with family. A good time spent with family and kids does wonders in not only dealing with Chronic Stress Syndrome but also preventing it.  Become Physically Active: Another step towards getting rid of Chronic Stress Syndrome is physical activity. If you do not have time to spend at the gym then at least try and go for daily walks for about half an hour a day which not only keeps the stress away but also is good for your overall health. Physical activity leads to production of endorphins which will make you feel relaxed and feel good.  Healthy Diet Can Help You Deal With Chronic Stress Syndrome: Have a balanced and healthy diet  is another step towards a stress free life and keeping Chronic Stress Syndrome at [place]. If time is a constraint then you can try eating three small meals a day. Try and avoid fast foods and take foods which are healthy and rich in proteins, fiber, and carbohydrates to boost your energy system.  Music Can Soothe Your Mind: Light music is one of the best and most effective relaxation techniques that one can try to overcome stress. It has shown to calm down the mind and take you away from all the stressors that you may be having. These days it is also being used as a therapy in some institutes for overcoming stress. It is important here to discuss the importance of a good social support system for patients with Chronic Stress Syndrome, as a good social support framework can  do wonders in taking the stress away from the patient and overcoming Chronic Stress Syndrome.  Meditation Can Help You Deal With Chronic Stress Syndrome Effectively: Meditation and yoga has also shown to be quite effective in relaxing the mind and coping up with Chronic Stress Syndrome   In cases where these measures are not helpful, then it is time for you to consult with a skilled psychologist or a psychiatrist for potential therapies or medications to control the stress response.   The psychologist can help you with a variety of steps for coping up with Chronic Stress Syndrome. Relaxation techniques and behavioral therapy are some of the methods employed by psychologists. In some cases, medications can also be given to help relax the patient.  Since Chronic Stress Syndrome is both emotionally and physically draining for the patient and it also adversely affects the family life of the patient hence it is important for the patient to recognize the condition and taking steps to cope up with it. Escaping measures like alcohol and drug use are of no help as they only aggravate the condition apart from their other health hazards. If this condition is ignored or left untreated it can lead to various medical conditions like anxiety and depression and various other medical conditions.  Last but not least, smile as often as you can as it is the best gift that you can give to someone. The best way to stay relaxed is to have a good smile, exercise daily, spend time with your family, meditation and if required consultation with a good psychologist so that you can live a stress free life and overcome the symptoms of Chronic Stress Syndrome.

## 2018-11-26 ENCOUNTER — Other Ambulatory Visit: Payer: Self-pay

## 2018-11-26 ENCOUNTER — Telehealth: Payer: Self-pay | Admitting: Family Medicine

## 2018-11-26 DIAGNOSIS — F411 Generalized anxiety disorder: Secondary | ICD-10-CM

## 2018-11-26 MED ORDER — SERTRALINE HCL 50 MG PO TABS: 100.0000 mg | ORAL_TABLET | Freq: Every day | ORAL | 0 refills | Status: DC

## 2018-11-26 NOTE — Telephone Encounter (Signed)
Patient is requesting a refill of her Zoloft, if approved please send to [facility] on [address]

## 2018-11-27 ENCOUNTER — Other Ambulatory Visit: Payer: Self-pay

## 2018-11-27 DIAGNOSIS — F411 Generalized anxiety disorder: Secondary | ICD-10-CM

## 2018-11-27 MED ORDER — SERTRALINE HCL 50 MG PO TABS: 100.0000 mg | ORAL_TABLET | Freq: Every day | ORAL | 0 refills | Status: DC

## 2018-11-27 NOTE — Progress Notes (Signed)
Sent to [place] due to patient not able to pick up at [facility] due to closure of the store. MPulliam, CMA/RT(R)

## 2018-11-30 DIAGNOSIS — M25551 Pain in right hip: Secondary | ICD-10-CM | POA: Diagnosis not present

## 2018-11-30 DIAGNOSIS — M1611 Unilateral primary osteoarthritis, right hip: Secondary | ICD-10-CM | POA: Diagnosis not present

## 2018-12-19 ENCOUNTER — Telehealth: Payer: Self-pay | Admitting: Family Medicine

## 2018-12-19 NOTE — Telephone Encounter (Signed)
Forwarding FYI to medical assistant that attempt to contact pt for Pre- Auth appt / left msg for Pt to call office to set up.  --glh

## 2018-12-20 NOTE — H&P (Signed)
TOTAL HIP ADMISSION H&P  Patient is admitted for right total hip arthroplasty, anterior approach.  Subjective:  Chief Complaint:    Right hip primary OA / pain  HPI: Connie Cole, 48 y.o. female, has a history of pain and functional disability in the right hip(s) due to arthritis and patient has failed non-surgical conservative treatments for greater than 12 weeks to include NSAID's and/or analgesics, corticosteriod injections, activity modification and bone marrow injection.  Onset of symptoms was gradual starting 8-10 years ago with gradually worsening course since that time.The patient noted no past surgery on the right hip(s).  Patient currently rates pain in the right hip at 10 out of 10 with activity. Patient has night pain, worsening of pain with activity and weight bearing, trendelenberg gait, pain that interfers with activities of daily living and pain with passive range of motion. Patient has evidence of periarticular osteophytes and joint space narrowing by imaging studies. This condition presents safety issues increasing the risk of falls.  There is no current active infection.  Risks, benefits and expectations were discussed with the patient.  Risks including but not limited to the risk of anesthesia, blood clots, nerve damage, blood vessel damage, failure of the prosthesis, infection and up to and including death.  Patient understand the risks, benefits and expectations and wishes to proceed with surgery.   PCP: [name], DO  D/C Plans:       Home   Post-op Meds:       No Rx given  Tranexamic Acid:      To be given - IV   Decadron:      Is to be given  FYI:      ASA  Norco  DME:   Rx given for - RW & 3-n-1  PT:   HEP    Patient Active Problem List   Diagnosis Date Noted  . Cough 07/09/2018  . Hyperprolactinemia ([place]) 03/06/2018  . Pain in joint of right hip 12/25/2017  . Environmental and seasonal allergies 11/15/2017  . Chronic pansinusitis 11/15/2017  .  Cervical disc disorder 11/15/2017  . Muscle spasm- neck muscles 11/15/2017  . H/O: pituitary tumor 09/06/2017  . Sleeping difficulty 09/06/2017  . Subclinical hypothyroidism 09/06/2017  . Generalized osteoarthritis of multiple sites 09/06/2017  . Acute reaction to situational stress 05/16/2017  . GAD (generalized anxiety disorder) 05/16/2017  . Stress-related physiological response affecting physical condition 05/16/2017  . Numbness and tingling in both arms/ hands 05/16/2017  . Non-cardiac chest pain- assoc with emotional stress 05/16/2017  . Palpitations 01/18/2016  . Family history of stroke (cerebrovascular)- mother 66's 01/18/2016  . Family history of atrial fibrillation 01/18/2016  . Tinea corporis (h/o tinea versicolor per pt) 12/20/2015  . Seborrheic dermatitis of scalp 12/16/2015  . GERD (gastroesophageal reflux disease) 12/16/2015   Past Medical History:  Diagnosis Date  . Arthritis    hands, hips  . GERD (gastroesophageal reflux disease)    occasional diet controlled- no med  . Palpitations    wore heart monitor x 1 wk, unknown - stress related    Past Surgical History:  Procedure Laterality Date  . COLONOSCOPY     polyp  . DILITATION & CURRETTAGE/HYSTROSCOPY WITH HYDROTHERMAL ABLATION N/A 06/03/2016   Procedure: DILATATION & CURETTAGE/HYSTEROSCOPY WITH HYDROTHERMAL ABLATION;  Surgeon: Brien Few, MD;  Location: [facility];  Service: Gynecology;  Laterality: N/A;  . IR RADIOLOGIST EVAL & MGMT  09/06/2016  . TONSILLECTOMY      No current facility-administered medications for  this encounter.    Current Outpatient Medications  Medication Sig Dispense Refill Last Dose  . albuterol (PROVENTIL HFA;VENTOLIN HFA) 108 (90 Base) MCG/ACT inhaler Inhale 1-2 puffs into the lungs every 6 (six) hours as needed for wheezing or shortness of breath. 1 Inhaler 0 Taking  . amitriptyline (ELAVIL) 25 MG tablet Take 1 tablet (25 mg total) by mouth at bedtime. 90 tablet 0 Taking  .  busPIRone (BUSPAR) 15 MG tablet Take 1 tablet (15 mg total) by mouth 2 (two) times daily. (Patient not taking: Reported on 10/24/2018) 180 tablet 0 Not Taking  . cyclobenzaprine (FLEXERIL) 10 MG tablet Take 1 tablet (10 mg total) by mouth 3 (three) times daily as needed for muscle spasms. 30 tablet 0 Taking  . fluticasone (FLONASE) 50 MCG/ACT nasal spray Place 2 sprays into both nostrils daily. 16 g 0 Taking  . fluticasone (FLONASE) 50 MCG/ACT nasal spray 1 spray each nostril following sinus rinses twice daily 16 g 2   . ibuprofen (ADVIL,MOTRIN) 600 MG tablet Take 1 tablet (600 mg total) by mouth every 6 (six) hours as needed. 30 tablet 0 Taking  . Krill Oil 500 MG CAPS Take 500 mg by mouth daily.   Taking  . sertraline (ZOLOFT) 50 MG tablet Take 2 tablets (100 mg total) by mouth daily. 180 tablet 0    Allergies  Allergen Reactions  . Latex Swelling  . Latex Rash    Social History   Tobacco Use  . Smoking status: Former Smoker    Packs/day: 0.50    Years: 5.00    Pack years: 2.50    Types: Cigarettes    Quit date: 06/27/2000    Years since quitting: 18.4  . Smokeless tobacco: Never Used  Substance Use Topics  . Alcohol use: Yes    Alcohol/week: 2.0 standard drinks    Types: 2 Shots of liquor per week    Family History  Problem Relation Age of Onset  . Stroke Mother   . Heart disease Mother   . Hypertension Mother   . Cancer Father        eye  . Healthy Sister      Review of Systems  Constitutional: Negative.   HENT: Negative.   Eyes: Negative.   Respiratory: Negative.   Cardiovascular: Negative.   Gastrointestinal: Positive for constipation and heartburn.  Genitourinary: Negative.   Musculoskeletal: Positive for joint pain.  Skin: Negative.   Neurological: Negative.   Endo/Heme/Allergies: Negative.   Psychiatric/Behavioral: The patient is nervous/anxious.     Objective:  Physical Exam  Constitutional: She is oriented to person, place, and time. She appears  well-developed.  HENT:  Head: Normocephalic.  Eyes: Pupils are equal, round, and reactive to light.  Neck: Neck supple. No JVD present. No tracheal deviation present. No thyromegaly present.  Cardiovascular: Normal rate, regular rhythm and intact distal pulses.  Respiratory: Effort normal and breath sounds normal. No respiratory distress. She has no wheezes.  GI: Soft. There is no abdominal tenderness. There is no guarding.  Musculoskeletal:     Right hip: She exhibits decreased range of motion, decreased strength, tenderness and bony tenderness. She exhibits no swelling, no deformity and no laceration.  Lymphadenopathy:    She has no cervical adenopathy.  Neurological: She is alert and oriented to person, place, and time.  Skin: Skin is warm and dry.  Psychiatric: She has a normal mood and affect.      Labs:  Estimated body mass index is 24.8 kg/m as  calculated from the following:   Height as of 10/24/18: 5\' 3"  (1.6 m).   Weight as of 10/24/18: 63.5 kg.   Imaging Review Plain radiographs demonstrate severe degenerative joint disease of the right hip. The bone quality appears to be good for age and reported activity level.      Assessment/Plan:  End stage arthritis, right hip  The patient history, physical examination, clinical judgement of the provider and imaging studies are consistent with end stage degenerative joint disease of the right hip and total hip arthroplasty is deemed medically necessary. The treatment options including medical management, injection therapy, arthroscopy and arthroplasty were discussed at length. The risks and benefits of total hip arthroplasty were presented and reviewed. The risks due to aseptic loosening, infection, stiffness, dislocation/subluxation,  thromboembolic complications and other imponderables were discussed.  The patient acknowledged the explanation, agreed to proceed with the plan and consent was signed. Patient is being admitted for  inpatient treatment for surgery, pain control, PT, OT, prophylactic antibiotics, VTE prophylaxis, progressive ambulation and ADL's and discharge planning.The patient is planning to be discharged home.    [place] Milah Recht   PA-C  12/20/2018, 4:55 PM

## 2018-12-27 DIAGNOSIS — Z1211 Encounter for screening for malignant neoplasm of colon: Secondary | ICD-10-CM | POA: Diagnosis not present

## 2018-12-27 DIAGNOSIS — K5904 Chronic idiopathic constipation: Secondary | ICD-10-CM | POA: Diagnosis not present

## 2018-12-27 DIAGNOSIS — R635 Abnormal weight gain: Secondary | ICD-10-CM | POA: Diagnosis not present

## 2018-12-27 DIAGNOSIS — Z86010 Personal history of colonic polyps: Secondary | ICD-10-CM | POA: Diagnosis not present

## 2018-12-31 DIAGNOSIS — Z1211 Encounter for screening for malignant neoplasm of colon: Secondary | ICD-10-CM | POA: Diagnosis not present

## 2019-01-07 ENCOUNTER — Other Ambulatory Visit (HOSPITAL_COMMUNITY)
Admission: RE | Admit: 2019-01-07 | Discharge: 2019-01-07 | Disposition: A | Discharge status: Home / Self Care | Payer: BLUE CROSS/BLUE SHIELD | Source: Ambulatory Visit | Attending: Orthopedic Surgery | Admitting: Orthopedic Surgery

## 2019-01-07 DIAGNOSIS — Z1159 Encounter for screening for other viral diseases: Secondary | ICD-10-CM | POA: Insufficient documentation

## 2019-01-08 ENCOUNTER — Encounter (HOSPITAL_COMMUNITY): Payer: Self-pay

## 2019-01-08 ENCOUNTER — Encounter: Payer: Self-pay | Admitting: Family Medicine

## 2019-01-08 ENCOUNTER — Ambulatory Visit (INDEPENDENT_AMBULATORY_CARE_PROVIDER_SITE_OTHER): Payer: BC Managed Care – PPO | Admitting: Family Medicine

## 2019-01-08 ENCOUNTER — Other Ambulatory Visit: Payer: Self-pay

## 2019-01-08 VITALS — BP 116/72 | HR 89 | Temp 98.6°F | Ht 63.0 in | Wt 143.5 lb

## 2019-01-08 DIAGNOSIS — Z7189 Other specified counseling: Secondary | ICD-10-CM

## 2019-01-08 DIAGNOSIS — Z01818 Encounter for other preprocedural examination: Secondary | ICD-10-CM

## 2019-01-08 LAB — SARS CORONAVIRUS 2 (TAT 6-24 HRS): SARS Coronavirus 2: NEGATIVE

## 2019-01-08 NOTE — Progress Notes (Signed)
Impression and Recommendations:    1. Preoperative clearance    Preoperative clearance  - Plan: Patient is in very good cardiovascular shape especially for age etc.   Connie Cole has nothing that is going to preclude her from undergoing surgery in 2 days.   - she has no family history of any problems with anesthesia or denies history herself.   - Blood pressure and electrolytes etc. are all within normal limits.  Gross side effects, risk and benefits, and alternatives of medications and treatment plan in general discussed with patient.  Patient is aware that all medications have potential side effects and we are unable to predict every side effect or drug-drug interaction that may occur.   Patient will call with any questions prior to using medication if they have concerns.    Expresses verbal understanding and consents to current therapy and treatment regimen.  No barriers to understanding were identified.  Red flag symptoms and signs discussed in detail.  Patient expressed understanding regarding what to do in case of emergency\urgent symptoms  Please see AVS handed out to patient at the end of our visit for further patient instructions/ counseling done pertaining to today's office visit.   Return for Chronic conditions as previously discussed.     Note:  This note was prepared with assistance of Dragon voice recognition software. Occasional wrong-word or sound-a-like substitutions may have occurred due to the inherent limitations of voice recognition software.   ----------------------------------------------------------------------------------------------------------------------------------------------------------------------------------------------------------------------------------------------------------    Subjective:     HPI: Connie Cole is a 48 y.o. female who presents to [place] [facility] today for issues as discussed below.   Pt getting bld  work from the [employer] prior to sx- getting hip replacemnet on Thursday- R side by Dr Alvan Dame.   Pt not sure why she is here  -She does not have any paperwork from Dr. Alvan Dame but we will request them.  -Review of systems from cardiopulmonary standpoint is negative.  She has no dyspnea on exertion or any new symptoms.  Patient's METS is at least 8-10.  She was extremely active prior to the hip slowing her down.  She was extremely active and in great shape prior to her hip slowing her down.  No difficulties with her ADLs, no shortness of breath or difficulty with intimacy with partner/ intense activity.  Patient understand that she will be getting general anesthesia.  Patient has had tonsillectomy as well as a colonoscopy the past and noted problems with anesthesia.  She has no family history of known problems of anesthesia reactions to it.   Patient colonoscopy last week through Dr. Collene Mares of gastroenterology.  I do not have those records.  She was told that time with her calcium was low and let me know about it.  However I do not have any of his records yet.   Wt Readings from Last 3 Encounters:  01/10/19 144 lb 8 oz (65.5 kg)  01/09/19 144 lb 8 oz (65.5 kg)  01/08/19 143 lb 8 oz (65.1 kg)   BP Readings from Last 3 Encounters:  01/11/19 101/69  01/09/19 117/86  01/08/19 116/72   Pulse Readings from Last 3 Encounters:  01/11/19 74  01/09/19 89  01/08/19 89   BMI Readings from Last 3 Encounters:  01/10/19 25.60 kg/m  01/09/19 25.60 kg/m  01/08/19 25.42 kg/m     Patient Care Team    Relationship Specialty Notifications Start End  [name], DO PCP -  General Family Medicine  12/10/15   Brien Few, MD Consulting Physician Obstetrics and Gynecology  05/16/17   Merrilee Seashore, MD Consulting Physician Internal Medicine  05/16/17    Comment: Actually sees Dr. Felicie Morn at [facility]    07/24/18      Patient Active Problem List    Diagnosis Date Noted  . Acute reaction to situational stress 05/16/2017    Priority: High  . GAD (generalized anxiety disorder) 05/16/2017    Priority: High  . Stress-related physiological response affecting physical condition 05/16/2017    Priority: Medium  . GERD (gastroesophageal reflux disease) 12/16/2015    Priority: Medium  . Tinea corporis (h/o tinea versicolor per pt) 12/20/2015    Priority: Low  . Overweight (BMI 25.0-29.9) 01/11/2019  . Hypokalemia 01/11/2019  . S/P right THA, AA 01/10/2019  . Cough 07/09/2018  . Hyperprolactinemia ([place]) 03/06/2018  . Pain in joint of right hip 12/25/2017  . Environmental and seasonal allergies 11/15/2017  . Chronic pansinusitis 11/15/2017  . Cervical disc disorder 11/15/2017  . Muscle spasm- neck muscles 11/15/2017  . H/O: pituitary tumor 09/06/2017  . Sleeping difficulty 09/06/2017  . Subclinical hypothyroidism 09/06/2017  . Generalized osteoarthritis of multiple sites 09/06/2017  . Numbness and tingling in both arms/ hands 05/16/2017  . Non-cardiac chest pain- assoc with emotional stress 05/16/2017  . Palpitations 01/18/2016  . Family history of stroke (cerebrovascular)- mother 17's 01/18/2016  . Family history of atrial fibrillation 01/18/2016  . Seborrheic dermatitis of scalp 12/16/2015    Past Medical history, Surgical history, Family history, Social history, Allergies and Medications have been entered into the medical record, reviewed and changed as needed.    Current Meds  Medication Sig  . albuterol (PROVENTIL HFA;VENTOLIN HFA) 108 (90 Base) MCG/ACT inhaler Inhale 1-2 puffs into the lungs every 6 (six) hours as needed for wheezing or shortness of breath.  Marland Kitchen amitriptyline (ELAVIL) 25 MG tablet Take 1 tablet (25 mg total) by mouth at bedtime.  . busPIRone (BUSPAR) 15 MG tablet Take 1 tablet (15 mg total) by mouth 2 (two) times daily. (Patient taking differently: Take 7.5-15 mg by mouth daily as needed (anxiety). )  . COLLAGEN  PO Take 1 capsule by mouth daily. Ultimate Collagen  . fluticasone (FLONASE) 50 MCG/ACT nasal spray 1 spray each nostril following sinus rinses twice daily  . Krill Oil 500 MG CAPS Take 500 mg by mouth daily.  . Misc Natural Products (BLACK CHERRY CONCENTRATE PO) Take 1,400 mg by mouth daily.  . sertraline (ZOLOFT) 50 MG tablet Take 2 tablets (100 mg total) by mouth daily.  . [DISCONTINUED] cyclobenzaprine (FLEXERIL) 10 MG tablet Take 1 tablet (10 mg total) by mouth 3 (three) times daily as needed for muscle spasms.  . [DISCONTINUED] ibuprofen (ADVIL) 200 MG tablet Take 800 mg by mouth every 8 (eight) hours as needed (pain.).  . [DISCONTINUED] ibuprofen (ADVIL,MOTRIN) 600 MG tablet Take 1 tablet (600 mg total) by mouth every 6 (six) hours as needed.  . [DISCONTINUED] naproxen sodium (ALEVE) 220 MG tablet Take 220 mg by mouth daily as needed (pain.).    Allergies:  Allergies  Allergen Reactions  . Latex Swelling  . Latex Rash     Review of Systems:  A fourteen system review of systems was performed and found to be positive as per HPI.   Objective:   Blood pressure 116/72, pulse 89, temperature 98.6 F (37 C), height 5\' 3"  (1.6 m), weight 143 lb 8 oz (65.1  kg), SpO2 98 %. Body mass index is 25.42 kg/m. General:  Well Developed, well nourished, appropriate for stated age.  Neuro:  Alert and oriented,  extra-ocular muscles intact  HEENT:  Normocephalic, atraumatic, neck supple, no carotid bruits appreciated  Skin:  no gross rash, warm, pink. Cardiac:  RRR, S1 S2 Respiratory:  ECTA B/L and A/P, Not using accessory muscles, speaking in full sentences- unlabored. Vascular:  Ext warm, no cyanosis apprec.; cap RF less 2 sec. Psych:  No HI/SI, judgement and insight good, Euthymic mood. Full Affect.

## 2019-01-08 NOTE — Patient Instructions (Addendum)
YOU HAD A COVID 19 TEST ON 7-`08-2018. ONCE YOUR COVID TEST IS COMPLETED, PLEASE BEGIN THE QUARANTINE INSTRUCTIONS AS OUTLINED IN YOUR HANDOUT.                Yasmine Kilbourne     Your procedure is scheduled on: 01-09-2019   Report to [place]  Entrance  Report to admitting at 7:30 AM      Call this number if you have problems the morning of surgery [phone]     Remember: [place], [place].   NO SOLID FOOD AFTER MIDNIGHT THE NIGHT PRIOR TO SURGERY . NOTHING BY MOUTH EXCEPT CLEAR LIQUIDS UNTIL 7:00 AM. .  PLEASE FINISH ENSURE DRINK PER SURGEON ORDER 3 HOURS PRIOR TO SCHEDULED SURGERY TIME WHICH NEEDS TO BE COMPLETED AT 7:00 AM.   CLEAR LIQUID DIET   Foods Allowed                                                                     Foods Excluded  Coffee and tea, regular and decaf                             liquids that you cannot  Plain Jell-O in any flavor                                             see through such as: Fruit ices (not with fruit pulp)                                     milk, soups, orange juice  Iced Popsicles                                    All solid food Carbonated beverages, regular and diet                                    Cranberry, grape and apple juices Sports drinks like Gatorade Lightly seasoned clear broth or consume(fat free) Sugar, honey syrup  Sample Menu Breakfast                                Lunch                                     Supper Cranberry juice                    Beef broth                            Chicken broth Jell-O  Grape juice                           Apple juice Coffee or tea                        Jell-O                                      Popsicle                                                Coffee or tea                        Coffee or  tea  _____________________________________________________________________     Take these medicines the morning of surgery with A SIP OF WATER:  Zoloft, Flonase, Buspar, and bring your inhaler to the hospital with you.                                You may not have any metal on your body including hair pins and              piercings              Do not wear jewelry, make-up, lotions, powders or perfumes, deodorant             Do not wear nail polish.  Do not shave  48 hours prior to surgery.                 Do not bring valuables to the hospital. [place].  Contacts, dentures or bridgework may not be worn into surgery.                  Please read over the following fact sheets you were given: _____________________________________________________________________             [facility] - Preparing for Surgery Before surgery, you can play an important role.   Because skin is not sterile, your skin needs to be as free of germs as possible.   You can reduce the number of germs on your skin by washing with CHG (chlorahexidine gluconate) soap before surgery.   CHG is an antiseptic cleaner which kills germs and bonds with the skin to continue killing germs even after washing. Please DO NOT use if you have an allergy to CHG or antibacterial soaps.   If your skin becomes reddened/irritated stop using the CHG and inform your nurse when you arrive at Short Stay. Do not shave (including legs and underarms) for at least 48 hours prior to the first CHG showerPlease follow these instructions carefully:  1.  Shower with CHG Soap the night before surgery and the  morning of Surgery.  2.  If you choose to wash your hair, wash your hair first as usual with your  normal  shampoo.  3.  After you shampoo, rinse your hair and body thoroughly to remove the  shampoo.  4.  Use CHG as you would any other liquid  soap.  You can apply chg directly  to the skin and wash                       Gently with a scrungie or clean washcloth.  5.  Apply the CHG Soap to your body ONLY FROM THE NECK DOWN.   Do not use on face/ open                           Wound or open sores. Avoid contact with eyes, ears mouth and genitals (private parts).                       Wash face,  Genitals (private parts) with your normal soap.             6.  Wash thoroughly, paying special attention to the area where your surgery  will be performed.  7.  Thoroughly rinse your body with warm water from the neck down.  8.  DO NOT shower/wash with your normal soap after using and rinsing off  the CHG Soap.             9.  Pat yourself dry with a clean towel.            10.  Wear clean pajamas.            11.  Place clean sheets on your bed the night of your first shower and do not  sleep with pets.  Day of Surgery : Do not apply any lotions/deodorants the morning of surgery.  Please wear clean clothes to the hospital/surgery center.   FAILURE TO FOLLOW THESE INSTRUCTIONS MAY RESULT IN THE CANCELLATION OF YOUR SURGERY PATIENT SIGNATURE_________________________________  NURSE SIGNATURE__________________________________  ________________________________________________________________________   Adam Phenix  An incentive spirometer is a tool that can help keep your lungs clear and active. This tool measures how well you are filling your lungs with each breath. Taking long deep breaths may help reverse or decrease the chance of developing breathing (pulmonary) problems (especially infection) following:  A long period of time when you are unable to move or be active. BEFORE THE PROCEDURE   If the spirometer includes an indicator to show your best effort, your nurse or respiratory therapist will set it to a desired goal.  If possible, sit up straight or lean slightly forward. Try not to slouch.  Hold the incentive  spirometer in an upright position. INSTRUCTIONS FOR USE  1. Sit on the edge of your bed if possible, or sit up as far as you can in bed or on a chair. 2. Hold the incentive spirometer in an upright position. 3. Breathe out normally. 4. Place the mouthpiece in your mouth and seal your lips tightly around it. 5. Breathe in slowly and as deeply as possible, raising the piston or the ball toward the top of the column. 6. Hold your breath for 3-5 seconds or for as long as possible. Allow the piston or ball to fall to the bottom of the column. 7. Remove the mouthpiece from your mouth and breathe out normally. 8. Rest for a few seconds and repeat Steps 1 through 7 at least 10 times every 1-2 hours when you are awake. Take your time and take a few normal breaths between deep breaths. 9. The spirometer may include an indicator to show  your best effort. Use the indicator as a goal to work toward during each repetition. 10. After each set of 10 deep breaths, practice coughing to be sure your lungs are clear. If you have an incision (the cut made at the time of surgery), support your incision when coughing by placing a pillow or rolled up towels firmly against it. Once you are able to get out of bed, walk around indoors and cough well. You may stop using the incentive spirometer when instructed by your caregiver.  RISKS AND COMPLICATIONS  Take your time so you do not get dizzy or light-headed.  If you are in pain, you may need to take or ask for pain medication before doing incentive spirometry. It is harder to take a deep breath if you are having pain. AFTER USE  Rest and breathe slowly and easily.  It can be helpful to keep track of a log of your progress. Your caregiver can provide you with a simple table to help with this. If you are using the spirometer at home, follow these instructions: [place] IF:   You are having difficultly using the spirometer.  You have trouble using the  spirometer as often as instructed.  Your pain medication is not giving enough relief while using the spirometer.  You develop fever of 100.5 F (38.1 C) or higher. SEEK IMMEDIATE MEDICAL CARE IF:   You cough up bloody sputum that had not been present before.  You develop fever of 102 F (38.9 C) or greater.  You develop worsening pain at or near the incision site. MAKE SURE YOU:   Understand these instructions.  Will watch your condition.  Will get help right away if you are not doing well or get worse. Document Released: 10/24/2006 Document Revised: 09/05/2011 Document Reviewed: 12/25/2006 Exit[employer] Patient Information 2014 [employer], [place].   ________________________________________________________________________  WHAT IS A BLOOD TRANSFUSION? Blood Transfusion Information  A transfusion is the replacement of blood or some of its parts. Blood is made up of multiple cells which provide different functions.  Red blood cells carry oxygen and are used for blood loss replacement.  White blood cells fight against infection.  Platelets control bleeding.  Plasma helps clot blood.  Other blood products are available for specialized needs, such as hemophilia or other clotting disorders. BEFORE THE TRANSFUSION  Who gives blood for transfusions?   Healthy volunteers who are fully evaluated to make sure their blood is safe. This is blood bank blood. Transfusion therapy is the safest it has ever been in the practice of medicine. Before blood is taken from a donor, a complete history is taken to make sure that person has no history of diseases nor engages in risky social behavior (examples are intravenous drug use or sexual activity with multiple partners). The donor's travel history is screened to minimize risk of transmitting infections, such as malaria. The donated blood is tested for signs of infectious diseases, such as HIV and hepatitis. The blood is then tested to be sure it is  compatible with you in order to minimize the chance of a transfusion reaction. If you or a relative donates blood, this is often done in anticipation of surgery and is not appropriate for emergency situations. It takes many days to process the donated blood. RISKS AND COMPLICATIONS Although transfusion therapy is very safe and saves many lives, the main dangers of transfusion include:   Getting an infectious disease.  Developing a transfusion reaction. This is an allergic reaction to  something in the blood you were given. Every precaution is taken to prevent this. The decision to have a blood transfusion has been considered carefully by your caregiver before blood is given. Blood is not given unless the benefits outweigh the risks. AFTER THE TRANSFUSION  Right after receiving a blood transfusion, you will usually feel much better and more energetic. This is especially true if your red blood cells have gotten low (anemic). The transfusion raises the level of the red blood cells which carry oxygen, and this usually causes an energy increase.  The nurse administering the transfusion will monitor you carefully for complications. HOME CARE INSTRUCTIONS  No special instructions are needed after a transfusion. You may find your energy is better. Speak with your caregiver about any limitations on activity for underlying diseases you may have. SEEK MEDICAL CARE IF:   Your condition is not improving after your transfusion.  You develop redness or irritation at the intravenous (IV) site. SEEK IMMEDIATE MEDICAL CARE IF:  Any of the following symptoms occur over the next 12 hours:  Shaking chills.  You have a temperature by mouth above 102 F (38.9 C), not controlled by medicine.  Chest, back, or muscle pain.  People around you feel you are not acting correctly or are confused.  Shortness of breath or difficulty breathing.  Dizziness and fainting.  You get a rash or develop hives.  You have  a decrease in urine output.  Your urine turns a dark color or changes to pink, red, or brown. Any of the following symptoms occur over the next 10 days:  You have a temperature by mouth above 102 F (38.9 C), not controlled by medicine.  Shortness of breath.  Weakness after normal activity.  The white part of the eye turns yellow (jaundice).  You have a decrease in the amount of urine or are urinating less often.  Your urine turns a dark color or changes to pink, red, or brown. Document Released: 06/10/2000 Document Revised: 09/05/2011 Document Reviewed: 01/28/2008 [facility] Patient Information 2014 [place], [place].  _______________________________________________________________________

## 2019-01-08 NOTE — Patient Instructions (Signed)
Tell patient no need for EKG or any other additional tests since she is getting blood work tomorrow I would assume that would be at minimum a CBC and a CMP.  Patient will let me know if this is not the case.  -Do not see any reason why patient cannot undergo total hip replacement this Thursday-in 2 days.

## 2019-01-09 ENCOUNTER — Encounter (HOSPITAL_COMMUNITY)
Admission: RE | Admit: 2019-01-09 | Discharge: 2019-01-09 | Disposition: A | Discharge status: Home / Self Care | Payer: BC Managed Care – PPO | Source: Ambulatory Visit | Attending: Orthopedic Surgery | Admitting: Orthopedic Surgery

## 2019-01-09 ENCOUNTER — Other Ambulatory Visit: Payer: Self-pay

## 2019-01-09 ENCOUNTER — Encounter (HOSPITAL_COMMUNITY): Payer: Self-pay

## 2019-01-09 DIAGNOSIS — E876 Hypokalemia: Secondary | ICD-10-CM | POA: Diagnosis not present

## 2019-01-09 DIAGNOSIS — F419 Anxiety disorder, unspecified: Secondary | ICD-10-CM | POA: Diagnosis not present

## 2019-01-09 DIAGNOSIS — Z471 Aftercare following joint replacement surgery: Secondary | ICD-10-CM | POA: Diagnosis not present

## 2019-01-09 DIAGNOSIS — Z7951 Long term (current) use of inhaled steroids: Secondary | ICD-10-CM | POA: Diagnosis not present

## 2019-01-09 DIAGNOSIS — Z79899 Other long term (current) drug therapy: Secondary | ICD-10-CM | POA: Diagnosis not present

## 2019-01-09 DIAGNOSIS — K219 Gastro-esophageal reflux disease without esophagitis: Secondary | ICD-10-CM | POA: Diagnosis not present

## 2019-01-09 DIAGNOSIS — E039 Hypothyroidism, unspecified: Secondary | ICD-10-CM | POA: Diagnosis not present

## 2019-01-09 DIAGNOSIS — Z87891 Personal history of nicotine dependence: Secondary | ICD-10-CM | POA: Diagnosis not present

## 2019-01-09 DIAGNOSIS — K5900 Constipation, unspecified: Secondary | ICD-10-CM | POA: Diagnosis not present

## 2019-01-09 DIAGNOSIS — M1611 Unilateral primary osteoarthritis, right hip: Secondary | ICD-10-CM | POA: Diagnosis not present

## 2019-01-09 DIAGNOSIS — Z96641 Presence of right artificial hip joint: Secondary | ICD-10-CM | POA: Diagnosis not present

## 2019-01-09 DIAGNOSIS — F411 Generalized anxiety disorder: Secondary | ICD-10-CM | POA: Diagnosis not present

## 2019-01-09 LAB — CBC
HCT: 40.3 % (ref 36.0–46.0)
Hemoglobin: 12.9 g/dL (ref 12.0–15.0)
MCH: 33.9 pg (ref 26.0–34.0)
MCHC: 32 g/dL (ref 30.0–36.0)
MCV: 105.8 fL — ABNORMAL HIGH (ref 80.0–100.0)
Platelets: 313 10*3/uL (ref 150–400)
RBC: 3.81 MIL/uL — ABNORMAL LOW (ref 3.87–5.11)
RDW: 11.9 % (ref 11.5–15.5)
WBC: 4 10*3/uL (ref 4.0–10.5)
nRBC: 0 % (ref 0.0–0.2)

## 2019-01-09 LAB — ABO/RH: ABO/RH(D): A POS

## 2019-01-09 LAB — SURGICAL PCR SCREEN
MRSA, PCR: NEGATIVE
Staphylococcus aureus: NEGATIVE

## 2019-01-10 ENCOUNTER — Inpatient Hospital Stay (HOSPITAL_COMMUNITY): Payer: BC Managed Care – PPO | Admitting: Certified Registered Nurse Anesthetist

## 2019-01-10 ENCOUNTER — Inpatient Hospital Stay (HOSPITAL_COMMUNITY): Payer: BC Managed Care – PPO

## 2019-01-10 ENCOUNTER — Encounter (HOSPITAL_COMMUNITY)
Admission: RE | Disposition: A | Discharge status: Home / Self Care | Payer: Self-pay | Source: Home / Self Care | Attending: Orthopedic Surgery

## 2019-01-10 ENCOUNTER — Encounter (HOSPITAL_COMMUNITY): Payer: Self-pay | Admitting: *Deleted

## 2019-01-10 ENCOUNTER — Inpatient Hospital Stay (HOSPITAL_COMMUNITY)
Admission: RE | Admit: 2019-01-10 | Discharge: 2019-01-11 | DRG: 470 | Disposition: A | Discharge status: Home / Self Care | Payer: BC Managed Care – PPO | Attending: Orthopedic Surgery | Admitting: Orthopedic Surgery

## 2019-01-10 DIAGNOSIS — Z96641 Presence of right artificial hip joint: Secondary | ICD-10-CM

## 2019-01-10 DIAGNOSIS — Z6827 Body mass index (BMI) 27.0-27.9, adult: Secondary | ICD-10-CM | POA: Diagnosis present

## 2019-01-10 DIAGNOSIS — Z87891 Personal history of nicotine dependence: Secondary | ICD-10-CM

## 2019-01-10 DIAGNOSIS — K5900 Constipation, unspecified: Secondary | ICD-10-CM | POA: Diagnosis present

## 2019-01-10 DIAGNOSIS — Z96649 Presence of unspecified artificial hip joint: Secondary | ICD-10-CM

## 2019-01-10 DIAGNOSIS — F411 Generalized anxiety disorder: Secondary | ICD-10-CM | POA: Diagnosis present

## 2019-01-10 DIAGNOSIS — E876 Hypokalemia: Secondary | ICD-10-CM | POA: Diagnosis present

## 2019-01-10 DIAGNOSIS — E039 Hypothyroidism, unspecified: Secondary | ICD-10-CM | POA: Diagnosis present

## 2019-01-10 DIAGNOSIS — Z7951 Long term (current) use of inhaled steroids: Secondary | ICD-10-CM

## 2019-01-10 DIAGNOSIS — Z6829 Body mass index (BMI) 29.0-29.9, adult: Secondary | ICD-10-CM | POA: Diagnosis present

## 2019-01-10 DIAGNOSIS — Z79899 Other long term (current) drug therapy: Secondary | ICD-10-CM

## 2019-01-10 DIAGNOSIS — Z471 Aftercare following joint replacement surgery: Secondary | ICD-10-CM | POA: Diagnosis not present

## 2019-01-10 DIAGNOSIS — K219 Gastro-esophageal reflux disease without esophagitis: Secondary | ICD-10-CM | POA: Diagnosis present

## 2019-01-10 DIAGNOSIS — Z419 Encounter for procedure for purposes other than remedying health state, unspecified: Secondary | ICD-10-CM

## 2019-01-10 DIAGNOSIS — E663 Overweight: Secondary | ICD-10-CM | POA: Diagnosis present

## 2019-01-10 DIAGNOSIS — Z6828 Body mass index (BMI) 28.0-28.9, adult: Secondary | ICD-10-CM | POA: Diagnosis present

## 2019-01-10 DIAGNOSIS — M1611 Unilateral primary osteoarthritis, right hip: Secondary | ICD-10-CM | POA: Diagnosis present

## 2019-01-10 HISTORY — PX: TOTAL HIP ARTHROPLASTY: SHX124

## 2019-01-10 LAB — PREGNANCY, URINE: Preg Test, Ur: NEGATIVE

## 2019-01-10 LAB — TYPE AND SCREEN
ABO/RH(D): A POS
Antibody Screen: NEGATIVE

## 2019-01-10 SURGERY — ARTHROPLASTY, HIP, TOTAL, ANTERIOR APPROACH
Anesthesia: Spinal | Site: Hip | Laterality: Right

## 2019-01-10 MED ORDER — PROPOFOL 10 MG/ML IV BOLUS
INTRAVENOUS | Status: DC | PRN
  Administered 2019-01-10: 20 mg via INTRAVENOUS
  Administered 2019-01-10: 10 mg via INTRAVENOUS
  Administered 2019-01-10: 20 mg via INTRAVENOUS

## 2019-01-10 MED ORDER — ALUM & MAG HYDROXIDE-SIMETH 200-200-20 MG/5ML PO SUSP: 15.0000 mL | ORAL | Status: DC | PRN

## 2019-01-10 MED ORDER — BUSPIRONE HCL 5 MG PO TABS
7.5000 mg | ORAL_TABLET | Freq: Every day | ORAL | Status: DC | PRN
  Administered 2019-01-10: 15 mg via ORAL
  Filled 2019-01-10: qty 3

## 2019-01-10 MED ORDER — MIDAZOLAM HCL 5 MG/5ML IJ SOLN
INTRAMUSCULAR | Status: DC | PRN
  Administered 2019-01-10: 2 mg via INTRAVENOUS

## 2019-01-10 MED ORDER — ONDANSETRON HCL 4 MG/2ML IJ SOLN: 4.0000 mg | Freq: Four times a day (QID) | INTRAMUSCULAR | Status: DC | PRN

## 2019-01-10 MED ORDER — LACTATED RINGERS IV SOLN
INTRAVENOUS | Status: DC
  Administered 2019-01-10 (×3): via INTRAVENOUS

## 2019-01-10 MED ORDER — ONDANSETRON HCL 4 MG/2ML IJ SOLN
INTRAMUSCULAR | Status: DC | PRN
  Administered 2019-01-10: 4 mg via INTRAVENOUS

## 2019-01-10 MED ORDER — OXYCODONE HCL 5 MG/5ML PO SOLN: 5.0000 mg | Freq: Once | ORAL | Status: DC | PRN

## 2019-01-10 MED ORDER — MIDAZOLAM HCL 2 MG/2ML IJ SOLN
INTRAMUSCULAR | Status: AC
  Filled 2019-01-10: qty 2

## 2019-01-10 MED ORDER — HYDROCODONE-ACETAMINOPHEN 5-325 MG PO TABS
1.0000 | ORAL_TABLET | ORAL | Status: DC | PRN
  Administered 2019-01-11: 02:00:00 2 via ORAL
  Filled 2019-01-10: qty 2

## 2019-01-10 MED ORDER — FENTANYL CITRATE (PF) 100 MCG/2ML IJ SOLN: 25.0000 ug | INTRAMUSCULAR | Status: DC | PRN

## 2019-01-10 MED ORDER — TRANEXAMIC ACID-NACL 1000-0.7 MG/100ML-% IV SOLN
1000.0000 mg | Freq: Once | INTRAVENOUS | Status: AC
  Administered 2019-01-10: 14:00:00 1000 mg via INTRAVENOUS
  Filled 2019-01-10: qty 100

## 2019-01-10 MED ORDER — AMITRIPTYLINE HCL 25 MG PO TABS
25.0000 mg | ORAL_TABLET | Freq: Every day | ORAL | Status: DC
  Administered 2019-01-10: 23:00:00 25 mg via ORAL
  Filled 2019-01-10: qty 1

## 2019-01-10 MED ORDER — PROPOFOL 10 MG/ML IV BOLUS
INTRAVENOUS | Status: AC
  Filled 2019-01-10: qty 40

## 2019-01-10 MED ORDER — ONDANSETRON HCL 4 MG/2ML IJ SOLN
4.0000 mg | Freq: Four times a day (QID) | INTRAMUSCULAR | Status: DC | PRN
  Administered 2019-01-10: 21:00:00 4 mg via INTRAVENOUS
  Filled 2019-01-10: qty 2

## 2019-01-10 MED ORDER — PHENOL 1.4 % MT LIQD: 1.0000 | OROMUCOSAL | Status: DC | PRN

## 2019-01-10 MED ORDER — MAGNESIUM CITRATE PO SOLN: 1.0000 | Freq: Once | ORAL | Status: DC | PRN

## 2019-01-10 MED ORDER — ASPIRIN 81 MG PO CHEW
81.0000 mg | CHEWABLE_TABLET | Freq: Two times a day (BID) | ORAL | Status: DC
  Administered 2019-01-10 – 2019-01-11 (×2): 81 mg via ORAL
  Filled 2019-01-10 (×2): qty 1

## 2019-01-10 MED ORDER — FENTANYL CITRATE (PF) 100 MCG/2ML IJ SOLN
INTRAMUSCULAR | Status: AC
  Filled 2019-01-10: qty 2

## 2019-01-10 MED ORDER — METHOCARBAMOL 500 MG IVPB - SIMPLE MED
500.0000 mg | Freq: Four times a day (QID) | INTRAVENOUS | Status: DC | PRN
  Filled 2019-01-10: qty 50

## 2019-01-10 MED ORDER — HYDROCODONE-ACETAMINOPHEN 7.5-325 MG PO TABS
1.0000 | ORAL_TABLET | ORAL | Status: DC | PRN
  Administered 2019-01-10 (×2): 1 via ORAL
  Administered 2019-01-10: 19:00:00 2 via ORAL
  Filled 2019-01-10: qty 1
  Filled 2019-01-10 (×2): qty 2
  Filled 2019-01-10: qty 1
  Filled 2019-01-10: qty 2

## 2019-01-10 MED ORDER — DEXAMETHASONE SODIUM PHOSPHATE 10 MG/ML IJ SOLN
10.0000 mg | Freq: Once | INTRAMUSCULAR | Status: AC
  Administered 2019-01-11: 09:00:00 10 mg via INTRAVENOUS
  Filled 2019-01-10: qty 1

## 2019-01-10 MED ORDER — ACETAMINOPHEN 325 MG PO TABS: 325.0000 mg | ORAL_TABLET | Freq: Four times a day (QID) | ORAL | Status: DC | PRN

## 2019-01-10 MED ORDER — SODIUM CHLORIDE 0.9 % IR SOLN
Status: DC | PRN
  Administered 2019-01-10: 1000 mL

## 2019-01-10 MED ORDER — METOCLOPRAMIDE HCL 5 MG/ML IJ SOLN
5.0000 mg | Freq: Three times a day (TID) | INTRAMUSCULAR | Status: DC | PRN
  Administered 2019-01-10: 22:00:00 10 mg via INTRAVENOUS
  Administered 2019-01-11: 09:00:00 5 mg via INTRAVENOUS
  Filled 2019-01-10 (×2): qty 2

## 2019-01-10 MED ORDER — CEFAZOLIN SODIUM-DEXTROSE 2-4 GM/100ML-% IV SOLN
2.0000 g | INTRAVENOUS | Status: AC
  Administered 2019-01-10: 2 g via INTRAVENOUS
  Filled 2019-01-10: qty 100

## 2019-01-10 MED ORDER — ALBUTEROL SULFATE (2.5 MG/3ML) 0.083% IN NEBU
3.0000 mL | INHALATION_SOLUTION | Freq: Four times a day (QID) | RESPIRATORY_TRACT | Status: DC | PRN
Start: 2019-01-10 — End: 2019-01-11

## 2019-01-10 MED ORDER — DIPHENHYDRAMINE HCL 12.5 MG/5ML PO ELIX: 12.5000 mg | ORAL_SOLUTION | ORAL | Status: DC | PRN

## 2019-01-10 MED ORDER — EPHEDRINE SULFATE-NACL 50-0.9 MG/10ML-% IV SOSY
PREFILLED_SYRINGE | INTRAVENOUS | Status: DC | PRN
  Administered 2019-01-10: 10 mg via INTRAVENOUS

## 2019-01-10 MED ORDER — STERILE WATER FOR IRRIGATION IR SOLN
Status: DC | PRN
  Administered 2019-01-10 (×2): 1000 mL

## 2019-01-10 MED ORDER — FERROUS SULFATE 325 (65 FE) MG PO TABS
325.0000 mg | ORAL_TABLET | Freq: Three times a day (TID) | ORAL | Status: DC
  Administered 2019-01-10 – 2019-01-11 (×2): 325 mg via ORAL
  Filled 2019-01-10 (×2): qty 1

## 2019-01-10 MED ORDER — HYDROMORPHONE HCL 1 MG/ML IJ SOLN
0.5000 mg | INTRAMUSCULAR | Status: DC | PRN
  Administered 2019-01-10 (×2): 1 mg via INTRAVENOUS
  Administered 2019-01-11 (×3): 0.5 mg via INTRAVENOUS
  Filled 2019-01-10 (×4): qty 1

## 2019-01-10 MED ORDER — PROPOFOL 500 MG/50ML IV EMUL
INTRAVENOUS | Status: DC | PRN
  Administered 2019-01-10: 125 ug/kg/min via INTRAVENOUS

## 2019-01-10 MED ORDER — PROPOFOL 10 MG/ML IV BOLUS
INTRAVENOUS | Status: AC
  Filled 2019-01-10: qty 20

## 2019-01-10 MED ORDER — TRANEXAMIC ACID-NACL 1000-0.7 MG/100ML-% IV SOLN
1000.0000 mg | INTRAVENOUS | Status: AC
  Administered 2019-01-10: 1000 mg via INTRAVENOUS
  Filled 2019-01-10: qty 100

## 2019-01-10 MED ORDER — METOCLOPRAMIDE HCL 5 MG PO TABS: 5.0000 mg | ORAL_TABLET | Freq: Three times a day (TID) | ORAL | Status: DC | PRN

## 2019-01-10 MED ORDER — METHOCARBAMOL 500 MG PO TABS
500.0000 mg | ORAL_TABLET | Freq: Four times a day (QID) | ORAL | Status: DC | PRN
  Administered 2019-01-10 – 2019-01-11 (×3): 500 mg via ORAL
  Filled 2019-01-10 (×4): qty 1

## 2019-01-10 MED ORDER — BISACODYL 10 MG RE SUPP: 10.0000 mg | Freq: Every day | RECTAL | Status: DC | PRN

## 2019-01-10 MED ORDER — DEXAMETHASONE SODIUM PHOSPHATE 10 MG/ML IJ SOLN
10.0000 mg | Freq: Once | INTRAMUSCULAR | Status: AC
  Administered 2019-01-10: 8 mg via INTRAVENOUS

## 2019-01-10 MED ORDER — EPHEDRINE 5 MG/ML INJ
INTRAVENOUS | Status: AC
  Filled 2019-01-10: qty 10

## 2019-01-10 MED ORDER — MENTHOL 3 MG MT LOZG: 1.0000 | LOZENGE | OROMUCOSAL | Status: DC | PRN

## 2019-01-10 MED ORDER — OXYCODONE HCL 5 MG PO TABS: 5.0000 mg | ORAL_TABLET | Freq: Once | ORAL | Status: DC | PRN

## 2019-01-10 MED ORDER — PHENYLEPHRINE 40 MCG/ML (10ML) SYRINGE FOR IV PUSH (FOR BLOOD PRESSURE SUPPORT)
PREFILLED_SYRINGE | INTRAVENOUS | Status: AC
  Filled 2019-01-10: qty 20

## 2019-01-10 MED ORDER — SERTRALINE HCL 100 MG PO TABS
100.0000 mg | ORAL_TABLET | Freq: Every day | ORAL | Status: DC
  Administered 2019-01-11: 100 mg via ORAL
  Filled 2019-01-10: qty 1

## 2019-01-10 MED ORDER — FENTANYL CITRATE (PF) 100 MCG/2ML IJ SOLN
INTRAMUSCULAR | Status: DC | PRN
  Administered 2019-01-10 (×2): 50 ug via INTRAVENOUS

## 2019-01-10 MED ORDER — CEFAZOLIN SODIUM-DEXTROSE 2-4 GM/100ML-% IV SOLN
2.0000 g | Freq: Four times a day (QID) | INTRAVENOUS | Status: AC
  Administered 2019-01-10 (×2): 2 g via INTRAVENOUS
  Filled 2019-01-10 (×2): qty 100

## 2019-01-10 MED ORDER — CELECOXIB 200 MG PO CAPS
200.0000 mg | ORAL_CAPSULE | Freq: Two times a day (BID) | ORAL | Status: DC
  Administered 2019-01-10 – 2019-01-11 (×2): 200 mg via ORAL
  Filled 2019-01-10 (×2): qty 1

## 2019-01-10 MED ORDER — BUPIVACAINE IN DEXTROSE 0.75-8.25 % IT SOLN
INTRATHECAL | Status: DC | PRN
  Administered 2019-01-10: 1.8 mL via INTRATHECAL

## 2019-01-10 MED ORDER — SODIUM CHLORIDE 0.9 % IV SOLN
INTRAVENOUS | Status: DC
  Administered 2019-01-10 (×2): via INTRAVENOUS

## 2019-01-10 MED ORDER — PHENYLEPHRINE 40 MCG/ML (10ML) SYRINGE FOR IV PUSH (FOR BLOOD PRESSURE SUPPORT)
PREFILLED_SYRINGE | INTRAVENOUS | Status: DC | PRN
  Administered 2019-01-10 (×4): 80 ug via INTRAVENOUS
  Administered 2019-01-10: 120 ug via INTRAVENOUS
  Administered 2019-01-10 (×4): 80 ug via INTRAVENOUS

## 2019-01-10 MED ORDER — CHLORHEXIDINE GLUCONATE 4 % EX LIQD: 60.0000 mL | Freq: Once | CUTANEOUS | Status: DC

## 2019-01-10 MED ORDER — POLYETHYLENE GLYCOL 3350 17 G PO PACK: 17.0000 g | PACK | Freq: Two times a day (BID) | ORAL | Status: DC

## 2019-01-10 MED ORDER — DOCUSATE SODIUM 100 MG PO CAPS: 100.0000 mg | ORAL_CAPSULE | Freq: Two times a day (BID) | ORAL | Status: DC

## 2019-01-10 MED ORDER — ONDANSETRON HCL 4 MG PO TABS: 4.0000 mg | ORAL_TABLET | Freq: Four times a day (QID) | ORAL | Status: DC | PRN

## 2019-01-10 SURGICAL SUPPLY — 45 items
BAG DECANTER FOR FLEXI CONT (MISCELLANEOUS) IMPLANT
BAG ZIPLOCK 12X15 (MISCELLANEOUS) IMPLANT
BLADE SAG 18X100X1.27 (BLADE) ×3 IMPLANT
BLADE SURG SZ10 CARB STEEL (BLADE) ×6 IMPLANT
COVER PERINEAL POST (MISCELLANEOUS) ×3 IMPLANT
COVER SURGICAL LIGHT HANDLE (MISCELLANEOUS) ×3 IMPLANT
COVER WAND RF STERILE (DRAPES) IMPLANT
CUP ACETBLR 52 OD PINNACLE (Hips) ×3 IMPLANT
DERMABOND ADVANCED (GAUZE/BANDAGES/DRESSINGS) ×2
DERMABOND ADVANCED .7 DNX12 (GAUZE/BANDAGES/DRESSINGS) ×1 IMPLANT
DRAPE STERI IOBAN 125X83 (DRAPES) ×3 IMPLANT
DRAPE U-SHAPE 47X51 STRL (DRAPES) ×6 IMPLANT
DRESSING AQUACEL AG SP 3.5X10 (GAUZE/BANDAGES/DRESSINGS) ×1 IMPLANT
DRSG AQUACEL AG SP 3.5X10 (GAUZE/BANDAGES/DRESSINGS) ×3
DURAPREP 26ML APPLICATOR (WOUND CARE) ×3 IMPLANT
ELECT BLADE TIP CTD 4 INCH (ELECTRODE) ×3 IMPLANT
ELECT REM PT RETURN 15FT ADLT (MISCELLANEOUS) ×3 IMPLANT
ELIMINATOR HOLE APEX DEPUY (Hips) ×3 IMPLANT
GLOVE BIO SURGEON STRL SZ 6 (GLOVE) ×6 IMPLANT
GLOVE BIOGEL PI IND STRL 6.5 (GLOVE) ×1 IMPLANT
GLOVE BIOGEL PI IND STRL 7.5 (GLOVE) ×1 IMPLANT
GLOVE BIOGEL PI IND STRL 8.5 (GLOVE) ×1 IMPLANT
GLOVE BIOGEL PI INDICATOR 6.5 (GLOVE) ×2
GLOVE BIOGEL PI INDICATOR 7.5 (GLOVE) ×2
GLOVE BIOGEL PI INDICATOR 8.5 (GLOVE) ×2
GLOVE ECLIPSE 8.0 STRL XLNG CF (GLOVE) ×6 IMPLANT
GLOVE ORTHO TXT STRL SZ7.5 (GLOVE) ×6 IMPLANT
GOWN STRL REUS W/TWL LRG LVL3 (GOWN DISPOSABLE) ×6 IMPLANT
GOWN STRL REUS W/TWL XL LVL3 (GOWN DISPOSABLE) ×3 IMPLANT
HEAD CERAMIC DELTA 36 PLUS 1.5 (Hips) ×3 IMPLANT
HOLDER FOLEY CATH W/STRAP (MISCELLANEOUS) ×3 IMPLANT
KIT TURNOVER KIT A (KITS) IMPLANT
LINER NEUTRAL 52X36MM PLUS 4 (Liner) ×3 IMPLANT
PACK ANTERIOR HIP CUSTOM (KITS) ×3 IMPLANT
SCREW 6.5MMX25MM (Screw) ×3 IMPLANT
STEM FEM ACTIS STD SZ2 (Stem) ×3 IMPLANT
SUT MNCRL AB 4-0 PS2 18 (SUTURE) ×3 IMPLANT
SUT STRATAFIX 0 PDS 27 VIOLET (SUTURE) ×3
SUT VIC AB 1 CT1 36 (SUTURE) ×9 IMPLANT
SUT VIC AB 2-0 CT1 27 (SUTURE) ×4
SUT VIC AB 2-0 CT1 TAPERPNT 27 (SUTURE) ×2 IMPLANT
SUTURE STRATFX 0 PDS 27 VIOLET (SUTURE) ×1 IMPLANT
TRAY FOLEY MTR SLVR 16FR STAT (SET/KITS/TRAYS/PACK) IMPLANT
WATER STERILE IRR 1000ML POUR (IV SOLUTION) ×3 IMPLANT
YANKAUER SUCT BULB TIP 10FT TU (MISCELLANEOUS) IMPLANT

## 2019-01-10 NOTE — Anesthesia Preprocedure Evaluation (Signed)
Anesthesia Evaluation  Patient identified by MRN, date of birth, ID band Patient awake    Reviewed: Allergy & Precautions, H&P , NPO status , Patient's Chart, lab work & pertinent test results  Airway Mallampati: II   Neck ROM: full    Dental   Pulmonary former smoker,    breath sounds clear to auscultation       Cardiovascular negative cardio ROS   Rhythm:regular Rate:Normal     Neuro/Psych PSYCHIATRIC DISORDERS Anxiety    GI/Hepatic GERD  ,  Endo/Other  Hypothyroidism   Renal/GU      Musculoskeletal  (+) Arthritis ,   Abdominal   Peds  Hematology   Anesthesia Other Findings   Reproductive/Obstetrics                             Anesthesia Physical Anesthesia Plan  ASA: II  Anesthesia Plan: Spinal   Post-op Pain Management:    Induction: Intravenous  PONV Risk Score and Plan: 2 and Propofol infusion, Ondansetron, Treatment may vary due to age or medical condition and Midazolam  Airway Management Planned: Simple Face Mask  Additional Equipment:   Intra-op Plan:   Post-operative Plan:   Informed Consent: I have reviewed the patients History and Physical, chart, labs and discussed the procedure including the risks, benefits and alternatives for the proposed anesthesia with the patient or authorized representative who has indicated his/her understanding and acceptance.       Plan Discussed with: CRNA, Anesthesiologist and Surgeon  Anesthesia Plan Comments:         Anesthesia Quick Evaluation

## 2019-01-10 NOTE — Evaluation (Signed)
Physical Therapy Evaluation Patient Details Name: Connie Cole MRN: [id number] DOB: August 24, 1970 Today's Date: 01/10/2019   History of Present Illness  Pt s/p R THR   Clinical Impression  Pt s/p R THR and presents with decreased R LE strength/ROM and post op pain limiting functional mobility.  Pt should progress to dc home with family assist.    Follow Up Recommendations Follow surgeon's recommendation for DC plan and follow-up therapies    Equipment Recommendations  3in1 (PT)    Recommendations for Other Services       Precautions / Restrictions Precautions Precautions: Fall Restrictions Weight Bearing Restrictions: No Other Position/Activity Restrictions: WBAT      Mobility  Bed Mobility Overal bed mobility: Needs Assistance Bed Mobility: Supine to Sit     Supine to sit: Min assist     General bed mobility comments: cues for sequence and use of L LE to self assist  Transfers Overall transfer level: Needs assistance Equipment used: Rolling walker (2 wheeled) Transfers: Sit to/from Stand Sit to Stand: Min assist         General transfer comment: cues for LE management and use of UEs to self assist  Ambulation/Gait Ambulation/Gait assistance: Min assist Gait Distance (Feet): 15 Feet Assistive device: Rolling walker (2 wheeled) Gait Pattern/deviations: Step-to pattern;Decreased step length - right;Decreased step length - left;Shuffle;Trunk flexed Gait velocity: decr   General Gait Details: cues for sequence, posture and position from RW; distance limited 2* nausea/dizziness - RN aware  Stairs            Wheelchair Mobility    Modified Rankin (Stroke Patients Only)       Balance Overall balance assessment: Needs assistance Sitting-balance support: No upper extremity supported;Feet supported Sitting balance-Leahy Scale: Fair     Standing balance support: Bilateral upper extremity supported Standing balance-Leahy Scale: Poor                               Pertinent Vitals/Pain Pain Assessment: 0-10 Pain Score: 7  Pain Location: R hip/thigh Pain Descriptors / Indicators: Aching;Burning;Sore Pain Intervention(s): Limited activity within patient's tolerance;Monitored during session;Premedicated before session;Ice applied    Home Living Family/patient expects to be discharged to:: Private residence Living Arrangements: Spouse/significant other;Children Available Help at Discharge: Family Type of Home: House Home Access: Stairs to enter   Technical brewer of Steps: 4 Home Layout: One level Home Equipment: Environmental consultant - 2 wheels      Prior Function Level of Independence: Independent               Hand Dominance        Extremity/Trunk Assessment   Upper Extremity Assessment Upper Extremity Assessment: Overall WFL for tasks assessed    Lower Extremity Assessment Lower Extremity Assessment: RLE deficits/detail    Cervical / Trunk Assessment Cervical / Trunk Assessment: Normal  Communication   Communication: No difficulties  Cognition Arousal/Alertness: Awake/alert Behavior During Therapy: WFL for tasks assessed/performed Overall Cognitive Status: Within Functional Limits for tasks assessed                                        General Comments      Exercises     Assessment/Plan    PT Assessment Patient needs continued PT services  PT Problem List Decreased strength;Decreased range of motion;Decreased activity tolerance;Decreased mobility;Pain;Decreased knowledge of use of  DME       PT Treatment Interventions DME instruction;Gait training;Stair training;Functional mobility training;Therapeutic activities;Therapeutic exercise;Patient/family education    PT Goals (Current goals can be found in the Care Plan section)  Acute Rehab PT Goals Patient Stated Goal: regain IND PT Goal Formulation: With patient Time For Goal Achievement: 01/17/19 Potential to Achieve Goals:  Good    Frequency 7X/week   Barriers to discharge        Co-evaluation               AM-PAC PT "6 Clicks" Mobility  Outcome Measure Help needed turning from your back to your side while in a flat bed without using bedrails?: A Little Help needed moving from lying on your back to sitting on the side of a flat bed without using bedrails?: A Little Help needed moving to and from a bed to a chair (including a wheelchair)?: A Little Help needed standing up from a chair using your arms (e.g., wheelchair or bedside chair)?: A Little Help needed to walk in hospital room?: A Little Help needed climbing 3-5 steps with a railing? : A Lot 6 Click Score: 17    End of Session Equipment Utilized During Treatment: Gait belt Activity Tolerance: Patient limited by fatigue;Patient limited by pain Patient left: in chair;with call bell/phone within reach;with chair alarm set Nurse Communication: Mobility status PT Visit Diagnosis: Difficulty in walking, not elsewhere classified (R26.2)    Time: 1705-1750 PT Time Calculation (min) (ACUTE ONLY): 45 min   Charges:   PT Evaluation $PT Eval Low Complexity: 1 Low PT Treatments $Gait Training: 8-22 mins $Therapeutic Activity: 8-22 mins        Debe Coder PT Acute Rehabilitation Services Pager [phone] Office [phone]   Connie Cole 01/10/2019, 6:11 PM

## 2019-01-10 NOTE — Anesthesia Procedure Notes (Signed)
Procedure Name: MAC Date/Time: 01/10/2019 9:50 AM Performed by: West Pugh, CRNA Pre-anesthesia Checklist: Patient identified, Emergency Drugs available, Suction available, Patient being monitored and Timeout performed Patient Re-evaluated:Patient Re-evaluated prior to induction Oxygen Delivery Method: Simple face mask Preoxygenation: Pre-oxygenation with 100% oxygen Placement Confirmation: positive ETCO2 Dental Injury: Teeth and Oropharynx as per pre-operative assessment

## 2019-01-10 NOTE — Op Note (Signed)
NAME:  Connie Cole                ACCOUNT NO.: [id number]      MEDICAL RECORD NO.: [id number]      FACILITY:  [facility]      PHYSICIAN:  Mauri Pole  DATE OF BIRTH:  Feb 19, 1971     DATE OF PROCEDURE:  01/10/2019                                 OPERATIVE REPORT         PREOPERATIVE DIAGNOSIS: Right  hip osteoarthritis.      POSTOPERATIVE DIAGNOSIS:  Right hip osteoarthritis.      PROCEDURE:  Right total hip replacement through an anterior approach   utilizing DePuy THR system, component size 43mm pinnacle cup, a size 36+4 neutral   Altrex liner, a size 2 standard Actis stem with a 36+1.5 delta ceramic   ball.      SURGEON:  Pietro Cassis. Alvan Dame, M.D.      ASSISTANT:  Griffith Citron, PA-C     ANESTHESIA:  Spinal.      SPECIMENS:  None.      COMPLICATIONS:  None.      BLOOD LOSS:  450 cc     DRAINS:  None.      INDICATION OF THE PROCEDURE:  Connie Cole is a 48 y.o. female who had   presented to office for evaluation of right hip pain.  Radiographs revealed   progressive degenerative changes with bone-on-bone   articulation of the  hip joint, including subchondral cystic changes and osteophytes.  The patient had painful limited range of   motion significantly affecting their overall quality of life and function.  The patient was failing to    respond to conservative measures including medications and/or injections and activity modification and at this point was ready   to proceed with more definitive measures.  Consent was obtained for   benefit of pain relief.  Specific risks of infection, DVT, component   failure, dislocation, neurovascular injury, and need for revision surgery were reviewed in the office as well discussion of   the anterior versus posterior approach were reviewed.     PROCEDURE IN DETAIL:  The patient was brought to operative theater.   Once adequate anesthesia, preoperative antibiotics, 2 gm of Ancef, 1 gm of Tranexamic Acid, and  10 mg of Decadron were administered, the patient was positioned supine on the [employer] table.  Once the patient was safely positioned with adequate padding of boney prominences we predraped out the hip, and used fluoroscopy to confirm orientation of the pelvis.      The right hip was then prepped and draped from proximal iliac crest to   mid thigh with a shower curtain technique.      Time-out was performed identifying the patient, planned procedure, and the appropriate extremity.     An incision was then made 2 cm lateral to the   anterior superior iliac spine extending over the orientation of the   tensor fascia lata muscle and sharp dissection was carried down to the   fascia of the muscle.      The fascia was then incised.  The muscle belly was identified and swept   laterally and retractor placed along the superior neck.  Following   cauterization of the circumflex vessels and removing some pericapsular  fat, a second cobra retractor was placed on the inferior neck.  A T-capsulotomy was made along the line of the   superior neck to the trochanteric fossa, then extended proximally and   distally.  Tag sutures were placed and the retractors were then placed   intracapsular.  We then identified the trochanteric fossa and   orientation of my neck cut and then made a neck osteotomy with the femur on traction.  The femoral   head was removed without difficulty or complication.  Traction was let   off and retractors were placed posterior and anterior around the   acetabulum.      The labrum and foveal tissue were debrided.  I began reaming with a 45 mm   reamer and reamed up to 51 mm reamer with good bony bed preparation and a 52 mm  cup was chosen.  The final 52 mm Pinnacle cup was then impacted under fluoroscopy to confirm the depth of penetration and orientation with respect to   Abduction and forward flexion.  A screw was placed into the ilium followed by the hole eliminator.  The  final   36+4 neutral Altrex liner was impacted with good visualized rim fit.  The cup was positioned anatomically within the acetabular portion of the pelvis.      At this point, the femur was rolled to 100 degrees.  Further capsule was   released off the inferior aspect of the femoral neck.  I then   released the superior capsule proximally.  With the leg in a neutral position the hook was placed laterally   along the femur under the vastus lateralis origin and elevated manually and then held in position using the hook attachment on the bed.  The leg was then extended and adducted with the leg rolled to 100   degrees of external rotation.  Retractors were placed along the medial calcar and posteriorly over the greater trochanter.  Once the proximal femur was fully   exposed, I used a box osteotome to set orientation.  I then began   broaching with the starting chili pepper broach and passed this by hand and then broached up to 2.  With the 2 broach in place I chose a standard offset neck and did several trial reductions.  The offset was appropriate, leg lengths   appeared to be equal best matched with the +1.5 head ball trial confirmed radiographically.   Given these findings, I went ahead and dislocated the hip, repositioned all   retractors and positioned the right hip in the extended and abducted position.  The final 2 standard Actis stem was   chosen and it was impacted down to the level of neck cut.  Based on this   and the trial reductions, a final 36+1.5 delta ceramic ball was chosen and   impacted onto a clean and dry trunnion, and the hip was reduced.  The   hip had been irrigated throughout the case again at this point.  I did   reapproximate the superior capsular leaflet to the anterior leaflet   using #1 Vicryl.  The fascia of the   tensor fascia lata muscle was then reapproximated using #1 Vicryl and #0 Stratafix sutures.  The   remaining wound was closed with 2-0 Vicryl and  running 4-0 Monocryl.   The hip was cleaned, dried, and dressed sterilely using Dermabond and   Aquacel dressing.  The patient was then brought   to recovery room in  stable condition tolerating the procedure well.    Griffith Citron, PA-C was present for the entirety of the case involved from   preoperative positioning, perioperative retractor management, general   facilitation of the case, as well as primary wound closure as assistant.            Pietro Cassis Alvan Dame, M.D.        01/10/2019 11:18 AM

## 2019-01-10 NOTE — Anesthesia Procedure Notes (Signed)
Spinal  Patient location during procedure: OR Start time: 01/10/2019 9:54 AM End time: 01/10/2019 9:59 AM Reason for block: at surgeon's request Staffing Resident/CRNA: Walker, Karen L, CRNA Performed: resident/CRNA  Preanesthetic Checklist Completed: patient identified, site marked, surgical consent, pre-op evaluation, timeout performed, IV checked, risks and benefits discussed and monitors and equipment checked Spinal Block Patient position: sitting Prep: DuraPrep Patient monitoring: heart rate, continuous pulse ox and blood pressure Approach: midline Location: L3-4 Injection technique: single-shot Needle Needle type: Pencan  Needle gauge: 24 G Needle length: 9 cm Assessment Sensory level: T6 Additional Notes Expiration of kit checked and confirmed. Patient tolerated procedure well,without complications x 1 attempt with noted clear CSF. Loss of motor and sensory on exam post injection. Dr. Hodierne present for procedure.     

## 2019-01-10 NOTE — Discharge Instructions (Signed)

## 2019-01-10 NOTE — Transfer of Care (Signed)
Immediate Anesthesia Transfer of Care Note  Patient: Madalynne Gutmann  Procedure(s) Performed: TOTAL HIP ARTHROPLASTY ANTERIOR APPROACH (Right Hip)  Patient Location: PACU  Anesthesia Type:MAC and Spinal  Level of Consciousness: awake, alert , oriented and patient cooperative  Airway & Oxygen Therapy: Patient Spontanous Breathing and Patient connected to nasal cannula oxygen  Post-op Assessment: Report given to RN and Post -op Vital signs reviewed and stable  Post vital signs: Reviewed and stable  Last Vitals:  Vitals Value Taken Time  BP 92/61 01/10/19 1140  Temp    Pulse 83 01/10/19 1142  Resp 11 01/10/19 1142  SpO2 100 % 01/10/19 1142  Vitals shown include unvalidated device data.  Last Pain:  Vitals:   01/10/19 0738  TempSrc:   PainSc: 8       Patients Stated Pain Goal: 5 (16/55/37 4827)  Complications: No apparent anesthesia complications

## 2019-01-10 NOTE — Interval H&P Note (Signed)
History and Physical Interval Note:  01/10/2019 8:45 AM  Connie Cole  has presented today for surgery, with the diagnosis of Right hip osteoarthritis.  The various methods of treatment have been discussed with the patient and family. After consideration of risks, benefits and other options for treatment, the patient has consented to  Procedure(s) with comments: TOTAL HIP ARTHROPLASTY ANTERIOR APPROACH (Right) - 70 mins as a surgical intervention.  The patient's history has been reviewed, patient examined, no change in status, stable for surgery.  I have reviewed the patient's chart and labs.  Questions were answered to the patient's satisfaction.     Mauri Pole

## 2019-01-11 DIAGNOSIS — E663 Overweight: Secondary | ICD-10-CM | POA: Diagnosis present

## 2019-01-11 DIAGNOSIS — Z6829 Body mass index (BMI) 29.0-29.9, adult: Secondary | ICD-10-CM | POA: Diagnosis present

## 2019-01-11 DIAGNOSIS — Z6828 Body mass index (BMI) 28.0-28.9, adult: Secondary | ICD-10-CM | POA: Diagnosis present

## 2019-01-11 DIAGNOSIS — E876 Hypokalemia: Secondary | ICD-10-CM

## 2019-01-11 DIAGNOSIS — Z6827 Body mass index (BMI) 27.0-27.9, adult: Secondary | ICD-10-CM | POA: Diagnosis present

## 2019-01-11 LAB — BASIC METABOLIC PANEL
Anion gap: 7 (ref 5–15)
BUN: 7 mg/dL (ref 6–20)
CO2: 25 mmol/L (ref 22–32)
Calcium: 8.1 mg/dL — ABNORMAL LOW (ref 8.9–10.3)
Chloride: 101 mmol/L (ref 98–111)
Creatinine, Ser: 0.45 mg/dL (ref 0.44–1.00)
GFR calc Af Amer: >60 mL/min (ref >60)
GFR calc non Af Amer: >60 mL/min (ref >60)
Glucose, Bld: 134 mg/dL — ABNORMAL HIGH (ref 70–99)
Potassium: 3.1 mmol/L — ABNORMAL LOW (ref 3.5–5.1)
Sodium: 133 mmol/L — ABNORMAL LOW (ref 135–145)

## 2019-01-11 LAB — CBC
HCT: 30.7 % — ABNORMAL LOW (ref 36.0–46.0)
Hemoglobin: 9.9 g/dL — ABNORMAL LOW (ref 12.0–15.0)
MCH: 34.1 pg — ABNORMAL HIGH (ref 26.0–34.0)
MCHC: 32.2 g/dL (ref 30.0–36.0)
MCV: 105.9 fL — ABNORMAL HIGH (ref 80.0–100.0)
Platelets: 246 10*3/uL (ref 150–400)
RBC: 2.9 MIL/uL — ABNORMAL LOW (ref 3.87–5.11)
RDW: 11.6 % (ref 11.5–15.5)
WBC: 6.4 10*3/uL (ref 4.0–10.5)
nRBC: 0 % (ref 0.0–0.2)

## 2019-01-11 MED ORDER — ACETAMINOPHEN 500 MG PO TABS: 1000.0000 mg | ORAL_TABLET | Freq: Three times a day (TID) | ORAL | 0 refills | Status: DC

## 2019-01-11 MED ORDER — OXYCODONE HCL 5 MG PO TABS
5.0000 mg | ORAL_TABLET | ORAL | Status: DC
  Administered 2019-01-11: 10 mg via ORAL
  Filled 2019-01-11: qty 2

## 2019-01-11 MED ORDER — OXYCODONE HCL 5 MG PO TABS: 5.0000 mg | ORAL_TABLET | ORAL | 0 refills | Status: DC | PRN

## 2019-01-11 MED ORDER — POLYETHYLENE GLYCOL 3350 17 G PO PACK: 17.0000 g | PACK | Freq: Two times a day (BID) | ORAL | 0 refills | Status: DC

## 2019-01-11 MED ORDER — ACETAMINOPHEN 500 MG PO TABS: 1000.0000 mg | ORAL_TABLET | Freq: Three times a day (TID) | ORAL | Status: DC

## 2019-01-11 MED ORDER — POTASSIUM CHLORIDE CRYS ER 20 MEQ PO TBCR
40.0000 meq | EXTENDED_RELEASE_TABLET | Freq: Once | ORAL | Status: AC
  Administered 2019-01-11: 09:00:00 40 meq via ORAL
  Filled 2019-01-11: qty 2

## 2019-01-11 MED ORDER — CYCLOBENZAPRINE HCL 10 MG PO TABS: 10.0000 mg | ORAL_TABLET | Freq: Three times a day (TID) | ORAL | 0 refills | Status: DC | PRN

## 2019-01-11 MED ORDER — ASPIRIN 81 MG PO CHEW: 81.0000 mg | CHEWABLE_TABLET | Freq: Two times a day (BID) | ORAL | 0 refills | Status: AC

## 2019-01-11 MED ORDER — FERROUS SULFATE 325 (65 FE) MG PO TABS: 325.0000 mg | ORAL_TABLET | Freq: Three times a day (TID) | ORAL | 0 refills | Status: DC

## 2019-01-11 MED ORDER — DOCUSATE SODIUM 100 MG PO CAPS: 100.0000 mg | ORAL_CAPSULE | Freq: Two times a day (BID) | ORAL | 0 refills | Status: DC

## 2019-01-11 NOTE — Progress Notes (Addendum)
Physical Therapy Progress Note  Clinical Impression: Pt seen for LE strengthening therex. PT provided HEP handout and reviewed in full with pt. PT provided pt education re: ice, generalized walking program and car transfers with demonstration. Pt would continue to benefit from skilled physical therapy services at this time while admitted and after d/c to address the below listed limitations in order to improve overall safety and independence with functional mobility.  Sherie Don, [place], DPT  Acute Rehabilitation Services Pager [phone] Office [phone]    01/11/19 1000  PT Visit Information  Last PT Received On 01/11/19  Assistance Needed +1  History of Present Illness Pt is a 48 y/o female s/p R THR-direct anterior. No pertinent PMH.  Precautions  Precautions Fall  Restrictions  Weight Bearing Restrictions Yes  RLE Weight Bearing WBAT  Pain Assessment  Pain Assessment 0-10  Pain Score 8  Pain Location R hip/thigh  Pain Descriptors / Indicators Aching;Burning;Sore  Pain Intervention(s) Monitored during session;Repositioned  Cognition  Arousal/Alertness Awake/alert  Behavior During Therapy WFL for tasks assessed/performed  Overall Cognitive Status Within Functional Limits for tasks assessed  Exercises  Exercises Total Joint  Total Joint Exercises  Ankle Circles/Pumps AROM;Both;10 reps;Supine  Quad Sets AROM;Strengthening;Right;10 reps;Supine  Gluteal Sets AROM;Strengthening;Both;10 reps;Supine  Short Arc Quad AROM;Right;Strengthening;10 reps;Supine  Heel Slides AAROM;Right;Supine;Other reps (comment);Limitations;Other (comment)  Hip ABduction/ADduction AAROM;Right;10 reps;Supine  Straight Leg Raises AAROM;Right;10 reps;Supine  Heel Slides Limitations pt limited to 7 reps secondary to increasing pain  PT - End of Session  Activity Tolerance Patient tolerated treatment well  Patient left in bed;with call bell/phone within reach;Other (comment) (RN present)  Nurse  Communication Mobility status   PT - Assessment/Plan  PT Plan Current plan remains appropriate  PT Visit Diagnosis Difficulty in walking, not elsewhere classified (R26.2)  PT Frequency (ACUTE ONLY) 7X/week  Follow Up Recommendations Supervision for mobility/OOB;Other (comment) (home with HEP)  PT equipment 3in1 (PT)  AM-PAC PT "6 Clicks" Mobility Outcome Measure (Version 2)  Help needed turning from your back to your side while in a flat bed without using bedrails? 3  Help needed moving from lying on your back to sitting on the side of a flat bed without using bedrails? 3  Help needed moving to and from a bed to a chair (including a wheelchair)? 3  Help needed standing up from a chair using your arms (e.g., wheelchair or bedside chair)? 3  Help needed to walk in hospital room? 3  Help needed climbing 3-5 steps with a railing?  2  6 Click Score 17  Consider Recommendation of Discharge To: Home with [facility]  PT Goal Progression  Progress towards PT goals Progressing toward goals  Acute Rehab PT Goals  PT Goal Formulation With patient  Time For Goal Achievement 01/17/19  Potential to Achieve Goals Good  PT Time Calculation  PT Start Time (ACUTE ONLY) 0806  PT Stop Time (ACUTE ONLY) 5366  PT Time Calculation (min) (ACUTE ONLY) 16 min  PT General Charges  $$ ACUTE PT VISIT 1 Visit  PT Treatments  $Therapeutic Exercise 8-22 mins

## 2019-01-11 NOTE — Progress Notes (Signed)
Physical Therapy Treatment Patient Details Name: Connie Cole MRN: [id number] DOB: 1970/11/27 Today's Date: 01/11/2019    History of Present Illness Pt is a 47 y/o female s/p R THR-direct anterior. No pertinent PMH.    PT Comments    POD # 1  Pt OOB in recliner.  Pt reports her pain is better.  Had a rough night.  Eager to go home.  Assisted with mobility.  General transfer comment: increased time, ggod cognition and use of hands to steady self.  General Gait Details: tolerated an increased functional distance. Practiced stairs.  General stair comments: 50% VC's on proper tech and assist up forward with assist in back supporting walker.  Then returned to room to perform some TE's following HEP handout.  Instructed on proper tech, freq as well as use of ICE.  Addressed all mobility questions, discussed appropriate activity, educated on use of ICE.  Pt ready for D/C to home.    Follow Up Recommendations  Supervision for mobility/OOB;Other (comment)     Equipment Recommendations  3in1 (PT)    Recommendations for Other Services       Precautions / Restrictions Precautions Precautions: Fall Restrictions Weight Bearing Restrictions: No RLE Weight Bearing: Weight bearing as tolerated    Mobility  Bed Mobility               General bed mobility comments: OOB in recliner  Transfers Overall transfer level: Needs assistance Equipment used: Rolling walker (2 wheeled) Transfers: Sit to/from Stand Sit to Stand: Supervision         General transfer comment: increased time, ggod cognition and use of hands to steady self  Ambulation/Gait Ambulation/Gait assistance: Supervision Gait Distance (Feet): 55 Feet Assistive device: Rolling walker (2 wheeled) Gait Pattern/deviations: Step-to pattern;Decreased step length - right;Decreased step length - left;Shuffle;Trunk flexed Gait velocity: decreased   General Gait Details: tolerated an increased functional  distance.   Stairs Stairs: Yes Stairs assistance: Min assist;Min guard Stair Management: No rails;Step to pattern;Forwards;With walker Number of Stairs: 3 General stair comments: 50% VC's on proper tech and assist up forward with assist in back supporting walker   Wheelchair Mobility    Modified Rankin (Stroke Patients Only)       Balance                                            Cognition Arousal/Alertness: Awake/alert Behavior During Therapy: WFL for tasks assessed/performed Overall Cognitive Status: Within Functional Limits for tasks assessed                                        Exercises 10 reps all standing TE's  General Comments        Pertinent Vitals/Pain Pain Assessment: 0-10 Pain Score: 4  Pain Location: R hip/thigh Pain Descriptors / Indicators: Aching;Burning;Sore Pain Intervention(s): Premedicated before session;Monitored during session;Repositioned;Ice applied    Home Living                      Prior Function            PT Goals (current goals can now be found in the care plan section) Acute Rehab PT Goals PT Goal Formulation: With patient Time For Goal Achievement: 01/17/19 Potential to Achieve Goals: Good Progress towards  PT goals: Progressing toward goals    Frequency    7X/week      PT Plan Current plan remains appropriate    Co-evaluation              AM-PAC PT "6 Clicks" Mobility   Outcome Measure  Help needed turning from your back to your side while in a flat bed without using bedrails?: A Little Help needed moving from lying on your back to sitting on the side of a flat bed without using bedrails?: A Little Help needed moving to and from a bed to a chair (including a wheelchair)?: A Little Help needed standing up from a chair using your arms (e.g., wheelchair or bedside chair)?: A Little Help needed to walk in hospital room?: A Little Help needed climbing 3-5 steps  with a railing? : A Little 6 Click Score: 18    End of Session Equipment Utilized During Treatment: Gait belt Activity Tolerance: Patient tolerated treatment well Patient left: in chair;with call bell/phone within reach Nurse Communication: Mobility status PT Visit Diagnosis: Difficulty in walking, not elsewhere classified (R26.2)     Time: 5621-3086 PT Time Calculation (min) (ACUTE ONLY): 24 min  Charges:  $Gait Training: 8-22 mins $Therapeutic Exercise: 8-22 mins                     Rica Koyanagi  PTA Acute  Rehabilitation Services Pager      [phone] Office      [phone]

## 2019-01-11 NOTE — TOC Transition Note (Signed)
Transition of Care [facility]) - CM/SW Discharge Note   Patient Details  Name: Connie Cole MRN: [id number] Date of Birth: 1971/04/03  Transition of Care [facility]) CM/SW Contact:  Lia Hopping, [place] Phone Number: 01/11/2019, 10:03 AM   Clinical Narrative:    HEP Patient has RW/ 3 in 1 ordered through [place]    Final next level of care: Home/Self Care Barriers to Discharge: No Barriers Identified   Patient Goals and CMS Choice        Discharge Placement  Home                      Discharge Plan and Services                DME Arranged: 3-N-1 DME Agency: [employer] Date DME Agency Contacted: 01/11/19 Time DME Agency Contacted: 0900 Representative spoke with at DME Agency: [place] ([place]) Interventions     Readmission Risk Interventions No flowsheet data found.

## 2019-01-11 NOTE — Anesthesia Postprocedure Evaluation (Signed)
Anesthesia Post Note  Patient: Blinda Turek  Procedure(s) Performed: TOTAL HIP ARTHROPLASTY ANTERIOR APPROACH (Right Hip)     Patient location during evaluation: PACU Anesthesia Type: Spinal Level of consciousness: oriented and awake and alert Pain management: pain level controlled Vital Signs Assessment: post-procedure vital signs reviewed and stable Respiratory status: spontaneous breathing, respiratory function stable and patient connected to nasal cannula oxygen Cardiovascular status: blood pressure returned to baseline and stable Postop Assessment: no headache, no backache and no apparent nausea or vomiting Anesthetic complications: no    Last Vitals:  Vitals:   01/11/19 0037 01/11/19 0524  BP: 115/83 101/69  Pulse: 67 74  Resp: 16 16  Temp: 36.8 C 36.9 C  SpO2: 100% 98%    Last Pain:  Vitals:   01/11/19 0630  TempSrc:   PainSc: Asleep   Pain Goal: Patients Stated Pain Goal: 4 (01/10/19 1600)                 [place]

## 2019-01-11 NOTE — Progress Notes (Addendum)
     Subjective: 1 Day Post-Op Procedure(s) (LRB): TOTAL HIP ARTHROPLASTY ANTERIOR APPROACH (Right)   Patient reports pain as moderate/severe.  A few incidents with her foley yesterday and ntoa bel to get much sleep, but otherwise no events.  Dr. Alvan Dame discussed the procedure and expectations moving forward.  Also discussed changing her medications due to [place] not fully controlling her pain. We have discussed that if her pain is under better control and she does well with PT she can d/c home.  If she is still in pain and/or doesn't work well with PT she'll stay today and hopefully go home tomorrow.      Objective:   VITALS:   Vitals:   01/11/19 0037 01/11/19 0524  BP: 115/83 101/69  Pulse: 67 74  Resp: 16 16  Temp: 98.3 F (36.8 C) 98.5 F (36.9 C)  SpO2: 100% 98%    Dorsiflexion/Plantar flexion intact Incision: dressing C/D/I No cellulitis present Compartment soft  LABS Recent Labs    01/09/19 1520 01/11/19 0311  HGB 12.9 9.9*  HCT 40.3 30.7*  WBC 4.0 6.4  PLT 313 246    Recent Labs    01/11/19 0311  NA 133*  K 3.1*  BUN 7  CREATININE 0.45  GLUCOSE 134*     Assessment/Plan: 1 Day Post-Op Procedure(s) (LRB): TOTAL HIP ARTHROPLASTY ANTERIOR APPROACH (Right) Foley cath removed already Advance diet Up with therapy D/C IV fluids Discharge home if pain is controlled and she does well with PT  Follow up in 2 weeks at [facility] Follow up with OLIN,Amaiya Scruton D in 2 weeks.  Contact information:  [facility]) [address], [place] [phone]      Overweight (BMI 25-29.9) Estimated body mass index is 25.6 kg/m as calculated from the following:   Height as of this encounter: 5\' 3"  (1.6 m).   Weight as of this encounter: 65.5 kg. Patient also counseled that weight may inhibit the healing process Patient counseled that losing weight will help with future  health issues  Hypokalemia Treated with oral potassium and will observe      [place]. Korena Nass   PAC  01/11/2019, 9:02 AM

## 2019-01-11 NOTE — Plan of Care (Signed)
  Problem: Education: Goal: Knowledge of General Education information will improve Description: Including pain rating scale, medication(s)/side effects and non-pharmacologic comfort measures Outcome: Adequate for Discharge   Problem: Health Behavior/Discharge Planning: Goal: Ability to manage health-related needs will improve Outcome: Adequate for Discharge   Problem: Clinical Measurements: Goal: Ability to maintain clinical measurements within normal limits will improve Outcome: Adequate for Discharge Goal: Will remain free from infection Outcome: Adequate for Discharge Goal: Diagnostic test results will improve Outcome: Adequate for Discharge Goal: Respiratory complications will improve Outcome: Adequate for Discharge Goal: Cardiovascular complication will be avoided Outcome: Adequate for Discharge   Problem: Activity: Goal: Risk for activity intolerance will decrease Outcome: Adequate for Discharge   Problem: Nutrition: Goal: Adequate nutrition will be maintained Outcome: Adequate for Discharge   Problem: Coping: Goal: Level of anxiety will decrease Outcome: Adequate for Discharge   Problem: Elimination: Goal: Will not experience complications related to bowel motility Outcome: Adequate for Discharge Goal: Will not experience complications related to urinary retention Outcome: Adequate for Discharge   Problem: Pain Managment: Goal: General experience of comfort will improve Outcome: Adequate for Discharge   Problem: Safety: Goal: Ability to remain free from injury will improve Outcome: Adequate for Discharge   Problem: Skin Integrity: Goal: Risk for impaired skin integrity will decrease Outcome: Adequate for Discharge   Problem: Education: Goal: Knowledge of the prescribed therapeutic regimen will improve Outcome: Adequate for Discharge Goal: Understanding of discharge needs will improve Outcome: Adequate for Discharge Goal: Individualized Educational  Video(s) Outcome: Adequate for Discharge   Problem: Activity: Goal: Ability to avoid complications of mobility impairment will improve Outcome: Adequate for Discharge Goal: Ability to tolerate increased activity will improve Outcome: Adequate for Discharge   Problem: Clinical Measurements: Goal: Postoperative complications will be avoided or minimized Outcome: Adequate for Discharge   Problem: Pain Management: Goal: Pain level will decrease with appropriate interventions Outcome: Adequate for Discharge   Problem: Skin Integrity: Goal: Will show signs of wound healing Outcome: Adequate for Discharge  Home with husband today. Discharge teaching done. Written information given.

## 2019-01-14 ENCOUNTER — Encounter (HOSPITAL_COMMUNITY): Payer: Self-pay | Admitting: Orthopedic Surgery

## 2019-01-15 NOTE — Discharge Summary (Signed)
Physician Discharge Summary  Patient ID: Connie Cole MRN: [id number] DOB/AGE: 11/27/1970 48 y.o.  Admit date: 01/10/2019 Discharge date: 01/11/2019   Procedures:  Procedure(s) (LRB): TOTAL HIP ARTHROPLASTY ANTERIOR APPROACH (Right)  Attending Physician:  Dr. Paralee Cancel   Admission Diagnoses:   Right hip primary OA / pain  Discharge Diagnoses:  Principal Problem:   S/P right THA, AA Active Problems:   Overweight (BMI 25.0-29.9)   Hypokalemia  Past Medical History:  Diagnosis Date  . Arthritis    hands, hips  . GERD (gastroesophageal reflux disease)    occasional diet controlled- no med  . Palpitations    wore heart monitor x 1 wk, unknown - stress related    HPI:    Connie Cole, 48 y.o. female, has a history of pain and functional disability in the right hip(s) due to arthritis and patient has failed non-surgical conservative treatments for greater than 12 weeks to include NSAID's and/or analgesics, corticosteriod injections, activity modification and bone marrow injection.  Onset of symptoms was gradual starting 8-10 years ago with gradually worsening course since that time.The patient noted no past surgery on the right hip(s).  Patient currently rates pain in the right hip at 10 out of 10 with activity. Patient has night pain, worsening of pain with activity and weight bearing, trendelenberg gait, pain that interfers with activities of daily living and pain with passive range of motion. Patient has evidence of periarticular osteophytes and joint space narrowing by imaging studies. This condition presents safety issues increasing the risk of falls. There is no current active infection.  Risks, benefits and expectations were discussed with the patient.  Risks including but not limited to the risk of anesthesia, blood clots, nerve damage, blood vessel damage, failure of the prosthesis, infection and up to and including death.  Patient understand the risks, benefits and  expectations and wishes to proceed with surgery.   PCP: [name], DO   Discharged Condition: good  Hospital Course:  Patient underwent the above stated procedure on 01/10/2019. Patient tolerated the procedure well and brought to the recovery room in good condition and subsequently to the floor.  POD #1 BP: 101/69 ; Pulse: 74 ; Temp: 98.5 F (36.9 C) ; Resp: 16 Patient reports pain as moderate/severe.  A few incidents with her foley yesterday and ntoa bel to get much sleep, but otherwise no events.  Dr. Alvan Dame discussed the procedure and expectations moving forward.  Also discussed changing her medications due to [place] not fully controlling her pain. We have discussed that if her pain is under better control and she does well with PT she can d/c home. Dorsiflexion/plantar flexion intact, incision: dressing C/D/I, no cellulitis present and compartment soft.   LABS  Basename    HGB     9.9  HCT     30.7    Discharge Exam: General appearance: alert, cooperative and no distress Extremities: Homans sign is negative, no sign of DVT, no edema, redness or tenderness in the calves or thighs and no ulcers, gangrene or trophic changes  Disposition:  Home with follow up in 2 weeks   Follow-up Information    Paralee Cancel, MD. Schedule an appointment as soon as possible for a visit in 2 weeks.   Specialty: Orthopedic Surgery Contact information: [address] [place] [address] [phone]           Discharge Instructions    Call MD / Call 911   Complete by: As directed  If you experience chest pain or shortness of breath, CALL 911 and be transported to the hospital emergency room.  If you develope a fever above 101 F, pus (white drainage) or increased drainage or redness at the wound, or calf pain, call your surgeon's office.   Change dressing   Complete by: As directed    Maintain surgical dressing until follow up in the clinic. If the edges start to pull  up, may reinforce with tape. If the dressing is no longer working, may remove and cover with gauze and tape, but must keep the area dry and clean.  Call with any questions or concerns.   Constipation Prevention   Complete by: As directed    Drink plenty of fluids.  Prune juice may be helpful.  You may use a stool softener, such as Colace (over the counter) 100 mg twice a day.  Use MiraLax (over the counter) for constipation as needed.   Diet - low sodium heart healthy   Complete by: As directed    Discharge instructions   Complete by: As directed    Maintain surgical dressing until follow up in the clinic. If the edges start to pull up, may reinforce with tape. If the dressing is no longer working, may remove and cover with gauze and tape, but must keep the area dry and clean.  Follow up in 2 weeks at [facility]. Call with any questions or concerns.   Increase activity slowly as tolerated   Complete by: As directed    Weight bearing as tolerated with assist device (walker, cane, etc) as directed, use it as long as suggested by your surgeon or therapist, typically at least 4-6 weeks.   TED hose   Complete by: As directed    Use stockings (TED hose) for 2 weeks on both leg(s).  You may remove them at night for sleeping.      Allergies as of 01/11/2019      Reactions   Latex Swelling   Latex Rash      Medication List    STOP taking these medications   ibuprofen 200 MG tablet Commonly known as: ADVIL   ibuprofen 600 MG tablet Commonly known as: ADVIL   naproxen sodium 220 MG tablet Commonly known as: ALEVE     TAKE these medications   acetaminophen 500 MG tablet Commonly known as: TYLENOL Take 2 tablets (1,000 mg total) by mouth every 8 (eight) hours.   albuterol 108 (90 Base) MCG/ACT inhaler Commonly known as: VENTOLIN HFA Inhale 1-2 puffs into the lungs every 6 (six) hours as needed for wheezing or shortness of breath.   amitriptyline 25 MG tablet Commonly  known as: ELAVIL Take 1 tablet (25 mg total) by mouth at bedtime.   aspirin 81 MG chewable tablet Commonly known as: Aspirin Childrens Chew 1 tablet (81 mg total) by mouth 2 (two) times daily. Take for 4 weeks, then resume regular dose.   BLACK CHERRY CONCENTRATE PO Take 1,400 mg by mouth daily.   busPIRone 15 MG tablet Commonly known as: BUSPAR Take 1 tablet (15 mg total) by mouth 2 (two) times daily. What changed:   how much to take  when to take this  reasons to take this   COLLAGEN PO Take 1 capsule by mouth daily. Ultimate Collagen   cyclobenzaprine 10 MG tablet Commonly known as: FLEXERIL Take 1 tablet (10 mg total) by mouth 3 (three) times daily as needed for muscle spasms.   docusate sodium 100  MG capsule Commonly known as: Colace Take 1 capsule (100 mg total) by mouth 2 (two) times daily.   ferrous sulfate 325 (65 FE) MG tablet Commonly known as: FerrouSul Take 1 tablet (325 mg total) by mouth 3 (three) times daily with meals for 14 days.   fluticasone 50 MCG/ACT nasal spray Commonly known as: FLONASE 1 spray each nostril following sinus rinses twice daily   Krill Oil 500 MG Caps Take 500 mg by mouth daily.   oxyCODONE 5 MG immediate release tablet Commonly known as: Oxy IR/ROXICODONE Take 1-2 tablets (5-10 mg total) by mouth every 4 (four) hours as needed for moderate pain or severe pain.   polyethylene glycol 17 g packet Commonly known as: MIRALAX / GLYCOLAX Take 17 g by mouth 2 (two) times daily.   sertraline 50 MG tablet Commonly known as: ZOLOFT Take 2 tablets (100 mg total) by mouth daily.            Discharge Care Instructions  (From admission, onward)         Start     Ordered   01/11/19 0000  Change dressing    Comments: Maintain surgical dressing until follow up in the clinic. If the edges start to pull up, may reinforce with tape. If the dressing is no longer working, may remove and cover with gauze and tape, but must keep the  area dry and clean.  Call with any questions or concerns.   01/11/19 [id number]           Signed: [place]. Munira Polson   PA-C  01/15/2019, 8:18 AM

## 2019-02-28 ENCOUNTER — Telehealth: Payer: Self-pay | Admitting: Family Medicine

## 2019-02-28 DIAGNOSIS — F411 Generalized anxiety disorder: Secondary | ICD-10-CM

## 2019-02-28 MED ORDER — SERTRALINE HCL 50 MG PO TABS: 100.0000 mg | ORAL_TABLET | Freq: Every day | ORAL | 0 refills | Status: DC

## 2019-02-28 NOTE — Telephone Encounter (Signed)
Please call pt to schedule telemedicine visit prior to any further refills.  Charyl Bigger, CMA

## 2019-02-28 NOTE — Telephone Encounter (Signed)
Patient needs a refill on this RX medication and this is the pharmacy they need it sent to:    sertraline (ZOLOFT) 50 MG tablet [id number]   Order Details Dose: 100 mg Route: Oral Frequency: Daily  Dispense Quantity: 180 tablet Refills: 0 Fills remaining: --        Sig: Take 2 tablets (100 mg total) by mouth daily.        [place], [place] - [place] [phone] (Phone) [phone] (Fax)    --- forwarding to clinical pool --- AM

## 2019-03-14 DIAGNOSIS — D443 Neoplasm of uncertain behavior of pituitary gland: Secondary | ICD-10-CM | POA: Diagnosis not present

## 2019-03-14 DIAGNOSIS — E221 Hyperprolactinemia: Secondary | ICD-10-CM | POA: Diagnosis not present

## 2019-03-14 DIAGNOSIS — E039 Hypothyroidism, unspecified: Secondary | ICD-10-CM | POA: Diagnosis not present

## 2019-03-14 DIAGNOSIS — F5101 Primary insomnia: Secondary | ICD-10-CM | POA: Diagnosis not present

## 2019-03-20 ENCOUNTER — Telehealth: Payer: Self-pay | Admitting: Family Medicine

## 2019-03-20 NOTE — Telephone Encounter (Signed)
Left Patient message Rx refills pending for Telehealth / OV .  --FYI to medical assistant  --glh

## 2019-04-04 ENCOUNTER — Encounter: Payer: Self-pay | Admitting: Family Medicine

## 2019-04-04 ENCOUNTER — Ambulatory Visit (INDEPENDENT_AMBULATORY_CARE_PROVIDER_SITE_OTHER): Payer: BC Managed Care – PPO | Admitting: Family Medicine

## 2019-04-04 ENCOUNTER — Other Ambulatory Visit: Payer: Self-pay

## 2019-04-04 VITALS — Ht 63.0 in | Wt 145.0 lb

## 2019-04-04 DIAGNOSIS — F438 Other reactions to severe stress: Secondary | ICD-10-CM

## 2019-04-04 DIAGNOSIS — F430 Acute stress reaction: Secondary | ICD-10-CM

## 2019-04-04 DIAGNOSIS — G479 Sleep disorder, unspecified: Secondary | ICD-10-CM | POA: Diagnosis not present

## 2019-04-04 DIAGNOSIS — Z471 Aftercare following joint replacement surgery: Secondary | ICD-10-CM | POA: Diagnosis not present

## 2019-04-04 DIAGNOSIS — F4389 Other reactions to severe stress: Secondary | ICD-10-CM

## 2019-04-04 DIAGNOSIS — Z96641 Presence of right artificial hip joint: Secondary | ICD-10-CM | POA: Diagnosis not present

## 2019-04-04 DIAGNOSIS — F411 Generalized anxiety disorder: Secondary | ICD-10-CM

## 2019-04-04 MED ORDER — ZOLPIDEM TARTRATE 5 MG PO TABS: 5.0000 mg | ORAL_TABLET | Freq: Every evening | ORAL | 0 refills | Status: DC | PRN

## 2019-04-04 MED ORDER — SERTRALINE HCL 100 MG PO TABS: 100.0000 mg | ORAL_TABLET | Freq: Every day | ORAL | 1 refills | Status: DC

## 2019-04-04 NOTE — Progress Notes (Signed)
Telehealth office visit note for Connie Cole, D.O- at Primary Care at [facility]   I connected with current patient today and verified that I am speaking with the correct person using two identifiers.   . Location of the patient: Home . Location of the provider: Office Only the patient (+/- their family members at pt's discretion) and myself were participating in the encounter - This visit type was conducted due to national recommendations for restrictions regarding the COVID-19 Pandemic (e.g. social distancing) in an effort to limit this patient's exposure and mitigate transmission in our community.  This format is felt to be most appropriate for this patient at this time.   - The patient did not have access to video technology or had technical difficulties with video requiring transitioning to audio format only. - No physical exam could be performed with this format, beyond that communicated to [place] by the patient/ family members as noted.   - Additionally my office staff/ schedulers discussed with the patient that there may be a monetary charge related to this service, depending on their medical insurance.   The patient expressed understanding, and agreed to proceed.       History of Present Illness:  Denies concerns today.  States "I feel like things are pretty good; I continue to try to work on myself, but yeah I feel pretty good."  - Recent Hip Replacement Had a hip replacement with Dr. Alvan Dame end of July.  Notes it "was hard but it went well."  Has "healed really good."  Has a follow up today at 104 with Dr. Alvan Dame.  Has been doing physical therapy on her own at home.  Notes getting back into normal routines, "but the pain was terrible."  Says the pain meds worked, "but they just put me to sleep."  - Mood Last visit for mood was 10/24/2018.  Continues Zoloft, taking two of the 50's.  Denies S-E and confirms tolerating well.  Notes no longer taking the Buspar.  States "I  don't take it unless I absolutely have to."  - Difficulty Sleeping No longer taking Elavil for sleep.  Says "I still struggle here and there with sleep, but I went back to try the Ambien again because I had a lot of it left over, and also CBD oil."  Notes using both Ambien and CBD oil together, and "it's hit or miss, but it's better than it was before."  She does sleep through the night some, "not every single night; it still takes a while for it to kick in for me to go to bed, but I'd say it's better overall."    GAD 7 : Generalized Anxiety Score 10/24/2018 09/06/2017  Nervous, Anxious, on Edge 0 0  Control/stop worrying 0 0  Worry too much - different things 0 1  Trouble relaxing 0 0  Restless 0 1  Easily annoyed or irritable 0 0  Afraid - awful might happen 0 0  Total GAD 7 Score 0 2  Anxiety Difficulty Not difficult at all -    Depression screen [facility] 2/9 04/04/2019 01/08/2019 10/24/2018 10/10/2018 07/09/2018  Decreased Interest 0 0 0 0 0  Down, Depressed, Hopeless 0 0 0 0 0  PHQ - 2 Score 0 0 0 0 0  Altered sleeping 3 2 3 3 2   Tired, decreased energy 0 1 1 0 0  Change in appetite 0 0 0 0 0  Feeling bad or failure about yourself  0 0 0 0 0  Trouble concentrating 0 0 0 0 0  Moving slowly or fidgety/restless 0 0 0 0 0  Suicidal thoughts 0 0 0 0 0  PHQ-9 Score 3 3 4 3 2   Difficult doing work/chores Not difficult at all - Somewhat difficult - Not difficult at all  Some recent data might be hidden      Impression and Recommendations:    1. Acute reaction to situational stress   2. GAD (generalized anxiety disorder)   3. Stress-related physiological response affecting physical condition   4. Sleeping difficulty      GAD, Acute Reaction to Situational Stress, Stress-Related Physiological Response - 6 months ago followed up for mood and was told to continue Zoloft. - Stable on Zoloft at this time.  See med list.  - Per patient, "only taking Buspar when I need it," and not taking  Buspar as prescribed. - Advised patient that Buspar is meant as a daily Rx and not meant for use PRN. - Patient knows to keep clinic updated on how she is using the Buspar. - Patient knows to resume Buspar as prescribed for additional anxiety control.  - In addition to prescription intervention, reviewed the "spokes of the wheel" of mood and health management.  Stressed the importance of ongoing prudent habits, including regular exercise, appropriate sleep hygiene, healthful dietary habits, and prayer/meditation to relax.  - Will continue to monitor.  Sleeping Difficulty - Patient now taking 5mg  Ambien she had left over at home. - Per pt, states "sometimes I take two."  - Taking CBD oil in addition. - Per pt, sleep stable on Ambien & CBD oil.  - Elavil discontinued. - Extensively reviewed potential risks of chronic management of sleep on Ambien. - Discussed that long-term use of Ambien can lead to dementia and memory problems.  - Discussed that in the past, patient has tried Lunesta, trazodone, and elavil.  - Encouraged patient to work on using other methods to assist with sleep, such as meditation, CBD oil.  - Will continue to monitor.  Lifestyle & Preventative Health Maintenance - Advised patient to continue working toward exercising to improve overall mental, physical, and emotional health.    - Encouraged patient to engage in daily physical activity, especially a formal exercise routine.  Recommended that the patient eventually strive for at least 150 minutes of moderate cardiovascular activity per week according to guidelines established by the [facility].   - Healthy dietary habits encouraged, including low-carb, and high amounts of lean protein in diet.   - Patient should also consume adequate amounts of water.  - Health counseling performed.  All questions answered.  Recommendations - Due for CPE near future. - Last full labs drawn September 2019. - Patient will call in to  clinic with updated list of specialists. - Otherwise, continue to follow up for mood and chronic management every 4-6 months.  - As part of my medical decision making, I reviewed the following data within the [place] History obtained from pt /family, CMA notes reviewed and incorporated if applicable, Labs reviewed, Radiograph/ tests reviewed if applicable and OV notes from prior OV's with me, as well as other specialists she/he has seen since seeing me last, were all reviewed and used in my medical decision making process today.    - Additionally, discussion had with patient regarding our treatment plan, and their biases/concerns about that plan were used in my medical decision making today.    - The  patient agreed with the plan and demonstrated an understanding of the instructions.   No barriers to understanding were identified.    - Red flag symptoms and signs discussed in detail.  Patient expressed understanding regarding what to do in case of emergency\ urgent symptoms.   - The patient was advised to call back or seek an in-person evaluation if the symptoms worsen or if the condition fails to improve as anticipated.   Return for 4-6 months mood, CPE & FBW near future.    Meds ordered this encounter  Medications  . sertraline (ZOLOFT) 100 MG tablet    Sig: Take 1 tablet (100 mg total) by mouth daily.    Dispense:  90 tablet    Refill:  1  . zolpidem (AMBIEN) 5 MG tablet    Sig: Take 1 tablet (5 mg total) by mouth at bedtime as needed for sleep.    Dispense:  90 tablet    Refill:  0    Medications Discontinued During This Encounter  Medication Reason  . amitriptyline (ELAVIL) 25 MG tablet Change in therapy  . sertraline (ZOLOFT) 50 MG tablet Reorder     I provided 12+ minutes of non face-to-face time during this encounter.  Additional time was spent with charting and coordination of care after the actual visit commenced.   Note:  This note was prepared with  assistance of Dragon voice recognition software. Occasional wrong-word or sound-a-like substitutions may have occurred due to the inherent limitations of voice recognition software.  This document serves as a record of services personally performed by Connie Dance, DO. It was created on her behalf by Toni Amend, a trained medical scribe. The creation of this record is based on the scribe's personal observations and the provider's statements to them.   I have reviewed the above medical documentation for accuracy and completeness and I concur.  Connie Dance, DO 04/04/2019 4:44 PM       Patient Care Team    Relationship Specialty Notifications Start End  Connie Dance, DO PCP - General Family Medicine  12/10/15   Brien Few, MD Consulting Physician Obstetrics and Gynecology  05/16/17   Merrilee Seashore, MD Consulting Physician Internal Medicine  05/16/17    Comment: Actually sees Dr. Felicie Morn at [facility], [place]    07/24/18   Juanita Craver, MD Consulting Physician Gastroenterology  04/04/19      -Vitals obtained; medications/ allergies reconciled;  personal medical, social, Sx etc.histories were updated by CMA, reviewed by me and are reflected in chart   Patient Active Problem List   Diagnosis Date Noted  . Acute reaction to situational stress 05/16/2017    Priority: High  . GAD (generalized anxiety disorder) 05/16/2017    Priority: High  . Stress-related physiological response affecting physical condition 05/16/2017    Priority: Medium  . GERD (gastroesophageal reflux disease) 12/16/2015    Priority: Medium  . Tinea corporis (h/o tinea versicolor per pt) 12/20/2015    Priority: Low  . Overweight (BMI 25.0-29.9) 01/11/2019  . Hypokalemia 01/11/2019  . S/P right THA, AA 01/10/2019  . Cough 07/09/2018  . Hyperprolactinemia ([place]) 03/06/2018  . Pain in joint of right hip 12/25/2017  . Environmental and seasonal allergies 11/15/2017  .  Chronic pansinusitis 11/15/2017  . Cervical disc disorder 11/15/2017  . Muscle spasm- neck muscles 11/15/2017  . H/O: pituitary tumor 09/06/2017  . Sleeping difficulty 09/06/2017  . Subclinical hypothyroidism 09/06/2017  . Generalized osteoarthritis of multiple sites 09/06/2017  .  Numbness and tingling in both arms/ hands 05/16/2017  . Non-cardiac chest pain- assoc with emotional stress 05/16/2017  . Palpitations 01/18/2016  . Family history of stroke (cerebrovascular)- mother 73's 01/18/2016  . Family history of atrial fibrillation 01/18/2016  . Seborrheic dermatitis of scalp 12/16/2015     Current Meds  Medication Sig  . acetaminophen (TYLENOL) 500 MG tablet Take 2 tablets (1,000 mg total) by mouth every 8 (eight) hours.  Marland Kitchen albuterol (PROVENTIL HFA;VENTOLIN HFA) 108 (90 Base) MCG/ACT inhaler Inhale 1-2 puffs into the lungs every 6 (six) hours as needed for wheezing or shortness of breath.  . COLLAGEN PO Take 1 capsule by mouth daily. Ultimate Collagen  . cyclobenzaprine (FLEXERIL) 10 MG tablet Take 1 tablet (10 mg total) by mouth 3 (three) times daily as needed for muscle spasms.  Marland Kitchen docusate sodium (COLACE) 100 MG capsule Take 1 capsule (100 mg total) by mouth 2 (two) times daily.  . fluticasone (FLONASE) 50 MCG/ACT nasal spray 1 spray each nostril following sinus rinses twice daily  . Krill Oil 500 MG CAPS Take 500 mg by mouth daily.  . Misc Natural Products (BLACK CHERRY CONCENTRATE PO) Take 1,400 mg by mouth daily.  Marland Kitchen oxyCODONE (OXY IR/ROXICODONE) 5 MG immediate release tablet Take 1-2 tablets (5-10 mg total) by mouth every 4 (four) hours as needed for moderate pain or severe pain.  . polyethylene glycol (MIRALAX / GLYCOLAX) 17 g packet Take 17 g by mouth 2 (two) times daily.  . sertraline (ZOLOFT) 100 MG tablet Take 1 tablet (100 mg total) by mouth daily.  . [DISCONTINUED] sertraline (ZOLOFT) 50 MG tablet Take 2 tablets (100 mg total) by mouth daily. OFFICE VISIT REQUIRED PRIOR TO  ANY FURTHER REFILLS     Allergies:  Allergies  Allergen Reactions  . Latex Swelling  . Latex Rash     ROS:  See above HPI for pertinent positives and negatives   Objective:   Height 5\' 3"  (1.6 m), weight 145 lb (65.8 kg).  (if some vitals are omitted, this means that patient was UNABLE to obtain them even though they were asked to get them prior to OV today.  They were asked to call us at their earliest convenience with these once obtained. )  General: A & O * 3; sounds in no acute distress; in usual state of health.  Skin: Pt confirms warm and dry extremities and pink fingertips HEENT: Pt confirms lips non-cyanotic Chest: Patient confirms normal chest excursion and movement Respiratory: speaking in full sentences, no conversational dyspnea; patient confirms no use of accessory muscles Psych: insight appears good, mood- appears full

## 2019-05-14 DIAGNOSIS — Z20828 Contact with and (suspected) exposure to other viral communicable diseases: Secondary | ICD-10-CM | POA: Diagnosis not present

## 2019-06-22 IMAGING — CR DG HIP (WITH OR WITHOUT PELVIS) 2-3V*R*
2 series · 2 of 2 positions shown · non-contrast
Comparison: 12/15/2015.

CLINICAL DATA: Chronic right hip pain related to karate activity.

EXAM:
DG HIP (WITH OR WITHOUT PELVIS) 2-3V RIGHT

[w pelvis upright]
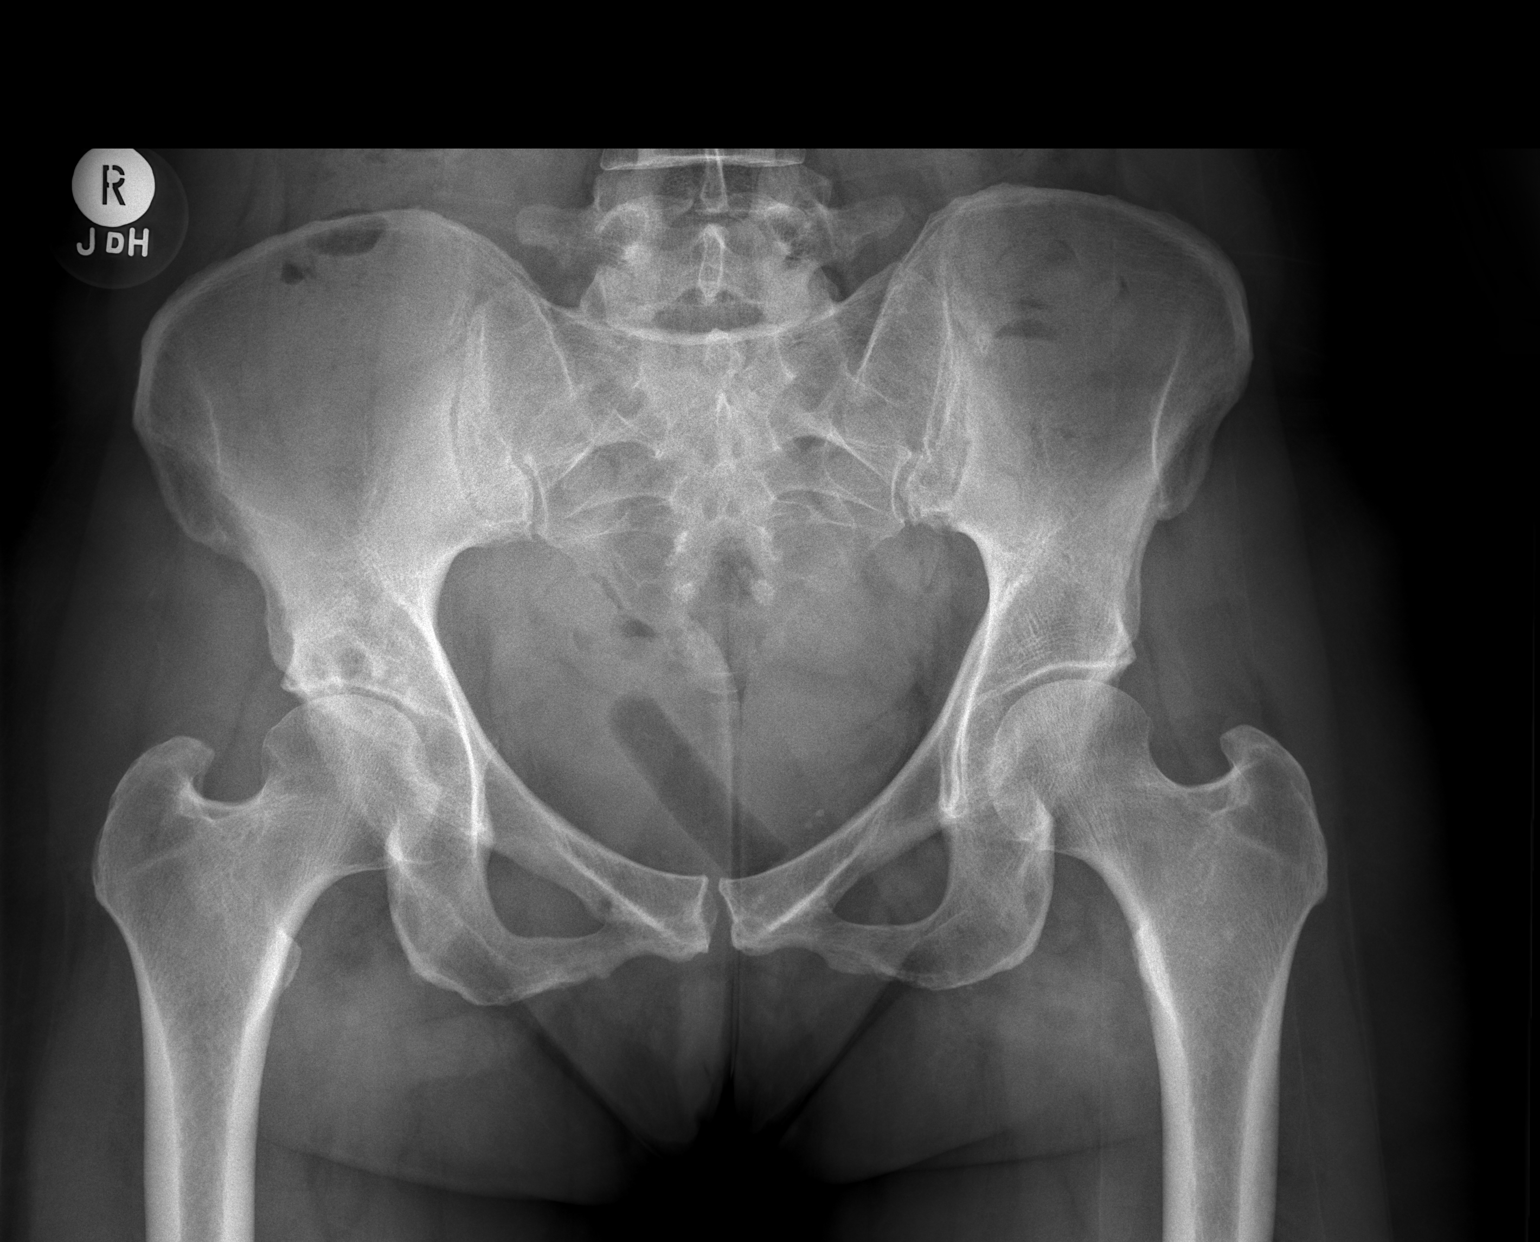

[w hip frog right]
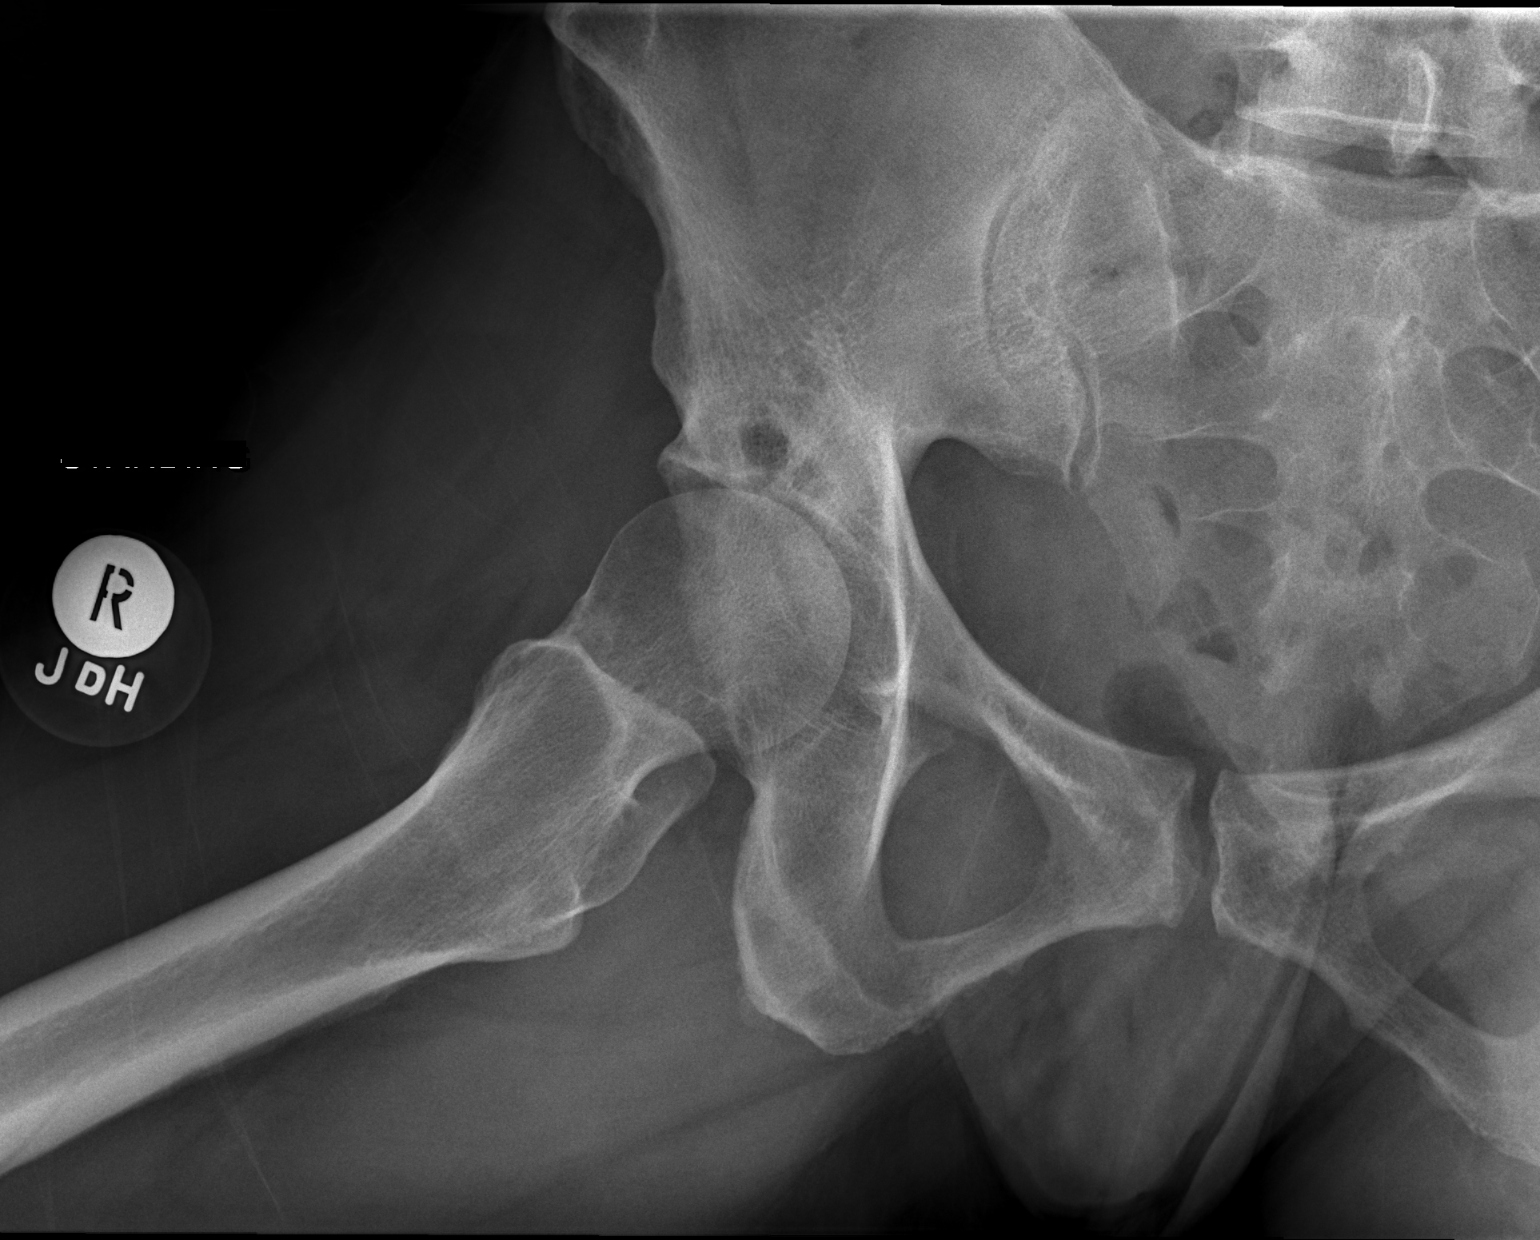

[2 of 2 positions shown; findings below may reference images not displayed]

FINDINGS: Moderate right hip joint space narrowing with subarticular cystic
changes in the acetabulum. Normal appearing left hip and lower
lumbar spine. A tampon is in place.
IMPRESSION: Moderate right hip joint degenerative changes without significant
change.

## 2019-09-11 ENCOUNTER — Other Ambulatory Visit: Payer: Self-pay | Admitting: Family Medicine

## 2019-09-11 DIAGNOSIS — F411 Generalized anxiety disorder: Secondary | ICD-10-CM

## 2019-09-11 NOTE — Telephone Encounter (Signed)
Patient called states unsure if she used her last Rx refill on :   sertraline (ZOLOFT) 100 MG tablet [id number]   Order Details Dose: 100 mg Route: Oral Frequency: Daily  Dispense Quantity: 90 tablet Refills: 1       Sig: Take 1 tablet (100 mg total) by mouth daily.   --(Advised her to check w/ [facility] & if they deny to call us back )--- forwarding FYI to med asst to be on the look out for an possible electronic request for pt.  --glh

## 2019-09-12 ENCOUNTER — Other Ambulatory Visit: Payer: Self-pay | Admitting: Family Medicine

## 2019-09-12 DIAGNOSIS — F411 Generalized anxiety disorder: Secondary | ICD-10-CM

## 2019-10-14 DIAGNOSIS — Z1231 Encounter for screening mammogram for malignant neoplasm of breast: Secondary | ICD-10-CM | POA: Diagnosis not present

## 2019-10-14 DIAGNOSIS — Z1151 Encounter for screening for human papillomavirus (HPV): Secondary | ICD-10-CM | POA: Diagnosis not present

## 2019-10-14 DIAGNOSIS — R350 Frequency of micturition: Secondary | ICD-10-CM | POA: Diagnosis not present

## 2019-10-14 DIAGNOSIS — D259 Leiomyoma of uterus, unspecified: Secondary | ICD-10-CM | POA: Diagnosis not present

## 2019-10-14 DIAGNOSIS — Z01419 Encounter for gynecological examination (general) (routine) without abnormal findings: Secondary | ICD-10-CM | POA: Diagnosis not present

## 2019-10-14 DIAGNOSIS — N920 Excessive and frequent menstruation with regular cycle: Secondary | ICD-10-CM | POA: Diagnosis not present

## 2019-10-14 LAB — HM PAP SMEAR: HM Pap smear: NORMAL

## 2019-10-14 LAB — HM MAMMOGRAPHY

## 2019-10-21 DIAGNOSIS — N920 Excessive and frequent menstruation with regular cycle: Secondary | ICD-10-CM | POA: Diagnosis not present

## 2019-10-31 DIAGNOSIS — N939 Abnormal uterine and vaginal bleeding, unspecified: Secondary | ICD-10-CM | POA: Diagnosis not present

## 2019-12-10 DIAGNOSIS — N951 Menopausal and female climacteric states: Secondary | ICD-10-CM | POA: Diagnosis not present

## 2019-12-10 DIAGNOSIS — E7800 Pure hypercholesterolemia, unspecified: Secondary | ICD-10-CM | POA: Diagnosis not present

## 2019-12-10 DIAGNOSIS — E611 Iron deficiency: Secondary | ICD-10-CM | POA: Diagnosis not present

## 2019-12-23 DIAGNOSIS — F5101 Primary insomnia: Secondary | ICD-10-CM | POA: Diagnosis not present

## 2019-12-23 DIAGNOSIS — R232 Flushing: Secondary | ICD-10-CM | POA: Diagnosis not present

## 2019-12-23 DIAGNOSIS — M2559 Pain in other specified joint: Secondary | ICD-10-CM | POA: Diagnosis not present

## 2019-12-23 DIAGNOSIS — N951 Menopausal and female climacteric states: Secondary | ICD-10-CM | POA: Diagnosis not present

## 2020-01-09 ENCOUNTER — Telehealth: Payer: Self-pay | Admitting: Physician Assistant

## 2020-01-09 DIAGNOSIS — F411 Generalized anxiety disorder: Secondary | ICD-10-CM

## 2020-01-09 DIAGNOSIS — R194 Change in bowel habit: Secondary | ICD-10-CM | POA: Diagnosis not present

## 2020-01-09 DIAGNOSIS — R159 Full incontinence of feces: Secondary | ICD-10-CM | POA: Diagnosis not present

## 2020-01-09 DIAGNOSIS — K623 Rectal prolapse: Secondary | ICD-10-CM | POA: Diagnosis not present

## 2020-01-09 DIAGNOSIS — Z86010 Personal history of colonic polyps: Secondary | ICD-10-CM | POA: Diagnosis not present

## 2020-01-09 MED ORDER — SERTRALINE HCL 100 MG PO TABS: 100.0000 mg | ORAL_TABLET | Freq: Every day | ORAL | 0 refills | Status: DC

## 2020-01-09 NOTE — Telephone Encounter (Signed)
Patient called in stating she has 13 days of Zoloft and needs a refill to hold her over until her appointment in mid august.  [facility] is where she would like it sent.

## 2020-01-09 NOTE — Addendum Note (Signed)
Addended by: Fonnie Mu on: 01/09/2020 03:38 PM   Modules accepted: Orders

## 2020-01-30 DIAGNOSIS — N951 Menopausal and female climacteric states: Secondary | ICD-10-CM | POA: Diagnosis not present

## 2020-02-03 DIAGNOSIS — N816 Rectocele: Secondary | ICD-10-CM | POA: Diagnosis not present

## 2020-02-03 DIAGNOSIS — N3942 Incontinence without sensory awareness: Secondary | ICD-10-CM | POA: Diagnosis not present

## 2020-02-04 DIAGNOSIS — R232 Flushing: Secondary | ICD-10-CM | POA: Diagnosis not present

## 2020-02-04 DIAGNOSIS — D497 Neoplasm of unspecified behavior of endocrine glands and other parts of nervous system: Secondary | ICD-10-CM | POA: Diagnosis not present

## 2020-02-04 DIAGNOSIS — N951 Menopausal and female climacteric states: Secondary | ICD-10-CM | POA: Diagnosis not present

## 2020-02-04 DIAGNOSIS — F5101 Primary insomnia: Secondary | ICD-10-CM | POA: Diagnosis not present

## 2020-02-10 ENCOUNTER — Ambulatory Visit: Payer: BC Managed Care – PPO | Admitting: Physician Assistant

## 2020-02-17 DIAGNOSIS — F331 Major depressive disorder, recurrent, moderate: Secondary | ICD-10-CM | POA: Diagnosis not present

## 2020-02-17 DIAGNOSIS — N924 Excessive bleeding in the premenopausal period: Secondary | ICD-10-CM | POA: Diagnosis not present

## 2020-02-17 DIAGNOSIS — Z2821 Immunization not carried out because of patient refusal: Secondary | ICD-10-CM | POA: Diagnosis not present

## 2020-02-17 DIAGNOSIS — F411 Generalized anxiety disorder: Secondary | ICD-10-CM | POA: Diagnosis not present

## 2020-02-17 DIAGNOSIS — G4700 Insomnia, unspecified: Secondary | ICD-10-CM | POA: Diagnosis not present

## 2020-03-27 DIAGNOSIS — M25551 Pain in right hip: Secondary | ICD-10-CM | POA: Diagnosis not present

## 2020-06-16 DIAGNOSIS — D443 Neoplasm of uncertain behavior of pituitary gland: Secondary | ICD-10-CM | POA: Diagnosis not present

## 2020-06-23 DIAGNOSIS — F5101 Primary insomnia: Secondary | ICD-10-CM | POA: Diagnosis not present

## 2020-06-23 DIAGNOSIS — E221 Hyperprolactinemia: Secondary | ICD-10-CM | POA: Diagnosis not present

## 2020-06-23 DIAGNOSIS — E039 Hypothyroidism, unspecified: Secondary | ICD-10-CM | POA: Diagnosis not present

## 2020-06-23 DIAGNOSIS — D443 Neoplasm of uncertain behavior of pituitary gland: Secondary | ICD-10-CM | POA: Diagnosis not present

## 2020-06-29 IMAGING — DX DG CHEST 2V
2 series · 2 of 2 positions shown · non-contrast
Comparison: None.

CLINICAL DATA: Persisted fever

EXAM:
CHEST - 2 VIEW

[chest pa]
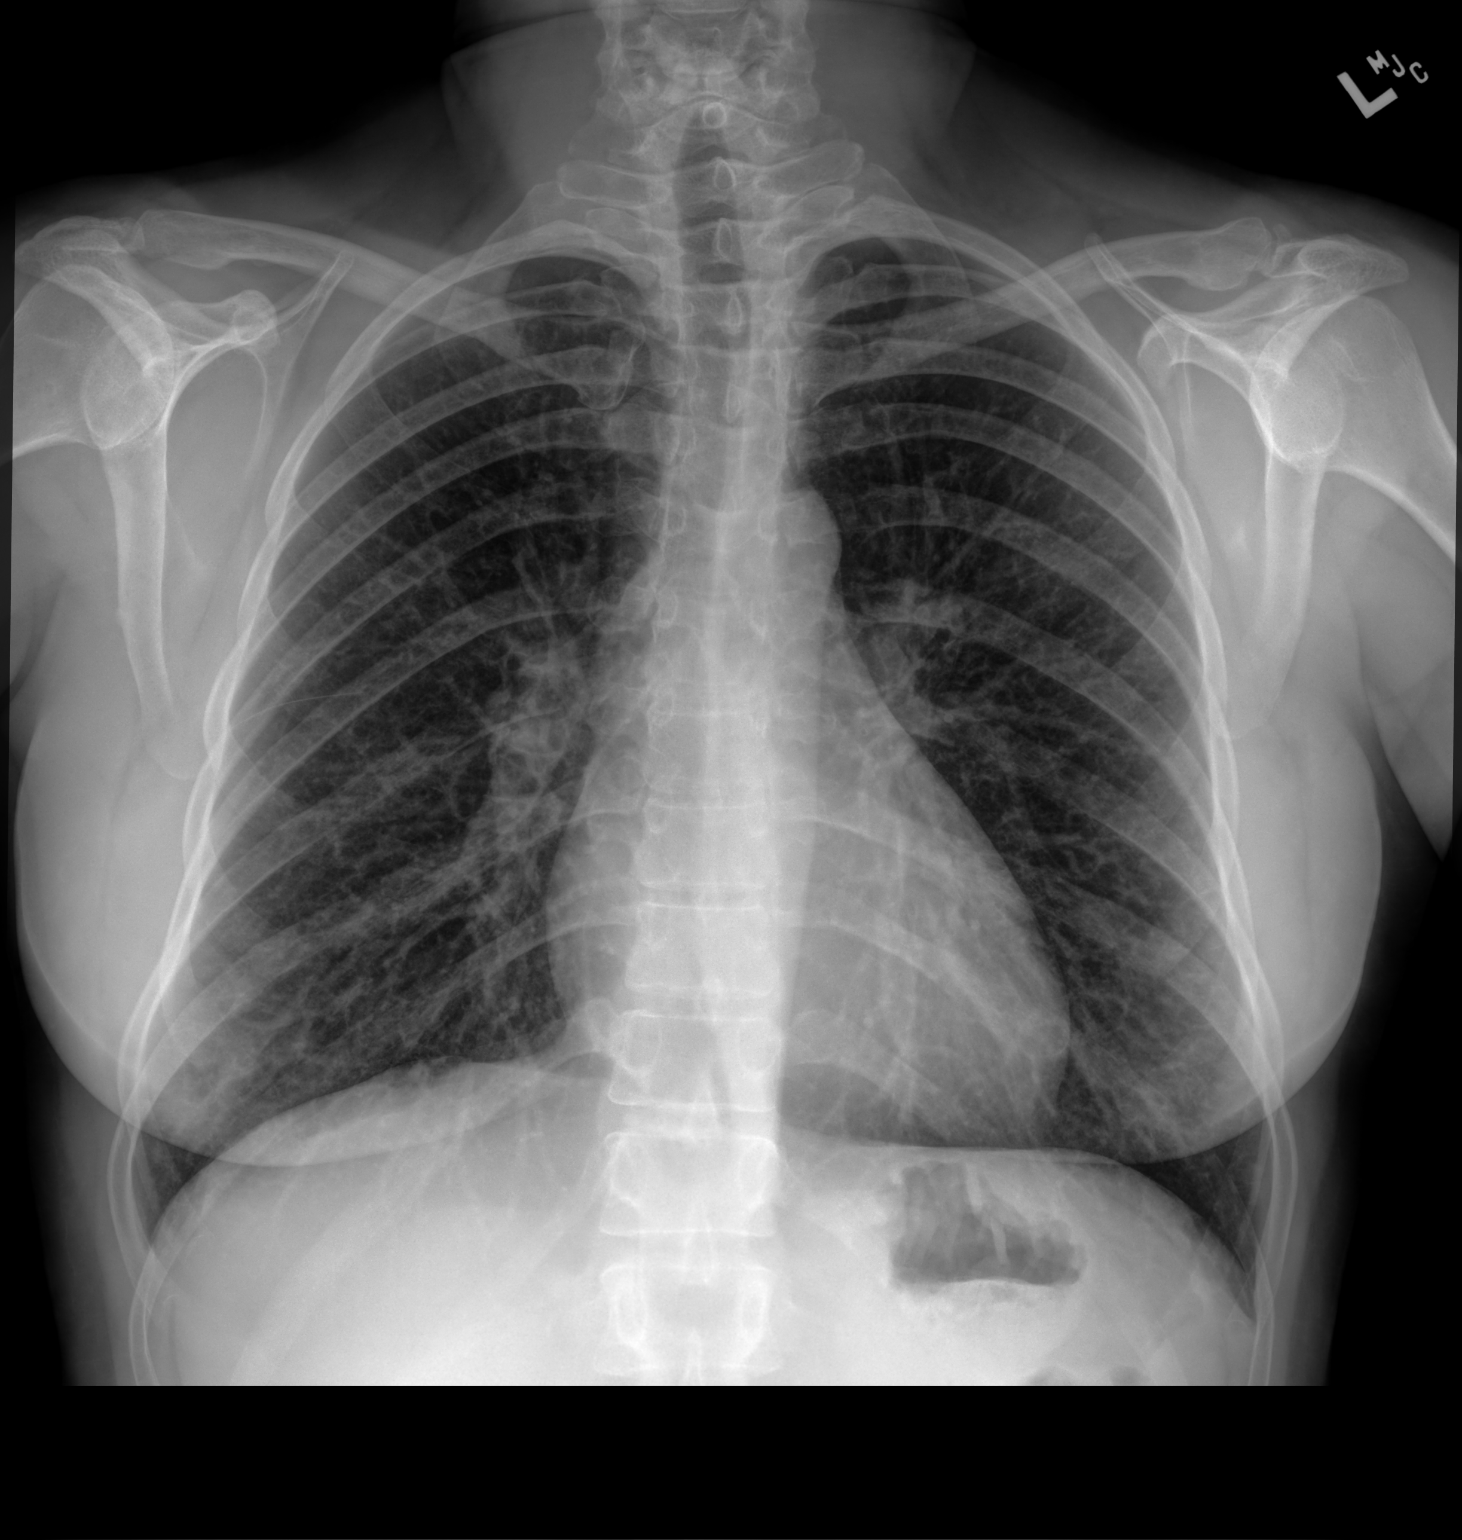

[chest lat]
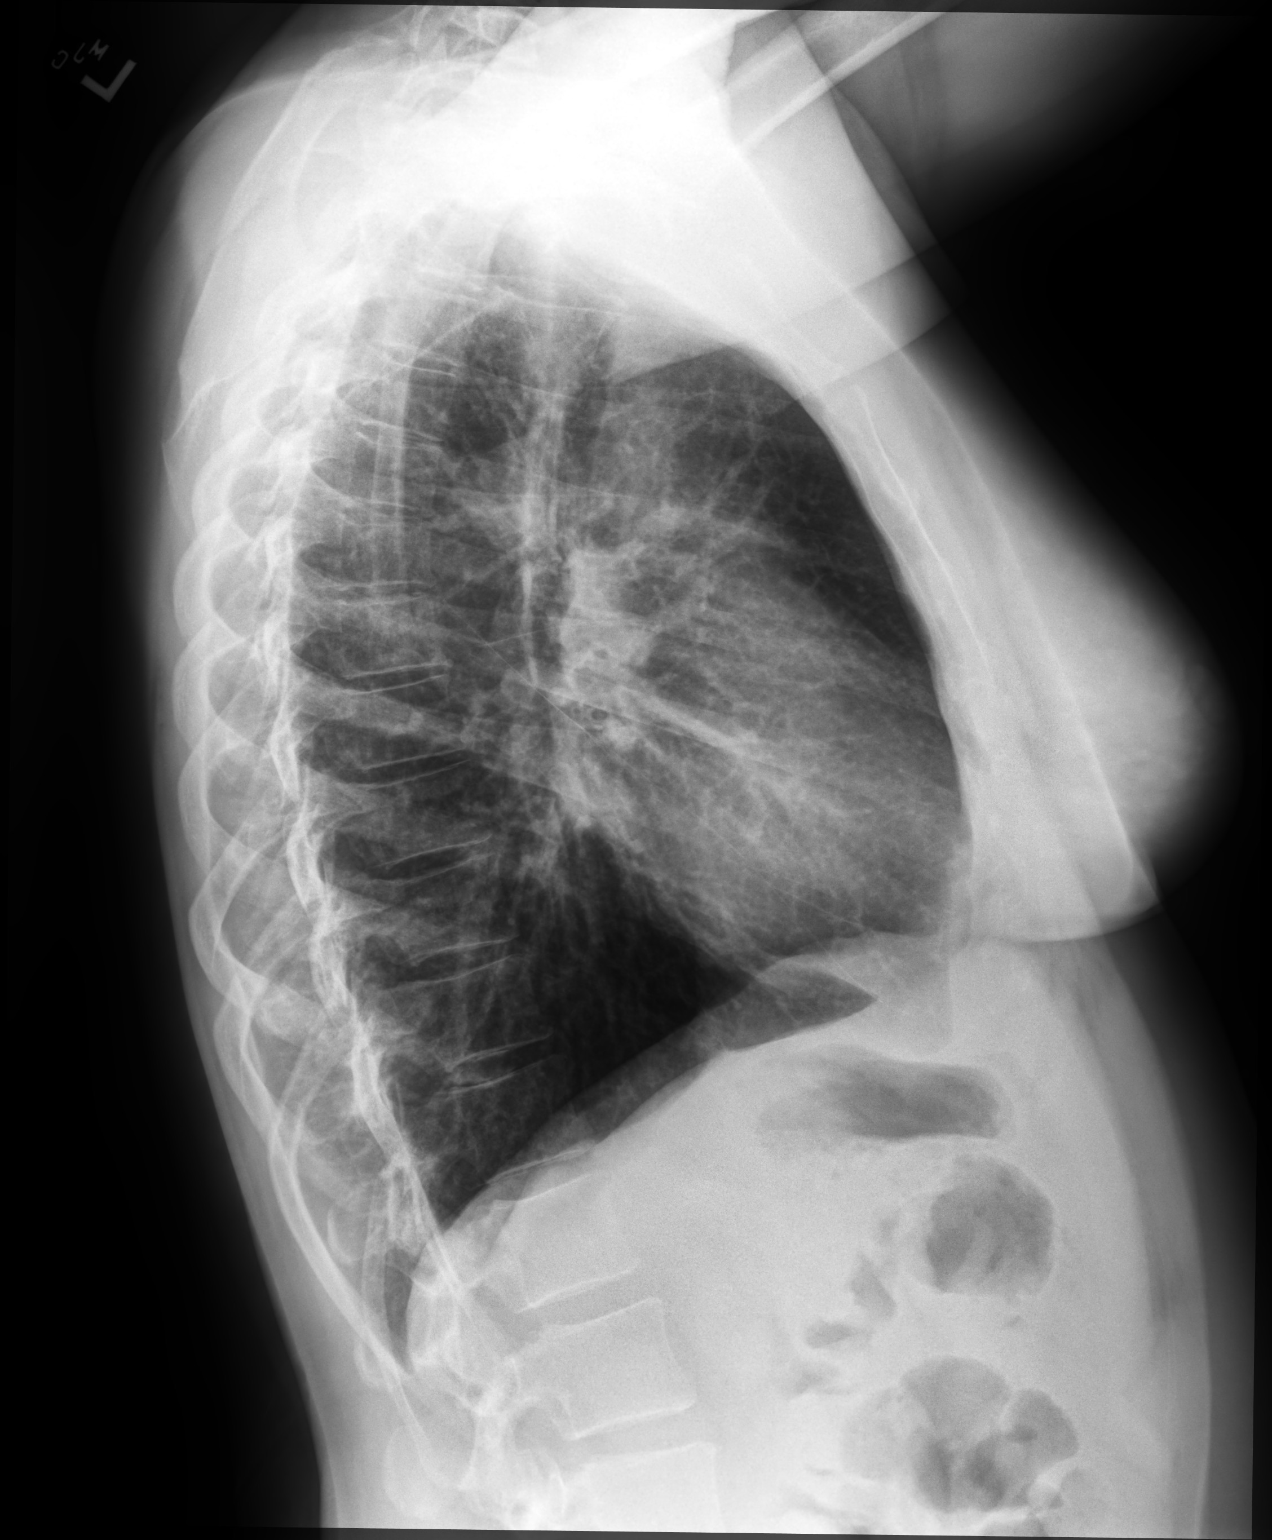

[2 of 2 positions shown; findings below may reference images not displayed]

FINDINGS: Diffuse interstitial opacity. No consolidation or effusion. Normal
heart size. No pneumothorax.
IMPRESSION: Mild diffuse interstitial opacities suggesting interstitial
inflammatory process. No focal pneumonia

## 2020-09-30 DIAGNOSIS — R102 Pelvic and perineal pain: Secondary | ICD-10-CM | POA: Diagnosis not present

## 2020-09-30 DIAGNOSIS — R309 Painful micturition, unspecified: Secondary | ICD-10-CM | POA: Diagnosis not present

## 2020-10-23 DIAGNOSIS — N920 Excessive and frequent menstruation with regular cycle: Secondary | ICD-10-CM | POA: Diagnosis not present

## 2020-11-09 DIAGNOSIS — N3942 Incontinence without sensory awareness: Secondary | ICD-10-CM | POA: Diagnosis not present

## 2020-11-09 DIAGNOSIS — R8271 Bacteriuria: Secondary | ICD-10-CM | POA: Diagnosis not present

## 2020-11-09 DIAGNOSIS — R102 Pelvic and perineal pain: Secondary | ICD-10-CM | POA: Diagnosis not present

## 2020-11-12 ENCOUNTER — Telehealth: Payer: Self-pay | Admitting: Physician Assistant

## 2020-11-12 NOTE — Telephone Encounter (Signed)
Patient left voice mail to return her call to re-establish care with our office. I left voice mail to call us. Thank you

## 2020-12-02 ENCOUNTER — Encounter: Payer: Self-pay | Admitting: Nurse Practitioner

## 2020-12-02 ENCOUNTER — Ambulatory Visit (INDEPENDENT_AMBULATORY_CARE_PROVIDER_SITE_OTHER): Payer: BC Managed Care – PPO | Admitting: Nurse Practitioner

## 2020-12-02 ENCOUNTER — Other Ambulatory Visit: Payer: Self-pay

## 2020-12-02 VITALS — BP 106/70 | HR 95 | Temp 98.4°F | Ht 63.0 in | Wt 165.1 lb

## 2020-12-02 DIAGNOSIS — F411 Generalized anxiety disorder: Secondary | ICD-10-CM

## 2020-12-02 DIAGNOSIS — Z87898 Personal history of other specified conditions: Secondary | ICD-10-CM

## 2020-12-02 DIAGNOSIS — F1020 Alcohol dependence, uncomplicated: Secondary | ICD-10-CM | POA: Insufficient documentation

## 2020-12-02 DIAGNOSIS — G479 Sleep disorder, unspecified: Secondary | ICD-10-CM | POA: Diagnosis not present

## 2020-12-02 MED ORDER — SERTRALINE HCL 100 MG PO TABS: 100.0000 mg | ORAL_TABLET | Freq: Every day | ORAL | 0 refills | Status: DC

## 2020-12-02 MED ORDER — NALTREXONE HCL 50 MG PO TABS: 50.0000 mg | ORAL_TABLET | Freq: Every day | ORAL | 1 refills | Status: DC

## 2020-12-02 NOTE — Patient Instructions (Signed)

## 2020-12-02 NOTE — Progress Notes (Signed)
Established Patient Office Visit  Subjective:  Patient ID: Connie Cole, female    DOB: Sep 11, 1970  Age: 50 y.o. MRN: [id number]  CC:  Chief Complaint  Patient presents with  . Follow-up    HPI Connie Cole presents for follow up visit. She is currently taking sertraline 100mg  daily. States that her anxiety is very well controlled. She states that she has a problem with sleep. Problem occurs with falling and staying asleep. She states that this has been a problem for at least 20 years, she states that she has tried multiple sleep aids, both over the counter and prescription. Nothing has really helped her. She states that to cope, she does consume between 2 to 3 glasses of wine or alcohol per night. She states that consumption used to be more than that and she has weaned herself down as much as she can without medicinal help. She states that she has recently purchased a sleep mask which is associated with app for the phone that provides biofeedback to improve anxiety and sleep. She states that her sleep quality is much better than it had been, but she is really unable to cut out the few glasses of alcohol she drinks each night. She states that she does not drink at all during the day. She does not crave it. She does not need an "eye opener" to get her day started. Her craving for alcohol is only at night to help her sleep.  She denies other concerns or complaints. She denies chest pain, chest pressure, or shortness of breath. She denies headaches or visual disturbances. She denies abdominal pain, nausea, vomiting, or changes in bowel or bladder habits.  She is due to have routine, fasting blood work. She does see GYN provider for well woman exams. Her last pap smear was doe 09/2019 and was normal. She appears to be due to have screening mammogram and colonoscopy.   Past Medical History:  Diagnosis Date  . Arthritis    hands, hips  . GERD (gastroesophageal reflux disease)    occasional diet  controlled- no med  . Palpitations    wore heart monitor x 1 wk, unknown - stress related    Past Surgical History:  Procedure Laterality Date  . COLONOSCOPY     polyp  . DILITATION & CURRETTAGE/HYSTROSCOPY WITH HYDROTHERMAL ABLATION N/A 06/03/2016   Procedure: DILATATION & CURETTAGE/HYSTEROSCOPY WITH HYDROTHERMAL ABLATION;  Surgeon: Brien Few, MD;  Location: [facility];  Service: Gynecology;  Laterality: N/A;  . IR RADIOLOGIST EVAL & MGMT  09/06/2016  . TONSILLECTOMY    . TOTAL HIP ARTHROPLASTY Right 01/10/2019   Procedure: TOTAL HIP ARTHROPLASTY ANTERIOR APPROACH;  Surgeon: Paralee Cancel, MD;  Location: [facility];  Service: Orthopedics;  Laterality: Right;  70 mins    Family History  Problem Relation Age of Onset  . Stroke Mother   . Heart disease Mother   . Hypertension Mother   . Cancer Father        eye  . Healthy Sister     Social History   Socioeconomic History  . Marital status: Married    Spouse name: Not on file  . Number of children: Not on file  . Years of education: Not on file  . Highest education level: Not on file  Occupational History  . Not on file  Tobacco Use  . Smoking status: Former Smoker    Packs/day: 0.50    Years: 5.00    Pack years: 2.50    Types:  Cigarettes    Quit date: 06/27/2000    Years since quitting: 20.4  . Smokeless tobacco: Never Used  Substance and Sexual Activity  . Alcohol use: Yes    Alcohol/week: 2.0 standard drinks    Types: 2 Shots of liquor per week  . Drug use: No  . Sexual activity: Yes    Birth control/protection: None  Other Topics Concern  . Not on file  Social History Narrative   ** Merged History Encounter **       Social Determinants of Health   Financial Resource Strain: Not on file  Food Insecurity: Not on file  Transportation Needs: Not on file  Physical Activity: Not on file  Stress: Not on file  Social Connections: Not on file  Intimate Partner Violence: Not on file    Outpatient Medications Prior  to Visit  Medication Sig Dispense Refill  . Krill Oil 500 MG CAPS Take 500 mg by mouth daily.    . sertraline (ZOLOFT) 100 MG tablet Take 1 tablet (100 mg total) by mouth daily. 30 tablet 0  . acetaminophen (TYLENOL) 500 MG tablet Take 2 tablets (1,000 mg total) by mouth every 8 (eight) hours. (Patient not taking: Reported on 12/02/2020) 30 tablet 0  . albuterol (PROVENTIL HFA;VENTOLIN HFA) 108 (90 Base) MCG/ACT inhaler Inhale 1-2 puffs into the lungs every 6 (six) hours as needed for wheezing or shortness of breath. (Patient not taking: Reported on 12/02/2020) 1 Inhaler 0  . busPIRone (BUSPAR) 15 MG tablet Take 1 tablet (15 mg total) by mouth 2 (two) times daily. (Patient not taking: Reported on 12/02/2020) 180 tablet 0  . COLLAGEN PO Take 1 capsule by mouth daily. Ultimate Collagen (Patient not taking: Reported on 12/02/2020)    . cyclobenzaprine (FLEXERIL) 10 MG tablet Take 1 tablet (10 mg total) by mouth 3 (three) times daily as needed for muscle spasms. (Patient not taking: Reported on 12/02/2020) 30 tablet 0  . docusate sodium (COLACE) 100 MG capsule Take 1 capsule (100 mg total) by mouth 2 (two) times daily. (Patient not taking: Reported on 12/02/2020) 28 capsule 0  . ferrous sulfate (FERROUSUL) 325 (65 FE) MG tablet Take 1 tablet (325 mg total) by mouth 3 (three) times daily with meals for 14 days. 42 tablet 0  . fluticasone (FLONASE) 50 MCG/ACT nasal spray 1 spray each nostril following sinus rinses twice daily (Patient not taking: Reported on 12/02/2020) 16 g 2  . Misc Natural Products (BLACK CHERRY CONCENTRATE PO) Take 1,400 mg by mouth daily. (Patient not taking: Reported on 12/02/2020)    . polyethylene glycol (MIRALAX / GLYCOLAX) 17 g packet Take 17 g by mouth 2 (two) times daily. (Patient not taking: Reported on 12/02/2020) 28 packet 0  . zolpidem (AMBIEN) 5 MG tablet Take 1 tablet (5 mg total) by mouth at bedtime as needed for sleep. (Patient not taking: Reported on 12/02/2020) 90 tablet 0  . oxyCODONE  (OXY IR/ROXICODONE) 5 MG immediate release tablet Take 1-2 tablets (5-10 mg total) by mouth every 4 (four) hours as needed for moderate pain or severe pain. (Patient not taking: Reported on 12/02/2020) 60 tablet 0   No facility-administered medications prior to visit.    Allergies  Allergen Reactions  . Latex Swelling  . Latex Rash    ROS Review of Systems  Constitutional: Negative for activity change, chills, fatigue and fever.  HENT: Negative for congestion, postnasal drip, rhinorrhea, sinus pressure and sinus pain.   Eyes: Negative.   Respiratory: Negative for  cough, chest tightness and wheezing.   Cardiovascular: Negative for chest pain and palpitations.  Gastrointestinal: Negative for constipation, diarrhea, nausea and vomiting.  Endocrine: Negative for cold intolerance, heat intolerance, polydipsia and polyuria.       History of pituitary microadenoma for which she sees specialist.   Genitourinary:       Sees GYN provider for well woman exam.   Musculoskeletal: Negative for back pain and myalgias.  Skin: Negative for rash.  Allergic/Immunologic: Negative.   Neurological: Negative for dizziness, weakness and headaches.  Hematological: Negative.   Psychiatric/Behavioral: Positive for suicidal ideas. The patient is nervous/anxious.        Alcohol use disorder with use primarily at night.       Objective:    Physical Exam Vitals and nursing note reviewed.  Constitutional:      Appearance: Normal appearance. She is well-developed.  HENT:     Head: Normocephalic and atraumatic.     Mouth/Throat:     Mouth: Mucous membranes are moist.  Eyes:     Conjunctiva/sclera: Conjunctivae normal.     Pupils: Pupils are equal, round, and reactive to light.  Cardiovascular:     Rate and Rhythm: Normal rate and regular rhythm.     Pulses: Normal pulses.     Heart sounds: Normal heart sounds.  Pulmonary:     Effort: Pulmonary effort is normal.     Breath sounds: Normal breath  sounds.  Abdominal:     Palpations: Abdomen is soft.  Musculoskeletal:        General: Normal range of motion.     Cervical back: Normal range of motion and neck supple.  Skin:    General: Skin is warm and dry.     Capillary Refill: Capillary refill takes less than 2 seconds.  Neurological:     General: No focal deficit present.     Mental Status: She is alert and oriented to person, place, and time.  Psychiatric:        Mood and Affect: Mood normal.        Behavior: Behavior normal.        Thought Content: Thought content normal.        Judgment: Judgment normal.     Today's Vitals   12/02/20 1317  BP: 106/70  Pulse: 95  Temp: 98.4 F (36.9 C)  SpO2: 97%  Weight: 165 lb 1.6 oz (74.9 kg)  Height: 5\' 3"  (1.6 m)   Body mass index is 29.25 kg/m.   Wt Readings from Last 3 Encounters:  12/02/20 165 lb 1.6 oz (74.9 kg)  04/04/19 145 lb (65.8 kg)  01/10/19 144 lb 8 oz (65.5 kg)     Health Maintenance Due  Topic Date Due  . COVID-19 Vaccine (1) Never done  . HIV Screening  Never done  . Hepatitis C Screening  Never done  . TETANUS/TDAP  Never done  . COLONOSCOPY (Pts 45-110yrs Insurance coverage will need to be confirmed)  Never done  . MAMMOGRAM  01/10/2019    There are no preventive care reminders to display for this patient.  Lab Results  Component Value Date   TSH 2.740 03/05/2018   Lab Results  Component Value Date   WBC 6.4 01/11/2019   HGB 9.9 (L) 01/11/2019   HCT 30.7 (L) 01/11/2019   MCV 105.9 (H) 01/11/2019   PLT 246 01/11/2019   Lab Results  Component Value Date   NA 133 (L) 01/11/2019   K 3.1 (L) 01/11/2019  CO2 25 01/11/2019   GLUCOSE 134 (H) 01/11/2019   BUN 7 01/11/2019   CREATININE 0.45 01/11/2019   BILITOT 0.6 11/07/2017   ALKPHOS 43 11/07/2017   AST 23 11/07/2017   ALT 15 11/07/2017   PROT 7.3 11/07/2017   ALBUMIN 4.4 11/07/2017   CALCIUM 8.1 (L) 01/11/2019   ANIONGAP 7 01/11/2019   Lab Results  Component Value Date   CHOL  178 09/05/2017   Lab Results  Component Value Date   HDL 86 09/05/2017   Lab Results  Component Value Date   LDLCALC 74 09/05/2017   Lab Results  Component Value Date   TRIG 90 09/05/2017   Lab Results  Component Value Date   CHOLHDL 2.1 09/05/2017   Lab Results  Component Value Date   HGBA1C 4.8 09/05/2017      Assessment & Plan:  1. GAD (generalized anxiety disorder) Anxiety well managed. Continue sertraline at 100mg  daily. Refill provided today - sertraline (ZOLOFT) 100 MG tablet; Take 1 tablet (100 mg total) by mouth daily.  Dispense: 90 tablet; Refill: 0  2. Sleeping difficulty Likely anxiety based. Has tried multiple medications, both over the counter and prescription without relief. Now using sleep mask [employer] [facility] and sleep quality has improved. Will monitor.   3. Alcohol use disorder, moderate, dependence ([place]) Start naltrexone. Recommend she start with 1/2 tablet every evening and may increase to whole tablet as indicated. Will follow up in 3 to 4 weeks for further evaluation.   - naltrexone (DEPADE) 50 MG tablet; Take 1 tablet (50 mg total) by mouth daily.  Dispense: 30 tablet; Refill: 1  4. H/O: pituitary tumor Patient being seen routinely at [facility] for this.   Problem List Items Addressed This Visit      Endocrine   H/O: pituitary tumor     Other   GAD (generalized anxiety disorder) - Primary   Relevant Medications   sertraline (ZOLOFT) 100 MG tablet   Sleeping difficulty   Alcohol use disorder, moderate, dependence (H[facility])   Relevant Medications   naltrexone (DEPADE) 50 MG tablet      Meds ordered this encounter  Medications  . naltrexone (DEPADE) 50 MG tablet    Sig: Take 1 tablet (50 mg total) by mouth daily.    Dispense:  30 tablet    Refill:  1    Order Specific Question:   Supervising Provider    Answer:   Beatrice Lecher D [[id number]]  . sertraline (ZOLOFT) 100 MG tablet    Sig: Take 1 tablet (100 mg total)  by mouth daily.    Dispense:  90 tablet    Refill:  0    Order Specific Question:   Supervising Provider    Answer:   Beatrice Lecher D [[id number]]    Follow-up: Return in about 4 weeks (around 12/30/2020) for mood - fbw a week prior to visit .    Ronnell Freshwater, NP

## 2020-12-08 ENCOUNTER — Telehealth: Payer: Self-pay | Admitting: Nurse Practitioner

## 2020-12-08 NOTE — Telephone Encounter (Signed)
Called pt she stated that she going to take half of the pill instead of a whole one

## 2020-12-08 NOTE — Telephone Encounter (Signed)
Patient was put on a medication for helping the cravings with alcohol at night. She originally started off with half a tablet at night and increased it to one pill at night two or three days ago.She is feeling nauseous and is wondering if that is the alcohol getting out of her system or the medication, please advise. Thanks

## 2020-12-23 ENCOUNTER — Other Ambulatory Visit: Payer: BC Managed Care – PPO

## 2020-12-30 ENCOUNTER — Ambulatory Visit: Payer: BC Managed Care – PPO | Admitting: Nurse Practitioner

## 2021-01-21 ENCOUNTER — Ambulatory Visit (INDEPENDENT_AMBULATORY_CARE_PROVIDER_SITE_OTHER): Payer: PRIVATE HEALTH INSURANCE | Admitting: Nurse Practitioner

## 2021-01-21 ENCOUNTER — Encounter: Payer: Self-pay | Admitting: Nurse Practitioner

## 2021-01-21 VITALS — Ht 63.0 in | Wt 165.0 lb

## 2021-01-21 DIAGNOSIS — U071 COVID-19: Secondary | ICD-10-CM | POA: Diagnosis not present

## 2021-01-21 DIAGNOSIS — J069 Acute upper respiratory infection, unspecified: Secondary | ICD-10-CM | POA: Insufficient documentation

## 2021-01-21 NOTE — Progress Notes (Signed)
Virtual Visit via Telephone Note  I connected with Connie Cole on 01/21/21 at  8:10 AM EDT by telephone and verified that I am speaking with the correct person using two identifiers.  Location: Patient: home Provider: [facility]     I discussed the limitations, risks, security and privacy concerns of performing an evaluation and management service by telephone and the availability of in person appointments. I also discussed with the patient that there may be a patient responsible charge related to this service. The patient expressed understanding and agreed to proceed.   History of Present Illness: Patient states that on Monday she started having severe body aches.  She stated that everything hurts so bad.  She developed a fever of 102.3.  COVID COVID test which was positive.  She does have some congestion and has moderate to severe fatigue.  She states that fever broke last night.  Body aches are improved.  Fatigue is just persistent.  She denies chest pain, chest pressure, shortness of breath.  She denies nausea, vomiting, or diarrhea.  Denies cough or sore throat.  She states she has taken some Mucinex and Tylenol.  She is drinking plenty of fluids.  She is also started taking extra vitamin C.   Observations/Objective:  The patient is alert and oriented. She is pleasant and answers all questions appropriately. Breathing is non-labored. She is in no acute distress at this time.  The patient is nasally congested and sounds as though she does not feel well.  Today's Vitals   01/21/21 0804  Weight: 165 lb (74.8 kg)  Height: '5\' 3"'$  (1.6 m)   Body mass index is 29.23 kg/m.   Assessment and Plan:  1. Upper respiratory tract infection due to COVID-19 virus Patient had positive COVID-19 test on Monday.  She reports that her symptoms are gradually improving.Rest and increase fluids. Continue using OTC medication to control symptoms.  I recommended she take vitamin  C, vitamin D, and zinc to improve immune system and help lessen the length of time symptoms are present.  Patient advised to seek emergency care if she became short of breath or developed a cough that caused chest pain or pressure, or symptoms started to worsen.  She voiced understanding and agreement.  Follow Up Instructions:    I discussed the assessment and treatment plan with the patient. The patient was provided an opportunity to ask questions and all were answered. The patient agreed with the plan and demonstrated an understanding of the instructions.   The patient was advised to call back or seek an in-person evaluation if the symptoms worsen or if the condition fails to improve as anticipated.  I provided 10 minutes of non-face-to-face time during this encounter.   Ronnell Freshwater, NP

## 2021-02-08 ENCOUNTER — Telehealth: Payer: Self-pay | Admitting: Nurse Practitioner

## 2021-02-08 NOTE — Telephone Encounter (Signed)
Patient is 3 weeks out with COVID and still having a lot of head congestion.  She took Mucinex during [place] and still taking.  She has also been taking Vitamin C, D and Zinc.  Still very tired and wants to know what else she can take?  Please advise.

## 2021-02-08 NOTE — Telephone Encounter (Signed)
Called and Spoke with patient.  Patient is aware of recommendations.

## 2021-02-08 NOTE — Telephone Encounter (Signed)
Unfortunately, this is one of the symptoms that seems to linger for some individuals who are post COVID 19 infection. She may consider adding DayQuil to manage daytime symptoms, especially if she has gone back to work. Will help with congestion and should not make her sleepy. She should continue to take vitamins C, D, and Zinc. Rest when she can and drink plenty of fluids.

## 2021-02-10 ENCOUNTER — Other Ambulatory Visit: Payer: Self-pay

## 2021-02-10 DIAGNOSIS — Z0000 Encounter for general adult medical examination without abnormal findings: Secondary | ICD-10-CM

## 2021-02-10 DIAGNOSIS — R7989 Other specified abnormal findings of blood chemistry: Secondary | ICD-10-CM

## 2021-02-11 ENCOUNTER — Other Ambulatory Visit: Payer: PRIVATE HEALTH INSURANCE

## 2021-02-11 ENCOUNTER — Other Ambulatory Visit: Payer: Self-pay

## 2021-02-11 DIAGNOSIS — Z0000 Encounter for general adult medical examination without abnormal findings: Secondary | ICD-10-CM

## 2021-02-11 DIAGNOSIS — R7989 Other specified abnormal findings of blood chemistry: Secondary | ICD-10-CM

## 2021-02-12 LAB — CBC WITH DIFFERENTIAL/PLATELET
Basophils Absolute: 0.1 10*3/uL (ref 0.0–0.2)
Basos: 1 %
EOS (ABSOLUTE): 0.2 10*3/uL (ref 0.0–0.4)
Eos: 5 %
Hematocrit: 32.7 % — ABNORMAL LOW (ref 34.0–46.6)
Hemoglobin: 10.9 g/dL — ABNORMAL LOW (ref 11.1–15.9)
Immature Grans (Abs): 0 10*3/uL (ref 0.0–0.1)
Immature Granulocytes: 0 %
Lymphocytes Absolute: 1.3 10*3/uL (ref 0.7–3.1)
Lymphs: 29 %
MCH: 31.4 pg (ref 26.6–33.0)
MCHC: 33.3 g/dL (ref 31.5–35.7)
MCV: 94 fL (ref 79–97)
Monocytes Absolute: 0.5 10*3/uL (ref 0.1–0.9)
Monocytes: 11 %
Neutrophils Absolute: 2.4 10*3/uL (ref 1.4–7.0)
Neutrophils: 54 %
Platelets: 366 10*3/uL (ref 150–450)
RBC: 3.47 x10E6/uL — ABNORMAL LOW (ref 3.77–5.28)
RDW: 12.1 % (ref 11.7–15.4)
WBC: 4.5 10*3/uL (ref 3.4–10.8)

## 2021-02-12 LAB — LIPID PANEL
Chol/HDL Ratio: 2.2 ratio (ref 0.0–4.4)
Cholesterol, Total: 202 mg/dL — ABNORMAL HIGH (ref 100–199)
HDL: 92 mg/dL (ref >39)
LDL Chol Calc (NIH): 87 mg/dL (ref 0–99)
Triglycerides: 139 mg/dL (ref 0–149)
VLDL Cholesterol Cal: 23 mg/dL (ref 5–40)

## 2021-02-12 LAB — HEMOGLOBIN A1C
Est. average glucose Bld gHb Est-mCnc: 85 mg/dL
Hgb A1c MFr Bld: 4.6 % — ABNORMAL LOW (ref 4.8–5.6)

## 2021-02-12 LAB — COMPREHENSIVE METABOLIC PANEL
ALT: 15 IU/L (ref 0–32)
AST: 19 IU/L (ref 0–40)
Albumin/Globulin Ratio: 1.7 (ref 1.2–2.2)
Albumin: 4.3 g/dL (ref 3.8–4.8)
Alkaline Phosphatase: 45 IU/L (ref 44–121)
BUN/Creatinine Ratio: 18 (ref 9–23)
BUN: 13 mg/dL (ref 6–24)
Bilirubin Total: <0.2 mg/dL (ref 0.0–1.2)
CO2: 22 mmol/L (ref 20–29)
Calcium: 9.7 mg/dL (ref 8.7–10.2)
Chloride: 103 mmol/L (ref 96–106)
Creatinine, Ser: 0.71 mg/dL (ref 0.57–1.00)
Globulin, Total: 2.6 g/dL (ref 1.5–4.5)
Glucose: 86 mg/dL (ref 65–99)
Potassium: 4.6 mmol/L (ref 3.5–5.2)
Sodium: 139 mmol/L (ref 134–144)
Total Protein: 6.9 g/dL (ref 6.0–8.5)
eGFR: 104 mL/min/{1.73_m2} (ref >59)

## 2021-02-12 LAB — TSH: TSH: 2.63 u[IU]/mL (ref 0.450–4.500)

## 2021-02-12 LAB — PROLACTIN: Prolactin: 27.6 ng/mL — ABNORMAL HIGH (ref 4.8–23.3)

## 2021-02-15 NOTE — Progress Notes (Signed)
Will discuss with patient at her next visit.

## 2021-02-16 ENCOUNTER — Other Ambulatory Visit: Payer: Self-pay

## 2021-02-16 ENCOUNTER — Encounter: Payer: Self-pay | Admitting: Nurse Practitioner

## 2021-02-16 ENCOUNTER — Ambulatory Visit (INDEPENDENT_AMBULATORY_CARE_PROVIDER_SITE_OTHER): Payer: Managed Care, Other (non HMO) | Admitting: Nurse Practitioner

## 2021-02-16 VITALS — BP 127/91 | HR 122 | Temp 98.4°F | Ht 63.0 in | Wt 165.1 lb

## 2021-02-16 DIAGNOSIS — D443 Neoplasm of uncertain behavior of pituitary gland: Secondary | ICD-10-CM | POA: Insufficient documentation

## 2021-02-16 DIAGNOSIS — F1020 Alcohol dependence, uncomplicated: Secondary | ICD-10-CM | POA: Diagnosis not present

## 2021-02-16 DIAGNOSIS — F411 Generalized anxiety disorder: Secondary | ICD-10-CM | POA: Diagnosis not present

## 2021-02-16 DIAGNOSIS — Z6829 Body mass index (BMI) 29.0-29.9, adult: Secondary | ICD-10-CM

## 2021-02-16 DIAGNOSIS — F5101 Primary insomnia: Secondary | ICD-10-CM | POA: Insufficient documentation

## 2021-02-16 DIAGNOSIS — R7989 Other specified abnormal findings of blood chemistry: Secondary | ICD-10-CM

## 2021-02-16 MED ORDER — NALTREXONE HCL 50 MG PO TABS: 50.0000 mg | ORAL_TABLET | Freq: Every day | ORAL | 1 refills | Status: DC

## 2021-02-16 MED ORDER — SERTRALINE HCL 100 MG PO TABS: 100.0000 mg | ORAL_TABLET | Freq: Every day | ORAL | 0 refills | Status: DC

## 2021-02-16 NOTE — Progress Notes (Signed)
Established Patient Office Visit  Subjective:  Patient ID: Connie Cole, female    DOB: 1971-01-30  Age: 50 y.o. MRN: [id number]  CC:  Chief Complaint  Patient presents with   Depression    HPI [name] presents for evaluation of depression and alcohol use disorder.  Did add naltrexone on her most recent visit.  She did very well initially.  After 2 weeks she had very thick with COVID-19.  Well having symptoms she gradually weaned herself off, but then started drinking again.  She states that she is having about 4 drinks per night.  States that she is not having any liquor intake.  Drinking only water seltzer beer.  She stopped taking some.  Would like to start taking naltrexone again.  She states that she has been very fatigued since having COVID-19.  She denies presence of fever, chills, body aches or pain.  She does have some nasal congestion which has been persistent.  She denies abdominal pain, nausea, vomiting, or diarrhea. She did have routine Fasting labs done on 02/12/2021.  Prolactin level was elevated at 27.6.  She does have history of pituitary microadenoma.  Current prolactin level lower than prior checks.  We will continue to monitor.  She is mildly anemic though this is also improved since last checked.  Past Medical History:  Diagnosis Date   Arthritis    hands, hips   GERD (gastroesophageal reflux disease)    occasional diet controlled- no med   Palpitations    wore heart monitor x 1 wk, unknown - stress related    Past Surgical History:  Procedure Laterality Date   COLONOSCOPY     polyp   DILITATION & CURRETTAGE/HYSTROSCOPY WITH HYDROTHERMAL ABLATION N/A 06/03/2016   Procedure: DILATATION & CURETTAGE/HYSTEROSCOPY WITH HYDROTHERMAL ABLATION;  Surgeon: Brien Few, MD;  Location: [facility];  Service: Gynecology;  Laterality: N/A;   IR RADIOLOGIST EVAL & MGMT  09/06/2016   TONSILLECTOMY     TOTAL HIP ARTHROPLASTY Right 01/10/2019   Procedure: TOTAL HIP  ARTHROPLASTY ANTERIOR APPROACH;  Surgeon: Paralee Cancel, MD;  Location: [facility];  Service: Orthopedics;  Laterality: Right;  70 mins    Family History  Problem Relation Age of Onset   Stroke Mother    Heart disease Mother    Hypertension Mother    Cancer Father        eye   Healthy Sister     Social History   Socioeconomic History   Marital status: Married    Spouse name: Not on file   Number of children: Not on file   Years of education: Not on file   Highest education level: Not on file  Occupational History   Not on file  Tobacco Use   Smoking status: Former    Packs/day: 0.50    Years: 5.00    Pack years: 2.50    Types: Cigarettes    Quit date: 06/27/2000    Years since quitting: 20.6   Smokeless tobacco: Never  Substance and Sexual Activity   Alcohol use: Yes    Alcohol/week: 2.0 standard drinks    Types: 2 Shots of liquor per week   Drug use: No   Sexual activity: Yes    Birth control/protection: None  Other Topics Concern   Not on file  Social History Narrative   ** Merged History Encounter **       Social Determinants of Health   Financial Resource Strain: Not on file  Food Insecurity: Not  on file  Transportation Needs: Not on file  Physical Activity: Not on file  Stress: Not on file  Social Connections: Not on file  Intimate Partner Violence: Not on file    Outpatient Medications Prior to Visit  Medication Sig Dispense Refill   sertraline (ZOLOFT) 100 MG tablet Take 1 tablet (100 mg total) by mouth daily. 90 tablet 0   ferrous sulfate (FERROUSUL) 325 (65 FE) MG tablet Take 1 tablet (325 mg total) by mouth 3 (three) times daily with meals for 14 days. 42 tablet 0   No facility-administered medications prior to visit.    Allergies  Allergen Reactions   Latex Swelling   Latex Rash    ROS Review of Systems  Constitutional:  Positive for fatigue. Negative for activity change, appetite change, chills and fever.  HENT:  Positive for congestion.  Negative for postnasal drip, rhinorrhea, sinus pressure, sinus pain, sneezing and sore throat.   Eyes: Negative.   Respiratory:  Negative for cough, chest tightness, shortness of breath and wheezing.   Cardiovascular:  Negative for chest pain and palpitations.  Gastrointestinal:  Negative for abdominal pain, constipation, diarrhea, nausea and vomiting.  Endocrine: Negative for cold intolerance, heat intolerance, polydipsia and polyuria.  Genitourinary:  Negative for dyspareunia, dysuria, flank pain, frequency and urgency.  Musculoskeletal:  Negative for arthralgias, back pain and myalgias.  Skin:  Negative for rash.  Allergic/Immunologic: Negative for environmental allergies.  Neurological:  Negative for dizziness, weakness and headaches.  Hematological:  Negative for adenopathy.  Psychiatric/Behavioral:  Positive for dysphoric mood and sleep disturbance. The patient is not nervous/anxious.      Objective:    Physical Exam Vitals and nursing note reviewed.  Constitutional:      Appearance: Normal appearance. She is well-developed.  HENT:     Head: Normocephalic and atraumatic.     Nose: Nose normal.     Mouth/Throat:     Mouth: Mucous membranes are moist.  Eyes:     Extraocular Movements: Extraocular movements intact.     Conjunctiva/sclera: Conjunctivae normal.     Pupils: Pupils are equal, round, and reactive to light.  Cardiovascular:     Rate and Rhythm: Normal rate and regular rhythm.     Pulses: Normal pulses.     Heart sounds: Normal heart sounds.  Pulmonary:     Effort: Pulmonary effort is normal.     Breath sounds: Normal breath sounds.  Abdominal:     General: Bowel sounds are normal.     Palpations: Abdomen is soft.     Tenderness: There is no abdominal tenderness.  Musculoskeletal:        General: Normal range of motion.     Cervical back: Normal range of motion and neck supple.  Lymphadenopathy:     Cervical: No cervical adenopathy.  Skin:    General: Skin  is warm and dry.     Capillary Refill: Capillary refill takes less than 2 seconds.  Neurological:     General: No focal deficit present.     Mental Status: She is alert and oriented to person, place, and time.  Psychiatric:        Mood and Affect: Mood normal.        Behavior: Behavior normal.        Thought Content: Thought content normal.        Judgment: Judgment normal.    Today's Vitals   02/16/21 1429  BP: (!) 127/91  Pulse: (!) 122  Temp: 98.4 F (  36.9 C)  SpO2: 96%  Weight: 165 lb 1.6 oz (74.9 kg)  Height: '5\' 3"'  (1.6 m)   Body mass index is 29.25 kg/m.   Wt Readings from Last 3 Encounters:  02/16/21 165 lb 1.6 oz (74.9 kg)  01/21/21 165 lb (74.8 kg)  12/02/20 165 lb 1.6 oz (74.9 kg)     Health Maintenance Due  Topic Date Due   COVID-19 Vaccine (1) Never done   Pneumococcal Vaccine 53-69 Years old (1 - PCV) Never done   HIV Screening  Never done   Hepatitis C Screening  Never done   TETANUS/TDAP  Never done   COLONOSCOPY (Pts 45-39yr Insurance coverage will need to be confirmed)  Never done   MAMMOGRAM  01/10/2019   Zoster Vaccines- Shingrix (1 of 2) Never done   INFLUENZA VACCINE  01/25/2021    There are no preventive care reminders to display for this patient.  Lab Results  Component Value Date   TSH 2.630 02/11/2021   Lab Results  Component Value Date   WBC 4.5 02/11/2021   HGB 10.9 (L) 02/11/2021   HCT 32.7 (L) 02/11/2021   MCV 94 02/11/2021   PLT 366 02/11/2021   Lab Results  Component Value Date   NA 139 02/11/2021   K 4.6 02/11/2021   CO2 22 02/11/2021   GLUCOSE 86 02/11/2021   BUN 13 02/11/2021   CREATININE 0.71 02/11/2021   BILITOT <0.2 02/11/2021   ALKPHOS 45 02/11/2021   AST 19 02/11/2021   ALT 15 02/11/2021   PROT 6.9 02/11/2021   ALBUMIN 4.3 02/11/2021   CALCIUM 9.7 02/11/2021   ANIONGAP 7 01/11/2019   EGFR 104 02/11/2021   Lab Results  Component Value Date   CHOL 202 (H) 02/11/2021   Lab Results  Component Value  Date   HDL 92 02/11/2021   Lab Results  Component Value Date   LDLCALC 87 02/11/2021   Lab Results  Component Value Date   TRIG 139 02/11/2021   Lab Results  Component Value Date   CHOLHDL 2.2 02/11/2021   Lab Results  Component Value Date   HGBA1C 4.6 (L) 02/11/2021      Assessment & Plan:  1. Prolactin increased Recent prolactin level 27.6.  This is improved since prior checks.  She does have a history of pituitary microadenoma.  We will continue to monitor  2. Alcohol use disorder, moderate, dependence (HCC) Restart naltrexone.  Patient to gradually taper dosing to 1 tablet daily.  She should gradually decrease intake of alcohol or any sort.  She agrees with plan. - naltrexone (DEPADE) 50 MG tablet; Take 1 tablet (50 mg total) by mouth daily. Take 1/2 tablet po QD for first 7 days then increase to 1 tablet po QD  Dispense: 30 tablet; Refill: 1  3. GAD (generalized anxiety disorder) Patient currently on Zoloft 100 mg tablets daily.  Continue as prescribed. - sertraline (ZOLOFT) 100 MG tablet; Take 1 tablet (100 mg total) by mouth daily.  Dispense: 90 tablet; Refill: 0  4. Body mass index 29.0-29.9, adult Patient advised to limit daily calorie intake to 1500 cal/day consisting of low-fat, low-cholesterol foods.  She should continue to incorporate exercise into her daily routine.  Problem List Items Addressed This Visit       Other   GAD (generalized anxiety disorder)   Relevant Medications   sertraline (ZOLOFT) 100 MG tablet   Body mass index 29.0-29.9, adult   Alcohol use disorder, moderate, dependence ([facility]  Relevant Medications   naltrexone (DEPADE) 50 MG tablet   Prolactin increased - Primary    Meds ordered this encounter  Medications   naltrexone (DEPADE) 50 MG tablet    Sig: Take 1 tablet (50 mg total) by mouth daily. Take 1/2 tablet po QD for first 7 days then increase to 1 tablet po QD    Dispense:  30 tablet    Refill:  1    Order Specific Question:    Supervising Provider    Answer:   Beatrice Lecher D [[id number]]   sertraline (ZOLOFT) 100 MG tablet    Sig: Take 1 tablet (100 mg total) by mouth daily.    Dispense:  90 tablet    Refill:  0    Order Specific Question:   Supervising Provider    Answer:   Beatrice Lecher D [[id number]]   This note was dictated using Dragon Voice Recognition Software. Rapid proofreading was performed to expedite the delivery of the information. Despite proofreading, phonetic errors will occur which are common with this voice recognition software. Please take this into consideration. If there are any concerns, please contact our office.    Follow-up: Return in about 6 weeks (around 03/30/2021) for Mood.    Ronnell Freshwater, NP

## 2021-02-16 NOTE — Patient Instructions (Addendum)
http://APA.org/depression-guideline"> https://clinicalkey.com"> http://point-of-care.elsevierperformancemanager.com/skills/"> http://point-of-care.elsevierperformancemanager.com">  Managing Depression, Adult Depression is a mental health condition that affects your thoughts, feelings, and actions. Being diagnosed with depression can bring you relief if you did not know why you have felt or behaved a certain way. It could also leave you feeling overwhelmed with uncertainty about your future. Preparing yourself tomanage your symptoms can help you feel more positive about your future. How to manage lifestyle changes Managing stress  Stress is your body's reaction to life changes and events, both good and bad. Stress can add to your feelings of depression. Learning to manage your stresscan help lessen your feelings of depression. Try some of the following approaches to reducing your stress (stress reduction techniques): Listen to music that you enjoy and that inspires you. Try using a meditation app or take a meditation class. Develop a practice that helps you connect with your spiritual self. Walk in nature, pray, or go to a place of worship. Do some deep breathing. To do this, inhale slowly through your nose. Pause at the top of your inhale for a few seconds and then exhale slowly, letting your muscles relax. Practice yoga to help relax and work your muscles. Choose a stress reduction technique that suits your lifestyle and personality. These techniques take time and practice to develop. Set aside 5-15 minutes a day to do them. Therapists can offer training in these techniques. Other things you can do to manage stress include: Keeping a stress diary. Knowing your limits and saying no when you think something is too much. Paying attention to how you react to certain situations. You may not be able to control everything, but you can change your reaction. Adding humor to your life by watching funny films  or TV shows. Making time for activities that you enjoy and that relax you.  Medicines Medicines, such as antidepressants, are often a part of treatment for depression. Talk with your pharmacist or health care provider about all the medicines, supplements, and herbal products that you take, their possible side effects, and what medicines and other products are safe to take together. Make sure to report any side effects you may have to your health care provider. Relationships Your health care provider may suggest family therapy, couples therapy, orindividual therapy as part of your treatment. How to recognize changes Everyone responds differently to treatment for depression. As you recover from depression, you may start to: Have more interest in doing activities. Feel less hopeless. Have more energy. Overeat less often, or have a better appetite. Have better mental focus. It is important to recognize if your depression is not getting better or is getting worse. The symptoms you had in the beginning may return, such as: Tiredness (fatigue) or low energy. Eating too much or too little. Sleeping too much or too little. Feeling restless, agitated, or hopeless. Trouble focusing or making decisions. Unexplained physical complaints. Feeling irritable, angry, or aggressive. If you or your family members notice these symptoms coming back, let yourhealth care provider know right away. Follow these instructions at home: Activity  Try to get some form of exercise each day, such as walking, biking, swimming, or lifting weights. Practice stress reduction techniques. Engage your mind by taking a class or doing some volunteer work.  Lifestyle Get the right amount and quality of sleep. Cut down on using caffeine, tobacco, alcohol, and other potentially harmful substances. Eat a healthy diet that includes plenty of vegetables, fruits, whole grains, low-fat dairy products, and lean protein. Do not   eat  a lot of foods that are high in solid fats, added sugars, or salt (sodium). General instructions Take over-the-counter and prescription medicines only as told by your health care provider. Keep all follow-up visits as told by your health care provider. This is important. Where to find support Talking to others  Friends and family members can be sources of support and guidance. Talk to trusted friends or family members about your condition. Explain your symptoms to them, and let them know that you are working with a health care provider to treat your depression. Tell friends and family members how they also can behelpful. Finances Find appropriate mental health providers that fit with your financial situation. Talk with your health care provider about options to get reduced prices on your medicines. Where to find more information You can find support in your area from: Anxiety and Depression Association of America (ADAA): www.adaa.org Mental Health America: www.mentalhealthamerica.net [employer] on Mental Illness: www.nami.org Contact a health care provider if: You stop taking your antidepressant medicines, and you have any of these symptoms: Nausea. Headache. Light-headedness. Chills and body aches. Not being able to sleep (insomnia). You or your friends and family think your depression is getting worse. Get help right away if: You have thoughts of hurting yourself or others. If you ever feel like you may hurt yourself or others, or have thoughts about taking your own life, get help right away. Go to your nearest emergency department or: Call your local emergency services (911 in the [place] Call a suicide crisis helpline, such as the [place] at 302-402-0043. This is open 24 hours a day in the [place] Text the Crisis Text Line at (310)451-9251 (in the [place].). Summary If you are diagnosed with depression, preparing yourself to manage your symptoms is a good way  to feel positive about your future. Work with your health care provider on a management plan that includes stress reduction techniques, medicines (if applicable), therapy, and healthy lifestyle habits. Keep talking with your health care provider about how your treatment is working. If you have thoughts about taking your own life, call a suicide crisis helpline or text a crisis text line. This information is not intended to replace advice given to you by your health care provider. Make sure you discuss any questions you have with your healthcare provider. Document Revised: 04/24/2019 Document Reviewed: 04/24/2019 Elsevier Patient Education  2022 [place] and Cholesterol Restricted Eating Plan Getting too much fat and cholesterol in your diet may cause health problems. Choosing the right foods helps keep your fat and cholesterol at normal levels.This can keep you from getting certain diseases. Your doctor may recommend an eating plan that includes: Total fat: ______% or less of total calories a day. Saturated fat: ______% or less of total calories a day. Cholesterol: less than _________mg a day. Fiber: ______g a day. What are tips for following this plan? Meal planning At meals, divide your plate into four equal parts: Fill one-half of your plate with vegetables and green salads. Fill one-fourth of your plate with whole grains. Fill one-fourth of your plate with low-fat (lean) protein foods. Eat fish that is high in omega-3 fats at least two times a week. This includes mackerel, tuna, sardines, and salmon. Eat foods that are high in fiber, such as whole grains, beans, apples, broccoli, carrots, peas, and barley. General tips  Work with your doctor to lose weight if you need to. Avoid: Foods with added sugar. [place] [employer]  foods. Foods with partially hydrogenated oils. Limit alcohol intake to no more than 1 drink a day for nonpregnant women and 2 drinks a day for men. One drink equals  12 oz of beer, 5 oz of wine, or 1 oz of hard liquor.  Reading food labels Check food labels for: Trans fats. Partially hydrogenated oils. Saturated fat (g) in each serving. Cholesterol (mg) in each serving. Fiber (g) in each serving. Choose foods with healthy fats, such as: Monounsaturated fats. Polyunsaturated fats. Omega-3 fats. Choose grain products that have whole grains. Look for the word "whole" as the first word in the ingredient list. Cooking Cook foods using low-fat methods. These include baking, boiling, grilling, and broiling. Eat more home-cooked foods. Eat at restaurants and buffets less often. Avoid cooking using saturated fats, such as butter, cream, palm oil, palm kernel oil, and coconut oil. Recommended foods  Fruits All fresh, canned (in natural juice), or frozen fruits. Vegetables Fresh or frozen vegetables (raw, steamed, roasted, or grilled). Green salads. Grains Whole grains, such as whole wheat or whole grain breads, crackers, cereals, and pasta. Unsweetened oatmeal, bulgur, barley, quinoa, or brown rice. Corn or whole wheat flour tortillas. Meats and other protein foods Ground beef (85% or leaner), grass-fed beef, or beef trimmed of fat. Skinless chicken or [place]. Ground chicken or [place]. Pork trimmed of fat. All fish and seafood. Egg whites. Dried beans, peas, or lentils. Unsalted nuts or seeds. Unsalted canned beans. Nut butters without added sugar or oil. Dairy Low-fat or nonfat dairy products, such as skim or 1% milk, 2% or reduced-fat cheeses, low-fat and fat-free ricotta or cottage cheese, or plain low-fat and nonfat yogurt. Fats and oils Tub margarine without trans fats. Light or reduced-fat mayonnaise and salad dressings. Avocado. Olive, canola, sesame, or safflower oils. The items listed above may not be a complete list of foods and beverages youcan eat. Contact a dietitian for more information. Foods to avoid Fruits Canned fruit in heavy syrup.  Fruit in cream or butter sauce. Fried fruit. Vegetables Vegetables cooked in cheese, cream, or butter sauce. Fried vegetables. Grains White bread. White pasta. White rice. Cornbread. Bagels, pastries, and croissants. Crackers and snack foods that contain trans fat and hydrogenated oils. Meats and other protein foods Fatty cuts of meat. Ribs, chicken wings, bacon, sausage, bologna, salami, chitterlings, fatback, hot dogs, bratwurst, and packaged lunch meats. Liver and organ meats. Whole eggs and egg yolks. Chicken and [place] with skin. Fried meat. Dairy Whole or 2% milk, cream, half-and-half, and cream cheese. Whole milk cheeses. Whole-fat or sweetened yogurt. Full-fat cheeses. Nondairy creamers and whipped toppings. Processed cheese, cheese spreads, and cheese curds. Beverages Alcohol. Sugar-sweetened drinks such as sodas, lemonade, and fruit drinks. Fats and oils Butter, stick margarine, lard, shortening, ghee, or bacon fat. Coconut, palm kernel, and palm oils. Sweets and desserts Corn syrup, sugars, honey, and molasses. Candy. Jam and jelly. Syrup. Sweetened cereals. Cookies, pies, cakes, donuts, muffins, and ice cream. The items listed above may not be a complete list of foods and beverages youshould avoid. Contact a dietitian for more information. Summary Choosing the right foods helps keep your fat and cholesterol at normal levels. This can keep you from getting certain diseases. At meals, fill one-half of your plate with vegetables and green salads. Eat high-fiber foods, like whole grains, beans, apples, carrots, peas, and barley. Limit added sugar, saturated fats, alcohol, and fried foods. This information is not intended to replace advice given to you by your health care provider. Make  sure you discuss any questions you have with your healthcare provider. Document Revised: 10/16/2019 Document Reviewed: 10/16/2019 Elsevier Patient Education  2022 [employer].

## 2021-02-21 DIAGNOSIS — R7989 Other specified abnormal findings of blood chemistry: Secondary | ICD-10-CM | POA: Insufficient documentation

## 2021-03-08 ENCOUNTER — Other Ambulatory Visit: Payer: Self-pay

## 2021-03-08 DIAGNOSIS — F411 Generalized anxiety disorder: Secondary | ICD-10-CM

## 2021-03-08 MED ORDER — SERTRALINE HCL 100 MG PO TABS: 100.0000 mg | ORAL_TABLET | Freq: Every day | ORAL | 0 refills | Status: DC

## 2021-04-01 ENCOUNTER — Ambulatory Visit: Payer: Managed Care, Other (non HMO) | Admitting: Nurse Practitioner

## 2021-06-15 ENCOUNTER — Telehealth: Payer: Self-pay | Admitting: Nurse Practitioner

## 2021-06-15 ENCOUNTER — Other Ambulatory Visit: Payer: Self-pay

## 2021-06-15 DIAGNOSIS — F411 Generalized anxiety disorder: Secondary | ICD-10-CM

## 2021-06-15 MED ORDER — SERTRALINE HCL 100 MG PO TABS: 100.0000 mg | ORAL_TABLET | Freq: Every day | ORAL | 0 refills | Status: DC

## 2021-06-15 NOTE — Telephone Encounter (Signed)
Patient requesting refill of Zoloft and wants it sent to [facility]. Please advise. [phone]

## 2021-06-15 NOTE — Telephone Encounter (Signed)
Called pt she is advised that her Rx was sent to [facility] and agreed that she will need a appointment for her next  refill

## 2021-07-08 DIAGNOSIS — D443 Neoplasm of uncertain behavior of pituitary gland: Secondary | ICD-10-CM | POA: Diagnosis not present

## 2021-07-08 DIAGNOSIS — F5101 Primary insomnia: Secondary | ICD-10-CM | POA: Diagnosis not present

## 2021-07-08 DIAGNOSIS — E221 Hyperprolactinemia: Secondary | ICD-10-CM | POA: Diagnosis not present

## 2021-07-08 DIAGNOSIS — E039 Hypothyroidism, unspecified: Secondary | ICD-10-CM | POA: Diagnosis not present

## 2021-07-13 ENCOUNTER — Other Ambulatory Visit: Payer: Self-pay

## 2021-07-13 ENCOUNTER — Telehealth: Payer: Self-pay | Admitting: Nurse Practitioner

## 2021-07-13 DIAGNOSIS — F411 Generalized anxiety disorder: Secondary | ICD-10-CM

## 2021-07-13 MED ORDER — SERTRALINE HCL 100 MG PO TABS: 100.0000 mg | ORAL_TABLET | Freq: Every day | ORAL | 0 refills | Status: DC

## 2021-07-13 NOTE — Telephone Encounter (Signed)
Patient has an appointment scheduled to come see you Monday afternoon to get a refill of her zoloft however her grandmother passed away and will be out of town tomorrow-Sunday and will run out of medication. Can you please give her enough until then? Please advise. [phone]

## 2021-07-13 NOTE — Telephone Encounter (Signed)
Called pt VM was full sent Rx to pharmacy

## 2021-07-19 ENCOUNTER — Ambulatory Visit (INDEPENDENT_AMBULATORY_CARE_PROVIDER_SITE_OTHER): Payer: BC Managed Care – PPO | Admitting: Nurse Practitioner

## 2021-07-19 ENCOUNTER — Other Ambulatory Visit: Payer: Self-pay

## 2021-07-19 ENCOUNTER — Ambulatory Visit: Payer: Self-pay | Admitting: General Surgery

## 2021-07-19 ENCOUNTER — Encounter: Payer: Self-pay | Admitting: Nurse Practitioner

## 2021-07-19 VITALS — BP 111/71 | HR 80 | Temp 98.4°F | Ht 63.0 in | Wt 159.9 lb

## 2021-07-19 DIAGNOSIS — Z87898 Personal history of other specified conditions: Secondary | ICD-10-CM

## 2021-07-19 DIAGNOSIS — F411 Generalized anxiety disorder: Secondary | ICD-10-CM

## 2021-07-19 DIAGNOSIS — F1020 Alcohol dependence, uncomplicated: Secondary | ICD-10-CM

## 2021-07-19 DIAGNOSIS — Z6828 Body mass index (BMI) 28.0-28.9, adult: Secondary | ICD-10-CM | POA: Diagnosis not present

## 2021-07-19 DIAGNOSIS — K623 Rectal prolapse: Secondary | ICD-10-CM | POA: Diagnosis not present

## 2021-07-19 MED ORDER — SERTRALINE HCL 100 MG PO TABS: 150.0000 mg | ORAL_TABLET | Freq: Every day | ORAL | 3 refills | Status: DC

## 2021-07-19 NOTE — Progress Notes (Signed)
Established Patient Office Visit  Subjective:  Patient ID: Connie Cole, female    DOB: [birth year]  Age: 51 y.o. MRN: [id number]  CC:  Chief Complaint  Patient presents with   Medication Refill    HPI Connie Cole presents for follow up of medications. Feels as though she may be having increased anxiety. She states that she does have increased worry from time to time. She thinks that maybe her dose of sertraline needs to be increased.  States that she has cut down on her drinking a bit, but has not made up her mind to quit completely. She has saved the medication, previously prescribed, and will take this when she is really ready to quit drinking completely.  Patient is in process of being scheduled for partial hysterectomy and repair of anal prolapse that she has had.   Past Medical History:  Diagnosis Date   Arthritis    hands, hips   GERD (gastroesophageal reflux disease)    occasional diet controlled- no med   Palpitations    wore heart monitor x 1 wk, unknown - stress related    Past Surgical History:  Procedure Laterality Date   COLONOSCOPY     polyp   DILITATION & CURRETTAGE/HYSTROSCOPY WITH HYDROTHERMAL ABLATION N/A 06/03/2016   Procedure: DILATATION & CURETTAGE/HYSTEROSCOPY WITH HYDROTHERMAL ABLATION;  Surgeon: Brien Few, MD;  Location: [facility];  Service: Gynecology;  Laterality: N/A;   IR RADIOLOGIST EVAL & MGMT  09/06/2016   TONSILLECTOMY     TOTAL HIP ARTHROPLASTY Right 01/10/2019   Procedure: TOTAL HIP ARTHROPLASTY ANTERIOR APPROACH;  Surgeon: Paralee Cancel, MD;  Location: [facility];  Service: Orthopedics;  Laterality: Right;  70 mins    Family History  Problem Relation Age of Onset   Stroke Mother    Heart disease Mother    Hypertension Mother    Cancer Father        eye   Healthy Sister     Social History   Socioeconomic History   Marital status: Married    Spouse name: Not on file   Number of children: Not on file   Years of education: Not on  file   Highest education level: Not on file  Occupational History   Not on file  Tobacco Use   Smoking status: Former    Packs/day: 0.50    Years: 5.00    Pack years: 2.50    Types: Cigarettes    Quit date: 06/27/2000    Years since quitting: 21.0   Smokeless tobacco: Never  Substance and Sexual Activity   Alcohol use: Yes    Alcohol/week: 2.0 standard drinks    Types: 2 Shots of liquor per week   Drug use: No   Sexual activity: Yes    Birth control/protection: None  Other Topics Concern   Not on file  Social History Narrative   ** Merged History Encounter **       Social Determinants of Health   Financial Resource Strain: Not on file  Food Insecurity: Not on file  Transportation Needs: Not on file  Physical Activity: Not on file  Stress: Not on file  Social Connections: Not on file  Intimate Partner Violence: Not on file    Outpatient Medications Prior to Visit  Medication Sig Dispense Refill   sertraline (ZOLOFT) 100 MG tablet 1 tablet     ferrous sulfate (FERROUSUL) 325 (65 FE) MG tablet Take 1 tablet (325 mg total) by mouth 3 (three) times daily with meals  for 14 days. 42 tablet 0   naltrexone (DEPADE) 50 MG tablet Take 1 tablet (50 mg total) by mouth daily. Take 1/2 tablet po QD for first 7 days then increase to 1 tablet po QD 30 tablet 1   sertraline (ZOLOFT) 100 MG tablet Take 1 tablet (100 mg total) by mouth daily. 30 tablet 0   No facility-administered medications prior to visit.    Allergies  Allergen Reactions   Latex Swelling   Latex Rash    ROS Review of Systems  Constitutional:  Negative for activity change, appetite change, chills, fatigue and fever.  HENT:  Negative for congestion, postnasal drip, rhinorrhea, sinus pressure, sinus pain, sneezing and sore throat.   Eyes: Negative.   Respiratory:  Negative for cough, chest tightness, shortness of breath and wheezing.   Cardiovascular:  Negative for chest pain and palpitations.  Gastrointestinal:   Negative for abdominal pain, constipation, diarrhea, nausea and vomiting.  Endocrine: Negative for cold intolerance, heat intolerance, polydipsia and polyuria.  Genitourinary:  Negative for dyspareunia, dysuria, flank pain, frequency and urgency.  Musculoskeletal:  Negative for arthralgias, back pain and myalgias.  Skin:  Negative for rash.  Allergic/Immunologic: Negative for environmental allergies.  Neurological:  Negative for dizziness, weakness and headaches.  Hematological:  Negative for adenopathy.  Psychiatric/Behavioral:  Positive for dysphoric mood. The patient is nervous/anxious.      Objective:    Physical Exam Vitals and nursing note reviewed.  Constitutional:      Appearance: Normal appearance. She is well-developed.  HENT:     Head: Normocephalic and atraumatic.     Nose: Nose normal.     Mouth/Throat:     Mouth: Mucous membranes are moist.     Pharynx: Oropharynx is clear.  Eyes:     Extraocular Movements: Extraocular movements intact.     Conjunctiva/sclera: Conjunctivae normal.     Pupils: Pupils are equal, round, and reactive to light.  Cardiovascular:     Rate and Rhythm: Normal rate and regular rhythm.     Pulses: Normal pulses.     Heart sounds: Normal heart sounds.  Pulmonary:     Effort: Pulmonary effort is normal.     Breath sounds: Normal breath sounds.  Abdominal:     Palpations: Abdomen is soft.  Musculoskeletal:        General: Normal range of motion.     Cervical back: Normal range of motion and neck supple.  Lymphadenopathy:     Cervical: No cervical adenopathy.  Skin:    General: Skin is warm and dry.     Capillary Refill: Capillary refill takes less than 2 seconds.  Neurological:     General: No focal deficit present.     Mental Status: She is alert and oriented to person, place, and time.  Psychiatric:        Attention and Perception: Attention and perception normal.        Mood and Affect: Affect normal. Mood is anxious.         Speech: Speech normal.        Behavior: Behavior normal. Behavior is cooperative.        Thought Content: Thought content normal.        Cognition and Memory: Cognition and memory normal.        Judgment: Judgment normal.   Today's Vitals   07/19/21 1558  BP: 111/71  Pulse: 80  Temp: 98.4 F (36.9 C)  SpO2: 97%  Weight: 159 lb 14.4 oz (72.5  kg)  Height: '5\' 3"'  (1.6 m)   Body mass index is 28.33 kg/m.   Wt Readings from Last 3 Encounters:  07/19/21 159 lb 14.4 oz (72.5 kg)  02/16/21 165 lb 1.6 oz (74.9 kg)  01/21/21 165 lb (74.8 kg)     Health Maintenance Due  Topic Date Due   COVID-19 Vaccine (1) Never done   HIV Screening  Never done   Hepatitis C Screening  Never done   TETANUS/TDAP  Never done   COLONOSCOPY (Pts 45-87yr Insurance coverage will need to be confirmed)  Never done   MAMMOGRAM  01/10/2019   INFLUENZA VACCINE  Never done    There are no preventive care reminders to display for this patient.  Lab Results  Component Value Date   TSH 2.630 02/11/2021   Lab Results  Component Value Date   WBC 4.5 02/11/2021   HGB 10.9 (L) 02/11/2021   HCT 32.7 (L) 02/11/2021   MCV 94 02/11/2021   PLT 366 02/11/2021   Lab Results  Component Value Date   NA 139 02/11/2021   K 4.6 02/11/2021   CO2 22 02/11/2021   GLUCOSE 86 02/11/2021   BUN 13 02/11/2021   CREATININE 0.71 02/11/2021   BILITOT <0.2 02/11/2021   ALKPHOS 45 02/11/2021   AST 19 02/11/2021   ALT 15 02/11/2021   PROT 6.9 02/11/2021   ALBUMIN 4.3 02/11/2021   CALCIUM 9.7 02/11/2021   ANIONGAP 7 01/11/2019   EGFR 104 02/11/2021   Lab Results  Component Value Date   CHOL 202 (H) 02/11/2021   Lab Results  Component Value Date   HDL 92 02/11/2021   Lab Results  Component Value Date   LDLCALC 87 02/11/2021   Lab Results  Component Value Date   TRIG 139 02/11/2021   Lab Results  Component Value Date   CHOLHDL 2.2 02/11/2021   Lab Results  Component Value Date   HGBA1C 4.6 (L)  02/11/2021      Assessment & Plan:  1. GAD (generalized anxiety disorder) Patient with worsening anxiety.  We will increase sertraline to 150 mg daily.  New prescription sent to pharmacy today. - sertraline (ZOLOFT) 100 MG tablet; Take 1.5 tablets (150 mg total) by mouth daily.  Dispense: 45 tablet; Refill: 3  2. Body mass index 28.0-28.9, adult Discussed lowering calorie intake to 1500 calories per day and incorporating exercise into daily routine to help lose weight.   3. Alcohol use disorder, moderate, dependence ([place] Patient has not tried to restart naltrexone yet.  Plans to start when mind is into cutting back and stopping alcohol use..  4. H/O: pituitary tumor Patient follows up with endocrinology.  Labs improving.  Continue routine visits with endocrinology as scheduled.  Problem List Items Addressed This Visit       Endocrine   H/O: pituitary tumor     Other   GAD (generalized anxiety disorder) - Primary   Relevant Medications   sertraline (ZOLOFT) 100 MG tablet   sertraline (ZOLOFT) 100 MG tablet   Body mass index 28.0-28.9, adult   Alcohol use disorder, moderate, dependence ([place]    Meds ordered this encounter  Medications   sertraline (ZOLOFT) 100 MG tablet    Sig: Take 1.5 tablets (150 mg total) by mouth daily.    Dispense:  45 tablet    Refill:  3    Increased dose to 1539mtablets    Order Specific Question:   Supervising Provider    Answer:  METHENEY, CATHERINE D [[id number]]    Follow-up: Return in about 3 months (around 10/17/2021) for med refills. increased dose sertraline .    Ronnell Freshwater, NP  This note was dictated using Systems analyst. Rapid proofreading was performed to expedite the delivery of the information. Despite proofreading, phonetic errors will occur which are common with this voice recognition software. Please take this into consideration. If there are any concerns, please contact our office.

## 2021-07-19 NOTE — H&P (Signed)
PROVIDER:  Monico Blitz, MD  MRN: [id number] DOB: April 08, 1971 DATE OF ENCOUNTER: 07/19/2021  Subjective   Chief Complaint: anal prolapse     History of Present Illness: Connie Cole is a 51 y.o. female who is seen today as an office consultation at the request of Self for evaluation of anal prolapse .   51 year old female who presents to the office for evaluation of rectal prolapse.  She was seen in the office approximately 6 years ago and noted to have frequent rectal prolapse then.  She has a longstanding history of constipation and straining due to urinary and bowel habits, but this has improved quite a bit over the last few years due to healthy eating habits.  She denies any rectal pain or bleeding.  She is having some vaginal bleeding and it has been recommended that she undergo partial hysterectomy for this.  She endorses some occasional mild fecal incontinence at baseline.  Review of Systems: A complete review of systems was obtained from the patient.  I have reviewed this information and discussed as appropriate with the patient.  See HPI as well for other ROS.   Medical History: Past Medical History:  Diagnosis Date   Anxiety    Arthritis    Chronic back pain    Constipation    Pelvic pain in female    Pituitary tumor    Urinary incontinence    Uterine fibroid     There is no problem list on file for this patient.   Past Surgical History:  Procedure Laterality Date   COMBINED HYSTEROSCOPY DIAGNOSTIC / D&C  06/03/2016   hydrothermal ablation   06/03/2016     Allergies  Allergen Reactions   Latex Rash    Current Outpatient Medications on File Prior to Visit  Medication Sig Dispense Refill   amitriptyline (ELAVIL) 25 MG tablet 1 tab at bedtime as needed for sleep     fluticasone propionate (FLONASE) 50 mcg/actuation nasal spray 1 spray each nostril twice daily after sinus rinses     krill oil 500 mg Cap Take by mouth     sertraline (ZOLOFT) 50 MG  tablet Take by mouth     No current facility-administered medications on file prior to visit.    Family History  Problem Relation Age of Onset   High blood pressure (Hypertension) Mother    Stroke Mother    Skin cancer, non-melanoma Father      Social History   Tobacco Use  Smoking Status Former  Smokeless Tobacco Former     Social History   Socioeconomic History   Marital status: Married  Tobacco Use   Smoking status: Former   Smokeless tobacco: Former  Scientific laboratory technician Use: Never used  Substance and Sexual Activity   Alcohol use: Yes   Drug use: Never   Sexual activity: Yes    Partners: Male    Objective:    Vitals:   07/19/21 1355  BP: 118/84  Pulse: (!) 111  Temp: 36.2 C (97.2 F)  SpO2: 98%  Weight: 72 kg (158 lb 12.8 oz)  Height: 160 cm (5\' 3" )     Exam Gen: NAD CV: RRR Lungs: CTA Abd: soft, 1 cm umbilical hernia Rectal: moderate anal gape, no masses, no hemorrhoid disease.  ~4cm full thickness rectal prolapse upon sitting on the toilet    Procedure: Anoscopy Surgeon: Marcello Moores After the risks and benefits were explained, written consent was obtained for above procedure.  A medical assistant chaperone  was present thoroughout the entire procedure.  Anesthesia: none Diagnosis: rectal prolapse Findings: No hemorrhoid disease, mucosal inflammation of the right lateral distal rectal wall.   Labs, Imaging and Diagnostic Testing: Last colonoscopy 2020, f/u in 2027, Dr Collene Mares  Assessment and Plan:  Diagnoses and all orders for this visit:  Rectal prolapse  51 year old female with ongoing rectal prolapse with bowel movements.  She would like to have this corrected.  We discussed rectopexy alone versus partial colectomy and rectopexy.  We discussed the risk of resection and the risk of surgery without resection.  Patient has decided to proceed with colonic resection and rectopexy.  She would like to have this done as a combined perfused seizure with  her partial hysterectomy.  She also has a small umbilical hernia that she would like closed while she is under anesthesia.  The surgery and anatomy were described to the patient as well as the risks of surgery and the possible complications.  These include: Bleeding, deep abdominal infections and possible wound complications such as hernia and infection, damage to adjacent structures, leak of surgical connections, which can lead to other surgeries and possibly an ostomy, possible need for other procedures, such as abscess drains in radiology, possible prolonged hospital stay, possible diarrhea from removal of part of the colon, possible constipation from narcotics, possible bowel, bladder or sexual dysfunction if having rectal surgery, prolonged fatigue/weakness or appetite loss, possible early recurrence of of disease, possible complications of their medical problems such as heart disease or arrhythmias or lung problems, death (less than 1%). I believe the patient understands and wishes to proceed with the surgery.

## 2021-07-25 NOTE — Patient Instructions (Signed)
Fat and Cholesterol Restricted Eating Plan Getting too much fat and cholesterol in your diet may cause health problems. Choosing the right foods helps keep your fat and cholesterol at normal levels. This can keep you from getting certain diseases. Your doctor may recommend an eating plan that includes: Total fat: ______% or less of total calories a day. This is ______g of fat a day. Saturated fat: ______% or less of total calories a day. This is ______g of saturated fat a day. Cholesterol: less than _________mg a day. Fiber: ______g a day. What are tips for following this plan? General tips Work with your doctor to lose weight if you need to. Avoid: Foods with added sugar. Fried foods. Foods with trans fat or partially hydrogenated oils. This includes some margarines and baked goods. If you drink alcohol: Limit how much you have to: 0-1 drink a day for women who are not pregnant. 0-2 drinks a day for men. Know how much alcohol is in a drink. In the [place] one drink equals one 12 oz bottle of beer (355 mL), one 5 oz glass of wine (148 mL), or one 1 oz glass of hard liquor (44 mL). Reading food labels Check food labels for: Trans fats. Partially hydrogenated oils. Saturated fat (g) in each serving. Cholesterol (mg) in each serving. Fiber (g) in each serving. Choose foods with healthy fats, such as: Monounsaturated fats and polyunsaturated fats. These include olive and canola oil, flaxseeds, walnuts, almonds, and seeds. Omega-3 fats. These are found in certain fish, flaxseed oil, and ground flaxseeds. Choose grain products that have whole grains. Look for the word "whole" as the first word in the ingredient list. Cooking Cook foods using low-fat methods. These include baking, boiling, grilling, and broiling. Eat more home-cooked foods. Eat at restaurants and buffets less often. Eat less fast food. Avoid cooking using saturated fats, such as butter, cream, palm oil, palm kernel oil, and  coconut oil. Meal planning  At meals, divide your plate into four equal parts: Fill one-half of your plate with vegetables, green salads, and fruit. Fill one-fourth of your plate with whole grains. Fill one-fourth of your plate with low-fat (lean) protein foods. Eat fish that is high in omega-3 fats at least two times a week. This includes mackerel, tuna, sardines, and salmon. Eat foods that are high in fiber, such as whole grains, beans, apples, pears, berries, broccoli, carrots, peas, and barley. What foods should I eat? Fruits All fresh, canned (in natural juice), or frozen fruits. Vegetables Fresh or frozen vegetables (raw, steamed, roasted, or grilled). Green salads. Grains Whole grains, such as whole wheat or whole grain breads, crackers, cereals, and pasta. Unsweetened oatmeal, bulgur, barley, quinoa, or brown rice. Corn or whole wheat flour tortillas. Meats and other protein foods Ground beef (85% or leaner), grass-fed beef, or beef trimmed of fat. Skinless chicken or turkey. Ground chicken or turkey. Pork trimmed of fat. All fish and seafood. Egg whites. Dried beans, peas, or lentils. Unsalted nuts or seeds. Unsalted canned beans. Nut butters without added sugar or oil. Dairy Low-fat or nonfat dairy products, such as skim or 1% milk, 2% or reduced-fat cheeses, low-fat and fat-free ricotta or cottage cheese, or plain low-fat and nonfat yogurt. Fats and oils Tub margarine without trans fats. Light or reduced-fat mayonnaise and salad dressings. Avocado. Olive, canola, sesame, or safflower oils. The items listed above may not be a complete list of foods and beverages you can eat. Contact a dietitian for more information. What foods   should I avoid? Fruits Canned fruit in heavy syrup. Fruit in cream or butter sauce. Fried fruit. Vegetables Vegetables cooked in cheese, cream, or butter sauce. Fried vegetables. Grains White bread. White pasta. White rice. Cornbread. Bagels, pastries,  and croissants. Crackers and snack foods that contain trans fat and hydrogenated oils. Meats and other protein foods Fatty cuts of meat. Ribs, chicken wings, bacon, sausage, bologna, salami, chitterlings, fatback, hot dogs, bratwurst, and packaged lunch meats. Liver and organ meats. Whole eggs and egg yolks. Chicken and turkey with skin. Fried meat. Dairy Whole or 2% milk, cream, half-and-half, and cream cheese. Whole milk cheeses. Whole-fat or sweetened yogurt. Full-fat cheeses. Nondairy creamers and whipped toppings. Processed cheese, cheese spreads, and cheese curds. Fats and oils Butter, stick margarine, lard, shortening, ghee, or bacon fat. Coconut, palm kernel, and palm oils. Beverages Alcohol. Sugar-sweetened drinks such as sodas, lemonade, and fruit drinks. Sweets and desserts Corn syrup, sugars, honey, and molasses. Candy. Jam and jelly. Syrup. Sweetened cereals. Cookies, pies, cakes, donuts, muffins, and ice cream. The items listed above may not be a complete list of foods and beverages you should avoid. Contact a dietitian for more information. Summary Choosing the right foods helps keep your fat and cholesterol at normal levels. This can keep you from getting certain diseases. At meals, fill one-half of your plate with vegetables, green salads, and fruits. Eat high fiber foods, like whole grains, beans, apples, pears, berries, carrots, peas, and barley. Limit added sugar, saturated fats, alcohol, and fried foods. This information is not intended to replace advice given to you by your health care provider. Make sure you discuss any questions you have with your health care provider. Document Revised: 10/23/2020 Document Reviewed: 10/23/2020 Elsevier Patient Education  2022 [employer]  

## 2021-08-10 ENCOUNTER — Encounter: Payer: Self-pay | Admitting: Nurse Practitioner

## 2021-08-10 ENCOUNTER — Other Ambulatory Visit: Payer: Self-pay

## 2021-08-10 ENCOUNTER — Ambulatory Visit (INDEPENDENT_AMBULATORY_CARE_PROVIDER_SITE_OTHER): Payer: BC Managed Care – PPO | Admitting: Nurse Practitioner

## 2021-08-10 VITALS — BP 125/67 | HR 83 | Temp 97.9°F | Ht 63.0 in | Wt 160.0 lb

## 2021-08-10 DIAGNOSIS — F1020 Alcohol dependence, uncomplicated: Secondary | ICD-10-CM

## 2021-08-10 DIAGNOSIS — J3481 Nasal mucositis (ulcerative): Secondary | ICD-10-CM

## 2021-08-10 DIAGNOSIS — J0140 Acute pansinusitis, unspecified: Secondary | ICD-10-CM

## 2021-08-10 MED ORDER — MUPIROCIN 2 % EX OINT: 1.0000 "application " | TOPICAL_OINTMENT | Freq: Two times a day (BID) | CUTANEOUS | 1 refills | Status: DC

## 2021-08-10 MED ORDER — NALTREXONE HCL 50 MG PO TABS: 50.0000 mg | ORAL_TABLET | Freq: Every day | ORAL | 2 refills | Status: DC

## 2021-08-10 MED ORDER — AZITHROMYCIN 250 MG PO TABS: ORAL_TABLET | ORAL | 0 refills | Status: DC

## 2021-08-10 NOTE — Progress Notes (Signed)
Established patient visit   Patient: Connie Cole   DOB: Oct 09, 1970   51 y.o. Female  MRN: [id number] Visit Date: 08/10/2021  Chief Complaint  Patient presents with   Allergies   Subjective    HPI  The patient has had nasal dryness and drainage. This has been going on for about three weeks. She has post nasal drip. Her eyes are watering and running consistently. She has had a bit of a headache. Has tried zyrtec D, nasal spray, and eye drops every day. This is only helping a little. She denies fever, chills, or sore throat.  She states that she is in position to restart treatment for alcohol withdrawal. She has tried naltraxone in the past with some success. Would like to try this again.   Medications: Outpatient Medications Prior to Visit  Medication Sig   sertraline (ZOLOFT) 100 MG tablet Take 1.5 tablets (150 mg total) by mouth daily.   sertraline (ZOLOFT) 100 MG tablet 1 tablet   No facility-administered medications prior to visit.    Review of Systems  Constitutional:  Positive for fatigue. Negative for activity change, appetite change, chills and fever.  HENT:  Positive for congestion, postnasal drip, rhinorrhea, sinus pressure, sinus pain and sore throat. Negative for sneezing.   Eyes:  Positive for redness.       Eye irritation and excessive tearing.   Respiratory:  Positive for cough. Negative for chest tightness, shortness of breath and wheezing.   Cardiovascular:  Negative for chest pain and palpitations.  Gastrointestinal:  Negative for abdominal pain, constipation, diarrhea, nausea and vomiting.  Endocrine: Negative for cold intolerance, heat intolerance, polydipsia and polyuria.  Genitourinary:  Negative for dyspareunia, dysuria, flank pain, frequency and urgency.  Musculoskeletal:  Negative for arthralgias, back pain and myalgias.  Skin:  Negative for rash.  Allergic/Immunologic: Negative for environmental allergies.  Neurological:  Negative for dizziness, weakness  and headaches.  Hematological:  Negative for adenopathy.  Psychiatric/Behavioral:  Positive for dysphoric mood. The patient is nervous/anxious.     Objective     Today's Vitals   08/10/21 1511  BP: 125/67  Pulse: 83  Temp: 97.9 F (36.6 C)  SpO2: 99%  Weight: 160 lb (72.6 kg)  Height: 5\' 3"  (1.6 m)   Body mass index is 28.34 kg/m.   Physical Exam Vitals and nursing note reviewed.  Constitutional:      Appearance: Normal appearance. She is well-developed.  HENT:     Head: Normocephalic and atraumatic.     Right Ear: Tympanic membrane is bulging.     Left Ear: Tympanic membrane is bulging.     Nose: Mucosal edema and congestion present.     Right Turbinates: Swollen.     Left Turbinates: Swollen.     Right Sinus: Maxillary sinus tenderness and frontal sinus tenderness present.     Left Sinus: Maxillary sinus tenderness and frontal sinus tenderness present.     Mouth/Throat:     Pharynx: Posterior oropharyngeal erythema present.  Eyes:     Pupils: Pupils are equal, round, and reactive to light.  Cardiovascular:     Rate and Rhythm: Normal rate and regular rhythm.     Pulses: Normal pulses.     Heart sounds: Normal heart sounds.  Pulmonary:     Effort: Pulmonary effort is normal.     Breath sounds: Normal breath sounds.  Abdominal:     Palpations: Abdomen is soft.  Musculoskeletal:        General: Normal range of  motion.     Cervical back: Normal range of motion and neck supple.  Lymphadenopathy:     Cervical: Cervical adenopathy present.  Skin:    General: Skin is warm and dry.     Capillary Refill: Capillary refill takes less than 2 seconds.  Neurological:     General: No focal deficit present.     Mental Status: She is alert and oriented to person, place, and time.  Psychiatric:        Mood and Affect: Mood normal.        Behavior: Behavior normal.        Thought Content: Thought content normal.        Judgment: Judgment normal.      Assessment & Plan     1. Acute non-recurrent pansinusitis Start z-pack. Take as directed for 5 days. Rest and increase fluids. Continue using OTC medication to control symptoms.   - azithromycin (ZITHROMAX) 250 MG tablet; z-pack - take as directed for 5 days  Dispense: 6 tablet; Refill: 0  2. Nasal mucositis (ulcerative) Apply bactroban ointment to nasal mucosa twice daily for next 7 days.  - mupirocin ointment (BACTROBAN) 2 %; Place 1 application into the nose 2 (two) times daily.  Dispense: 22 g; Refill: 1  3. Alcohol use disorder, moderate, dependence (HCC) Restart naltexone 50mg  tablets daily. Reassess at next scheduled visit.  - naltrexone (DEPADE) 50 MG tablet; Take 1 tablet (50 mg total) by mouth daily.  Dispense: 30 tablet; Refill: 2   Return for as scheduled.        Ronnell Freshwater, NP  [facility] at [facility] [phone] (phone) [phone] (fax)  [place]

## 2021-08-21 DIAGNOSIS — J0140 Acute pansinusitis, unspecified: Secondary | ICD-10-CM | POA: Insufficient documentation

## 2021-08-21 DIAGNOSIS — J3481 Nasal mucositis (ulcerative): Secondary | ICD-10-CM | POA: Insufficient documentation

## 2021-09-29 NOTE — Patient Instructions (Addendum)
DUE TO COVID-19 ONLY ONE VISITOR  (aged 51 and older)  IS ALLOWED TO COME WITH YOU AND STAY IN THE WAITING ROOM ONLY DURING PRE OP AND PROCEDURE.   ?**NO VISITORS ARE ALLOWED IN THE SHORT STAY AREA OR RECOVERY ROOM!!** ? ?IF YOU WILL BE ADMITTED INTO THE HOSPITAL YOU ARE ALLOWED ONLY TWO SUPPORT PEOPLE DURING VISITATION HOURS ONLY (7 AM -8PM)   ?The support person(s) must pass our screening, gel in and out, and wear a mask at all times, including in the patient?s room. ?Patients must also wear a mask when staff or their support person are in the room. ?Visitors GUEST BADGE MUST BE WORN VISIBLY  ?One adult visitor may remain with you overnight and MUST be in the room by 8 P.M. ?  ? ? Your procedure is scheduled on: 10/08/21 ? ? Report to [facility] Main Entrance ? ?  Report to Short Stay at : 5:15 AM ? ? Call this number if you have problems the morning of surgery [phone] ? ?  ? ? [place] until : 4:30 AM DAY OF SURGERY. DRINK PLENTY FLUIDS THE DAY OF THE PREP. ? ?Water ?Black Coffee (sugar ok, NO MILK/CREAM OR CREAMERS)  ?Tea (sugar ok, NO MILK/CREAM OR CREAMERS) regular and decaf                             ?Plain Jell-O (NO RED)                                           ?Fruit ices (not with fruit pulp, NO RED)                                     ?Popsicles (NO RED)                                                                  ?Juice: apple, WHITE grape, WHITE cranberry ?Sports drinks like Gatorade (NO RED) ?Clear broth(vegetable,chicken,beef) ?            ?DRINK 2 PRESURGERY ENSURE DRINKS THE NIGHT BEFORE SURGERY AT ? 1000 PM AND 1 PRESURGERY DRINK THE DAY OF THE PROCEDURE 3 HOURS PRIOR TO SCHEDULED SURGERY. NO SOLIDS AFTER MIDNIGHT THE DAY PRIOR TO THE SURGERY. NOTHING BY MOUTH EXCEPT CLEAR LIQUIDS UNTIL THREE HOURS PRIOR TO SCHEDULED SURGERY. PLEASE FINISH PRESURGERY ENSURE DRINK PER SURGEON ORDER 3 HOURS PRIOR TO SCHEDULED SURGERY TIME WHICH NEEDS TO BE  COMPLETED AT : 4:30 AM.  ?  ?The day of surgery:  ?Drink ONE (1) Pre-Surgery Clear Ensure or G2 at AM the morning of surgery. Drink in one sitting. Do not sip.  ?This drink was given to you during your hospital  ?pre-op appointment visit. ?Nothing else to drink after completing the  ?Pre-Surgery Clear Ensure or G2. ?  ?       If you have questions, please contact your surgeon?s office. ? ?FOLLOW BOWEL PREP AND ANY ADDITIONAL PRE OP INSTRUCTIONS YOU RECEIVED FROM  YOUR SURGEON'S OFFICE!!! ?  ?Oral Hygiene is also important to reduce your risk of infection.                                    ?Remember - BRUSH YOUR TEETH THE MORNING OF SURGERY WITH YOUR REGULAR TOOTHPASTE ? ? Do NOT smoke after Midnight ? ? Take these medicines the morning of surgery with A SIP OF WATER: sertraline ? ?DO NOT TAKE ANY ORAL DIABETIC MEDICATIONS DAY OF YOUR SURGERY ? ?Bring CPAP mask and tubing day of surgery. ?                  ?           You may not have any metal on your body including hair pins, jewelry, and body piercing ? ?           Do not wear make-up, lotions, powders, perfumes/cologne, or deodorant ? ?Do not wear nail polish including gel and S&S, artificial/acrylic nails, or any other type of covering on natural nails including finger and toenails. If you have artificial nails, gel coating, etc. that needs to be removed by a nail salon please have this removed prior to surgery or surgery may need to be canceled/ delayed if the surgeon/ anesthesia feels like they are unable to be safely monitored.  ? ?Do not shave  48 hours prior to surgery. ? ? Do not bring valuables to the hospital. [facility] NOT ?            RESPONSIBLE   FOR VALUABLES. ? ? Contacts, dentures or bridgework may not be worn into surgery. ? ? Bring small overnight bag day of surgery. ?  ? Patients discharged on the day of surgery will not be allowed to drive home.  Someone NEEDS to stay with you for the first 24 hours after anesthesia. ? ? Special  Instructions: Bring a copy of your healthcare power of attorney and living will documents         the day of surgery if you haven't scanned them before. ? ?            Please read over the following fact sheets you were given: IF [place] [phone] ? ?   [place] - Preparing for Surgery ?Before surgery, you can play an important role.  Because skin is not sterile, your skin needs to be as free of germs as possible.  You can reduce the number of germs on your skin by washing with CHG (chlorahexidine gluconate) soap before surgery.  CHG is an antiseptic cleaner which kills germs and bonds with the skin to continue killing germs even after washing. ?Please DO NOT use if you have an allergy to CHG or antibacterial soaps.  If your skin becomes reddened/irritated stop using the CHG and inform your nurse when you arrive at Short Stay. ?Do not shave (including legs and underarms) for at least 48 hours prior to the first CHG shower.  You may shave your face/neck. ?Please follow these instructions carefully: ? 1.  Shower with CHG Soap the night before surgery and the  morning of Surgery. ? 2.  If you choose to wash your hair, wash your hair first as usual with your  normal  shampoo. ? 3.  After you shampoo, rinse your hair and body thoroughly to remove the  shampoo.                           4.  Use CHG as you would any other liquid soap.  You can apply chg directly  to the skin and wash  ?                     Gently with a scrungie or clean washcloth. ? 5.  Apply the CHG Soap to your body ONLY FROM THE NECK DOWN.   Do not use on face/ open      ?                     Wound or open sores. Avoid contact with eyes, ears mouth and genitals (private parts).  ?                     Production manager,  Genitals (private parts) with your normal soap. ?            6.  Wash thoroughly, paying special attention to the area where your surgery  will be performed. ? 7.  Thoroughly rinse  your body with warm water from the neck down. ? 8.  DO NOT shower/wash with your normal soap after using and rinsing off  the CHG Soap. ?               9.  Pat yourself dry with a clean towel. ?           10.  Wear clean pajamas. ?           11.  Place clean sheets on your bed the night of your first shower and do not  sleep with pets. ?Day of Surgery : ?Do not apply any lotions/deodorants the morning of surgery.  Please wear clean clothes to the hospital/surgery center. ? ?FAILURE TO FOLLOW THESE INSTRUCTIONS MAY RESULT IN THE CANCELLATION OF YOUR SURGERY ?PATIENT SIGNATURE_________________________________ ? ?NURSE SIGNATURE__________________________________ ? ?________________________________________________________________________  ? ?Incentive Spirometer ? ?An incentive spirometer is a tool that can help keep your lungs clear and active. This tool measures how well you are filling your lungs with each breath. Taking long deep breaths may help reverse or decrease the chance of developing breathing (pulmonary) problems (especially infection) following: ?A long period of time when you are unable to move or be active. ?BEFORE THE PROCEDURE  ?If the spirometer includes an indicator to show your best effort, your nurse or respiratory therapist will set it to a desired goal. ?If possible, sit up straight or lean slightly forward. Try not to slouch. ?Hold the incentive spirometer in an upright position. ?INSTRUCTIONS FOR USE  ?Sit on the edge of your bed if possible, or sit up as far as you can in bed or on a chair. ?Hold the incentive spirometer in an upright position. ?Breathe out normally. ?Place the mouthpiece in your mouth and seal your lips tightly around it. ?Breathe in slowly and as deeply as possible, raising the piston or the ball toward the top of the column. ?Hold your breath for 3-5 seconds or for as long as possible. Allow the piston or ball to fall to the bottom of the column. ?Remove the mouthpiece from  your mouth and breathe out normally. ?Rest for a few seconds and repeat Steps 1 through 7 at least 10 times every 1-2 hours when you are awake. Take your time and take  a few normal breaths between deep breaths. ?The

## 2021-09-30 ENCOUNTER — Other Ambulatory Visit: Payer: Self-pay | Admitting: Obstetrics and Gynecology

## 2021-09-30 ENCOUNTER — Other Ambulatory Visit: Payer: Self-pay

## 2021-09-30 ENCOUNTER — Encounter (HOSPITAL_COMMUNITY): Payer: Self-pay

## 2021-09-30 ENCOUNTER — Encounter (HOSPITAL_COMMUNITY)
Admission: RE | Admit: 2021-09-30 | Discharge: 2021-09-30 | Disposition: A | Discharge status: Home / Self Care | Payer: BC Managed Care – PPO | Source: Ambulatory Visit | Attending: General Surgery | Admitting: General Surgery

## 2021-09-30 VITALS — BP 122/89 | HR 79 | Temp 98.7°F | Ht 63.0 in | Wt 159.0 lb

## 2021-09-30 DIAGNOSIS — Z01818 Encounter for other preprocedural examination: Secondary | ICD-10-CM | POA: Insufficient documentation

## 2021-09-30 DIAGNOSIS — K429 Umbilical hernia without obstruction or gangrene: Secondary | ICD-10-CM | POA: Diagnosis not present

## 2021-09-30 DIAGNOSIS — D649 Anemia, unspecified: Secondary | ICD-10-CM | POA: Insufficient documentation

## 2021-09-30 DIAGNOSIS — R002 Palpitations: Secondary | ICD-10-CM | POA: Diagnosis not present

## 2021-09-30 DIAGNOSIS — D499 Neoplasm of unspecified behavior of unspecified site: Secondary | ICD-10-CM | POA: Diagnosis not present

## 2021-09-30 DIAGNOSIS — F1090 Alcohol use, unspecified, uncomplicated: Secondary | ICD-10-CM | POA: Diagnosis not present

## 2021-09-30 DIAGNOSIS — K623 Rectal prolapse: Secondary | ICD-10-CM | POA: Insufficient documentation

## 2021-09-30 HISTORY — DX: Neoplasm of unspecified behavior of endocrine glands and other parts of nervous system: D49.7

## 2021-09-30 HISTORY — DX: Depression, unspecified: F32.A

## 2021-09-30 HISTORY — DX: Anxiety disorder, unspecified: F41.9

## 2021-09-30 HISTORY — DX: Anemia, unspecified: D64.9

## 2021-09-30 LAB — BASIC METABOLIC PANEL
Anion gap: 7 (ref 5–15)
BUN: 15 mg/dL (ref 6–20)
CO2: 28 mmol/L (ref 22–32)
Calcium: 9.4 mg/dL (ref 8.9–10.3)
Chloride: 104 mmol/L (ref 98–111)
Creatinine, Ser: 0.69 mg/dL (ref 0.44–1.00)
GFR, Estimated: >60 mL/min (ref >60)
Glucose, Bld: 105 mg/dL — ABNORMAL HIGH (ref 70–99)
Potassium: 4.1 mmol/L (ref 3.5–5.1)
Sodium: 139 mmol/L (ref 135–145)

## 2021-09-30 LAB — CBC
HCT: 30.7 % — ABNORMAL LOW (ref 36.0–46.0)
Hemoglobin: 9.4 g/dL — ABNORMAL LOW (ref 12.0–15.0)
MCH: 26.2 pg (ref 26.0–34.0)
MCHC: 30.6 g/dL (ref 30.0–36.0)
MCV: 85.5 fL (ref 80.0–100.0)
Platelets: 444 10*3/uL — ABNORMAL HIGH (ref 150–400)
RBC: 3.59 MIL/uL — ABNORMAL LOW (ref 3.87–5.11)
RDW: 17.2 % — ABNORMAL HIGH (ref 11.5–15.5)
WBC: 4.3 10*3/uL (ref 4.0–10.5)
nRBC: 0 % (ref 0.0–0.2)

## 2021-09-30 NOTE — Progress Notes (Signed)
Lab. Report: Hemoglobin: 9.4 ?HCT: 30.7 ?

## 2021-09-30 NOTE — Progress Notes (Addendum)
For Short Stay: ?[facility] appointment date: ?Date of COVID positive in last 90 days: ?COVID Vaccine: NO ?Bowel Prep reminder: Reviewed  ? ? ?For Anesthesia: ?PCP - Leretha Pol: NP ?Cardiologist - NO ? ?Chest x-ray - CT Chest: 06/01/21 ?EKG -  ?Stress Test -  ?ECHO -  ?Cardiac Cath -  ?Pacemaker/ICD device last checked: ?Pacemaker orders received: ?Device Rep notified: ? ?Spinal Cord Stimulator: ? ?Sleep Study -  ?CPAP -  ? ?Fasting Blood Sugar -  ?Checks Blood Sugar _____ times a day ?Date and result of last Hgb A1c- ? ?Blood Thinner Instructions: ?Aspirin Instructions: ?Last Dose: ? ?Activity level: Can go up a flight of stairs and activities of daily living without stopping and without chest pain and/or shortness of breath ?  Able to exercise without chest pain and/or shortness of breath ?  Unable to go up a flight of stairs without chest pain and/or shortness of breath ?   ? ?Anesthesia review: Hx: Palpitations ? ?Patient denies shortness of breath, fever, cough and chest pain at PAT appointment ? ? ?Patient verbalized understanding of instructions that were given to them at the PAT appointment. Patient was also instructed that they will need to review over the PAT instructions again at home before surgery.  ?

## 2021-10-04 ENCOUNTER — Encounter (HOSPITAL_COMMUNITY): Payer: Self-pay

## 2021-10-04 NOTE — Anesthesia Preprocedure Evaluation (Addendum)
Anesthesia Evaluation  ?Patient identified by MRN, date of birth, ID band ?Patient awake ? ? ? ?Reviewed: ?Allergy & Precautions, NPO status , Patient's Chart, lab work & pertinent test results ? ?History of Anesthesia Complications ?Negative for: history of anesthetic complications ? ?Airway ?Mallampati: II ? ?TM Distance: >3 FB ?Neck ROM: Full ? ? ? Dental ?no notable dental hx. ?(+) Dental Advisory Given ?  ?Pulmonary ?neg pulmonary ROS, former smoker,  ?  ?Pulmonary exam normal ? ? ? ? ? ? ? Cardiovascular ?negative cardio ROS ?Normal cardiovascular exam ? ? ?  ?Neuro/Psych ?PSYCHIATRIC DISORDERS Anxiety Depression negative neurological ROS ?   ? GI/Hepatic ?Neg liver ROS, GERD  ,  ?Endo/Other  ?Hypothyroidism  ? Renal/GU ?negative Renal ROS  ? ?  ?Musculoskeletal ?negative musculoskeletal ROS ?(+)  ? Abdominal ?  ?Peds ? Hematology ? ?(+) Blood dyscrasia, anemia ,   ?Anesthesia Other Findings ? ? Reproductive/Obstetrics ? ?  ? ? ? ? ? ? ? ? ? ? ? ? ? ?  ?  ? ? ? ? ? ? ?Anesthesia Physical ?Anesthesia Plan ? ?ASA: 2 ? ?Anesthesia Plan: General  ? ?Post-op Pain Management: Celebrex PO (pre-op)* and Tylenol PO (pre-op)*  ? ?Induction:  ? ?PONV Risk Score and Plan: 4 or greater and Ondansetron, Dexamethasone, Midazolam, Scopolamine patch - Pre-op and Propofol infusion ? ?Airway Management Planned: Oral ETT ? ?Additional Equipment:  ? ?Intra-op Plan:  ? ?Post-operative Plan: Extubation in OR ? ?Informed Consent: I have reviewed the patients History and Physical, chart, labs and discussed the procedure including the risks, benefits and alternatives for the proposed anesthesia with the patient or authorized representative who has indicated his/her understanding and acceptance.  ? ? ? ?Dental advisory given ? ?Plan Discussed with: Anesthesiologist and CRNA ? ?Anesthesia Plan Comments: (See APP note by Durel Salts, FNP )  ? ? ? ? ?Anesthesia Quick Evaluation ? ?

## 2021-10-04 NOTE — Progress Notes (Signed)
Anesthesia Chart Review: ? ? Case: [id number] Date/Time: 10/08/21 0715  ? Procedures:  ?    XI ROBOT ASSISTED LAPAROSCOPIC PARTIAL COLECTOMY ?    RECTOPEXY ?    HERNIA REPAIR UMBILICAL ADULT ?    XI ROBOTIC ASSISTED LAPAROSCOPIC HYSTERECTOMY AND SALPINGECTOMY (Bilateral)  ? Anesthesia type: General  ? Pre-op diagnosis: rectal prolapse umbilical hernia;MENORAGIA  ? Location: [facility] ROOM 02 / [facility]  ? Surgeons: Leighton Ruff, MD; Brien Few, MD  ? ?  ? ? ?DISCUSSION: ?Pt is 51 years old with hx palpitations (normal cardiac monitor in 2017), anemia, pituitary tumor, alcohol use disorder ? ?- Hgb 9.4. Was 10.9 in 01/2021. From Dr. Marcello Moores' H&P, pt is having heavy vaginal bleeding and will be undergoing hysterectomy as well as partial colectomy and rectopexy day of surgery ? ?Reviewed anemia with Dr. Ambrose Pancoast and Dr. Lanetta Inch. T&S already done. Placed orders to prepare 2 units PRBCs for day of surgery.  ? ? ?VS: BP 122/89   Pulse 79   Temp 37.1 ?C (Oral)   Ht '5\' 3"'$  (1.6 m)   Wt 72.1 kg   LMP 09/22/2021   SpO2 97%   BMI 28.17 kg/m?  ? ?PROVIDERS: ?- PCP is Ronnell Freshwater, NP ? ? ?LABS:  ?- Hgb 9.4. Was 10.9 in 01/2021.  ? ?(all labs ordered are listed, but only abnormal results are displayed) ? ?Labs Reviewed  ?CBC - Abnormal; Notable for the following components:  ?    Result Value  ? RBC 3.59 (*)   ? Hemoglobin 9.4 (*)   ? HCT 30.7 (*)   ? RDW 17.2 (*)   ? Platelets 444 (*)   ? All other components within normal limits  ?BASIC METABOLIC PANEL - Abnormal; Notable for the following components:  ? Glucose, Bld 105 (*)   ? All other components within normal limits  ?TYPE AND SCREEN  ? ? ? ?EKG 09/30/21: NSR. Low voltage QRS. Cannot rule out Anterior infarct , age undetermined ? ? ? ?CV: ?Cardiac event monitor 02/01/16:  ?NSR ?No serious arrhythmias ?  ? ?Past Medical History:  ?Diagnosis Date  ? Anemia   ? Anxiety   ? Arthritis   ? hands, hips  ? Depression   ? GERD (gastroesophageal reflux disease)   ? occasional diet  controlled- no med  ? Palpitations   ? normal heart monitor x 1 wk in 2017  ? Pituitary tumor   ? ? ?Past Surgical History:  ?Procedure Laterality Date  ? COLONOSCOPY    ? polyp  ? DILITATION & CURRETTAGE/HYSTROSCOPY WITH HYDROTHERMAL ABLATION N/A 06/03/2016  ? Procedure: DILATATION & CURETTAGE/HYSTEROSCOPY WITH HYDROTHERMAL ABLATION;  Surgeon: Brien Few, MD;  Location: [facility];  Service: Gynecology;  Laterality: N/A;  ? IR RADIOLOGIST EVAL & MGMT  09/06/2016  ? TONSILLECTOMY    ? TOTAL HIP ARTHROPLASTY Right 01/10/2019  ? Procedure: TOTAL HIP ARTHROPLASTY ANTERIOR APPROACH;  Surgeon: Paralee Cancel, MD;  Location: [facility];  Service: Orthopedics;  Laterality: Right;  70 mins  ? ? ?MEDICATIONS: ? KRILL OIL PO  ? MAGNESIUM PO  ? mupirocin ointment (BACTROBAN) 2 %  ? naltrexone (DEPADE) 50 MG tablet  ? OVER THE COUNTER MEDICATION  ? OVER THE COUNTER MEDICATION  ? sertraline (ZOLOFT) 100 MG tablet  ? ?No current facility-administered medications for this encounter.  ? ? ?If no changes, I anticipate pt can proceed with surgery as scheduled.  ? ?Willeen Cass, PhD, FNP-BC ?[facility]/Anesthesiology ?Phone: [phone] ?10/04/2021  11:23 AM ? ? ? ? ? ? ?

## 2021-10-07 NOTE — H&P (Addendum)
Connie Cole is an 51 y.o. female. AUB with failed ablation. ? ?Pertinent Gynecological History: ?Menses: with severe dysmenorrhea ?Bleeding: dysfunctional uterine bleeding ?Contraception: none ?DES exposure: denies ?Blood transfusions: none ?Sexually transmitted diseases: no past history ?Previous GYN Procedures: DNC  ?Last mammogram: normal Date: 2023 ?Last pap: normal Date: 2023 ?OB History: G2, P0  ? ?Menstrual History: ?Menarche age: 37 ?Patient's last menstrual period was 09/22/2021. ?  ? ?Past Medical History:  ?Diagnosis Date  ? Anemia   ? Anxiety   ? Arthritis   ? hands, hips  ? Depression   ? GERD (gastroesophageal reflux disease)   ? occasional diet controlled- no med  ? Palpitations   ? normal heart monitor x 1 wk in 2017  ? Pituitary tumor   ? ? ?Past Surgical History:  ?Procedure Laterality Date  ? COLONOSCOPY    ? polyp  ? DILITATION & CURRETTAGE/HYSTROSCOPY WITH HYDROTHERMAL ABLATION N/A 06/03/2016  ? Procedure: DILATATION & CURETTAGE/HYSTEROSCOPY WITH HYDROTHERMAL ABLATION;  Surgeon: Brien Few, MD;  Location: [facility];  Service: Gynecology;  Laterality: N/A;  ? IR RADIOLOGIST EVAL & MGMT  09/06/2016  ? TONSILLECTOMY    ? TOTAL HIP ARTHROPLASTY Right 01/10/2019  ? Procedure: TOTAL HIP ARTHROPLASTY ANTERIOR APPROACH;  Surgeon: Paralee Cancel, MD;  Location: [facility];  Service: Orthopedics;  Laterality: Right;  70 mins  ? ? ?Family History  ?Problem Relation Age of Onset  ? Stroke Mother   ? Heart disease Mother   ? Hypertension Mother   ? Cancer Father   ?     eye  ? Healthy Sister   ? ? ?Social History:  reports that she quit smoking about 21 years ago. Her smoking use included cigarettes. She has a 2.50 pack-year smoking history. She has never used smokeless tobacco. She reports current alcohol use of about 2.0 - 3.0 standard drinks per week. She reports that she does not use drugs. ? ?Allergies:  ?Allergies  ?Allergen Reactions  ? Latex Swelling  ? Latex Rash  ? ? ?No medications prior to admission.   ? ? ?Review of Systems  ?Constitutional: Negative.   ?All other systems reviewed and are negative. ? ?Last menstrual period 09/22/2021. ?Physical Exam ?Constitutional:   ?   Appearance: Normal appearance. She is normal weight.  ?HENT:  ?   Head: Normocephalic and atraumatic.  ?Cardiovascular:  ?   Rate and Rhythm: Normal rate and regular rhythm.  ?   Pulses: Normal pulses.  ?   Heart sounds: Normal heart sounds.  ?Pulmonary:  ?   Effort: Pulmonary effort is normal.  ?   Breath sounds: Normal breath sounds.  ?Abdominal:  ?   General: Abdomen is flat. Bowel sounds are normal.  ?   Palpations: Abdomen is soft.  ?Genitourinary: ?   General: Normal vulva.  ?Musculoskeletal:     ?   General: Normal range of motion.  ?   Cervical back: Normal range of motion and neck supple.  ?Skin: ?   General: Skin is warm.  ?Neurological:  ?   General: No focal deficit present.  ?   Mental Status: She is alert.  ? ? ?No results found for this or any previous visit (from the past 24 hour(s)). ? ?No results found. ? ?Assessment/Plan: ?AUB with failed EAB ?Likely adenomyosis ?daVinci TLH and bilateral salpingectomy ? ?Desean Heemstra J ?10/07/2021, 7:06 PM ? ?

## 2021-10-08 ENCOUNTER — Encounter (HOSPITAL_COMMUNITY): Payer: Self-pay | Admitting: Obstetrics and Gynecology

## 2021-10-08 ENCOUNTER — Other Ambulatory Visit: Payer: Self-pay

## 2021-10-08 ENCOUNTER — Ambulatory Visit (HOSPITAL_COMMUNITY): Payer: BC Managed Care – PPO | Admitting: Emergency Medicine

## 2021-10-08 ENCOUNTER — Inpatient Hospital Stay (HOSPITAL_COMMUNITY)
Admission: RE | Admit: 2021-10-08 | Discharge: 2021-10-11 | DRG: 330 | Disposition: A | Discharge status: Home / Self Care | Payer: BC Managed Care – PPO | Attending: General Surgery | Admitting: General Surgery

## 2021-10-08 ENCOUNTER — Encounter (HOSPITAL_COMMUNITY)
Admission: RE | Disposition: A | Discharge status: Home / Self Care | Payer: Self-pay | Source: Home / Self Care | Attending: General Surgery

## 2021-10-08 DIAGNOSIS — Z91040 Latex allergy status: Secondary | ICD-10-CM | POA: Diagnosis not present

## 2021-10-08 DIAGNOSIS — N920 Excessive and frequent menstruation with regular cycle: Secondary | ICD-10-CM | POA: Diagnosis present

## 2021-10-08 DIAGNOSIS — N838 Other noninflammatory disorders of ovary, fallopian tube and broad ligament: Secondary | ICD-10-CM | POA: Diagnosis not present

## 2021-10-08 DIAGNOSIS — D500 Iron deficiency anemia secondary to blood loss (chronic): Secondary | ICD-10-CM | POA: Diagnosis not present

## 2021-10-08 DIAGNOSIS — Z01818 Encounter for other preprocedural examination: Secondary | ICD-10-CM

## 2021-10-08 DIAGNOSIS — M160 Bilateral primary osteoarthritis of hip: Secondary | ICD-10-CM | POA: Diagnosis not present

## 2021-10-08 DIAGNOSIS — Z87891 Personal history of nicotine dependence: Secondary | ICD-10-CM | POA: Diagnosis not present

## 2021-10-08 DIAGNOSIS — N736 Female pelvic peritoneal adhesions (postinfective): Secondary | ICD-10-CM | POA: Diagnosis not present

## 2021-10-08 DIAGNOSIS — F419 Anxiety disorder, unspecified: Secondary | ICD-10-CM | POA: Diagnosis not present

## 2021-10-08 DIAGNOSIS — M19042 Primary osteoarthritis, left hand: Secondary | ICD-10-CM | POA: Diagnosis not present

## 2021-10-08 DIAGNOSIS — F32A Depression, unspecified: Secondary | ICD-10-CM | POA: Diagnosis present

## 2021-10-08 DIAGNOSIS — K219 Gastro-esophageal reflux disease without esophagitis: Secondary | ICD-10-CM | POA: Diagnosis present

## 2021-10-08 DIAGNOSIS — K623 Rectal prolapse: Principal | ICD-10-CM | POA: Diagnosis present

## 2021-10-08 DIAGNOSIS — K920 Hematemesis: Secondary | ICD-10-CM | POA: Diagnosis not present

## 2021-10-08 DIAGNOSIS — D62 Acute posthemorrhagic anemia: Secondary | ICD-10-CM | POA: Diagnosis not present

## 2021-10-08 DIAGNOSIS — M19041 Primary osteoarthritis, right hand: Secondary | ICD-10-CM | POA: Diagnosis present

## 2021-10-08 DIAGNOSIS — D649 Anemia, unspecified: Principal | ICD-10-CM

## 2021-10-08 DIAGNOSIS — N8003 Adenomyosis of the uterus: Secondary | ICD-10-CM | POA: Diagnosis present

## 2021-10-08 DIAGNOSIS — N8301 Follicular cyst of right ovary: Secondary | ICD-10-CM | POA: Diagnosis present

## 2021-10-08 DIAGNOSIS — K429 Umbilical hernia without obstruction or gangrene: Secondary | ICD-10-CM | POA: Diagnosis present

## 2021-10-08 DIAGNOSIS — N938 Other specified abnormal uterine and vaginal bleeding: Secondary | ICD-10-CM | POA: Diagnosis not present

## 2021-10-08 HISTORY — PX: RECTOPEXY: SHX5700

## 2021-10-08 HISTORY — PX: ROBOTIC ASSISTED LAPAROSCOPIC HYSTERECTOMY AND SALPINGECTOMY: SHX6379

## 2021-10-08 HISTORY — PX: UMBILICAL HERNIA REPAIR: SHX196

## 2021-10-08 LAB — BASIC METABOLIC PANEL
Anion gap: 8 (ref 5–15)
BUN: 5 mg/dL — ABNORMAL LOW (ref 6–20)
CO2: 25 mmol/L (ref 22–32)
Calcium: 8.8 mg/dL — ABNORMAL LOW (ref 8.9–10.3)
Chloride: 105 mmol/L (ref 98–111)
Creatinine, Ser: 0.49 mg/dL (ref 0.44–1.00)
GFR, Estimated: >60 mL/min (ref >60)
Glucose, Bld: 148 mg/dL — ABNORMAL HIGH (ref 70–99)
Potassium: 2.9 mmol/L — ABNORMAL LOW (ref 3.5–5.1)
Sodium: 138 mmol/L (ref 135–145)

## 2021-10-08 LAB — CBC
HCT: 30 % — ABNORMAL LOW (ref 36.0–46.0)
Hemoglobin: 9.4 g/dL — ABNORMAL LOW (ref 12.0–15.0)
MCH: 26.3 pg (ref 26.0–34.0)
MCHC: 31.3 g/dL (ref 30.0–36.0)
MCV: 84 fL (ref 80.0–100.0)
Platelets: 386 10*3/uL (ref 150–400)
RBC: 3.57 MIL/uL — ABNORMAL LOW (ref 3.87–5.11)
RDW: 17.1 % — ABNORMAL HIGH (ref 11.5–15.5)
WBC: 6.2 10*3/uL (ref 4.0–10.5)
nRBC: 0 % (ref 0.0–0.2)

## 2021-10-08 LAB — PREGNANCY, URINE: Preg Test, Ur: NEGATIVE

## 2021-10-08 LAB — PREPARE RBC (CROSSMATCH)

## 2021-10-08 SURGERY — COLECTOMY, PARTIAL, ROBOT-ASSISTED, LAPAROSCOPIC
Anesthesia: General

## 2021-10-08 MED ORDER — ENSURE PRE-SURGERY PO LIQD: 296.0000 mL | Freq: Once | ORAL | Status: DC

## 2021-10-08 MED ORDER — ALVIMOPAN 12 MG PO CAPS
12.0000 mg | ORAL_CAPSULE | ORAL | Status: AC
  Administered 2021-10-08: 12 mg via ORAL
  Filled 2021-10-08: qty 1

## 2021-10-08 MED ORDER — BUPIVACAINE HCL (PF) 0.25 % IJ SOLN
INTRAMUSCULAR | Status: AC
  Filled 2021-10-08: qty 30

## 2021-10-08 MED ORDER — CEFAZOLIN SODIUM-DEXTROSE 2-4 GM/100ML-% IV SOLN
2.0000 g | INTRAVENOUS | Status: DC
Start: 2021-10-08 — End: 2021-10-08
  Filled 2021-10-08: qty 100

## 2021-10-08 MED ORDER — HEPARIN SODIUM (PORCINE) 5000 UNIT/ML IJ SOLN
5000.0000 [IU] | Freq: Once | INTRAMUSCULAR | Status: AC
Start: 2021-10-08 — End: 2021-10-08
  Administered 2021-10-08: 5000 [IU] via SUBCUTANEOUS

## 2021-10-08 MED ORDER — ROCURONIUM BROMIDE 10 MG/ML (PF) SYRINGE
PREFILLED_SYRINGE | INTRAVENOUS | Status: AC
  Filled 2021-10-08: qty 10

## 2021-10-08 MED ORDER — BUPIVACAINE LIPOSOME 1.3 % IJ SUSP
INTRAMUSCULAR | Status: DC | PRN
  Administered 2021-10-08: 20 mL

## 2021-10-08 MED ORDER — LIDOCAINE 2% (20 MG/ML) 5 ML SYRINGE
INTRAMUSCULAR | Status: DC | PRN
  Administered 2021-10-08: 100 mL via INTRAVENOUS

## 2021-10-08 MED ORDER — 0.9 % SODIUM CHLORIDE (POUR BTL) OPTIME
TOPICAL | Status: DC | PRN
  Administered 2021-10-08: 2000 mL

## 2021-10-08 MED ORDER — BUPIVACAINE-EPINEPHRINE (PF) 0.25% -1:200000 IJ SOLN
INTRAMUSCULAR | Status: AC
  Filled 2021-10-08: qty 30

## 2021-10-08 MED ORDER — KETAMINE HCL 10 MG/ML IJ SOLN
INTRAMUSCULAR | Status: DC | PRN
  Administered 2021-10-08: 30 mg via INTRAVENOUS

## 2021-10-08 MED ORDER — DEXAMETHASONE SODIUM PHOSPHATE 10 MG/ML IJ SOLN
INTRAMUSCULAR | Status: DC | PRN
  Administered 2021-10-08: 5 mg via INTRAVENOUS

## 2021-10-08 MED ORDER — ENSURE PRE-SURGERY PO LIQD: 592.0000 mL | Freq: Once | ORAL | Status: DC

## 2021-10-08 MED ORDER — FENTANYL CITRATE (PF) 100 MCG/2ML IJ SOLN
INTRAMUSCULAR | Status: AC
  Filled 2021-10-08: qty 2

## 2021-10-08 MED ORDER — KETAMINE HCL 10 MG/ML IJ SOLN
INTRAMUSCULAR | Status: AC
  Filled 2021-10-08: qty 1

## 2021-10-08 MED ORDER — SIMETHICONE 80 MG PO CHEW
40.0000 mg | CHEWABLE_TABLET | Freq: Four times a day (QID) | ORAL | Status: DC | PRN
  Filled 2021-10-08: qty 1

## 2021-10-08 MED ORDER — OXYCODONE HCL 5 MG PO TABS
5.0000 mg | ORAL_TABLET | ORAL | Status: DC | PRN
  Administered 2021-10-08: 5 mg via ORAL
  Administered 2021-10-09 – 2021-10-10 (×4): 10 mg via ORAL
  Administered 2021-10-11: 5 mg via ORAL
  Filled 2021-10-08 (×5): qty 2
  Filled 2021-10-08 (×2): qty 1

## 2021-10-08 MED ORDER — ONDANSETRON HCL 4 MG PO TABS: 4.0000 mg | ORAL_TABLET | Freq: Four times a day (QID) | ORAL | Status: DC | PRN

## 2021-10-08 MED ORDER — DIPHENHYDRAMINE HCL 25 MG PO CAPS
25.0000 mg | ORAL_CAPSULE | Freq: Four times a day (QID) | ORAL | Status: DC | PRN
  Administered 2021-10-08 – 2021-10-10 (×3): 25 mg via ORAL
  Filled 2021-10-08 (×3): qty 1

## 2021-10-08 MED ORDER — ACETAMINOPHEN 500 MG PO TABS
1000.0000 mg | ORAL_TABLET | Freq: Four times a day (QID) | ORAL | Status: DC
  Administered 2021-10-08 – 2021-10-10 (×7): 1000 mg via ORAL
  Filled 2021-10-08 (×8): qty 2

## 2021-10-08 MED ORDER — ORAL CARE MOUTH RINSE: 15.0000 mL | Freq: Once | OROMUCOSAL | Status: AC

## 2021-10-08 MED ORDER — BUPIVACAINE LIPOSOME 1.3 % IJ SUSP: 20.0000 mL | Freq: Once | INTRAMUSCULAR | Status: DC

## 2021-10-08 MED ORDER — PROPOFOL 10 MG/ML IV BOLUS
INTRAVENOUS | Status: AC
Start: 2021-10-08 — End: ?
  Filled 2021-10-08: qty 20

## 2021-10-08 MED ORDER — DIPHENHYDRAMINE HCL 50 MG/ML IJ SOLN: 25.0000 mg | Freq: Four times a day (QID) | INTRAMUSCULAR | Status: DC | PRN

## 2021-10-08 MED ORDER — ONDANSETRON HCL 4 MG/2ML IJ SOLN
INTRAMUSCULAR | Status: AC
Start: 2021-10-08 — End: 2021-10-08
  Administered 2021-10-08: 4 mg via INTRAVENOUS
  Filled 2021-10-08: qty 2

## 2021-10-08 MED ORDER — KCL IN DEXTROSE-NACL 20-5-0.45 MEQ/L-%-% IV SOLN
INTRAVENOUS | Status: DC
  Filled 2021-10-08: qty 1000

## 2021-10-08 MED ORDER — SCOPOLAMINE 1 MG/3DAYS TD PT72
1.0000 | MEDICATED_PATCH | TRANSDERMAL | Status: DC
  Administered 2021-10-08: 1.5 mg via TRANSDERMAL
  Filled 2021-10-08: qty 1

## 2021-10-08 MED ORDER — ALVIMOPAN 12 MG PO CAPS
12.0000 mg | ORAL_CAPSULE | Freq: Two times a day (BID) | ORAL | Status: DC
  Administered 2021-10-09: 12 mg via ORAL
  Filled 2021-10-08: qty 1

## 2021-10-08 MED ORDER — HEPARIN SODIUM (PORCINE) 5000 UNIT/ML IJ SOLN
INTRAMUSCULAR | Status: AC
  Filled 2021-10-08: qty 1

## 2021-10-08 MED ORDER — GABAPENTIN 300 MG PO CAPS
300.0000 mg | ORAL_CAPSULE | ORAL | Status: AC
  Administered 2021-10-08: 300 mg via ORAL
  Filled 2021-10-08: qty 1

## 2021-10-08 MED ORDER — PHENYLEPHRINE HCL-NACL 20-0.9 MG/250ML-% IV SOLN
INTRAVENOUS | Status: DC | PRN
  Administered 2021-10-08: 30 ug/min via INTRAVENOUS

## 2021-10-08 MED ORDER — ENOXAPARIN SODIUM 40 MG/0.4ML IJ SOSY
40.0000 mg | PREFILLED_SYRINGE | INTRAMUSCULAR | Status: DC
  Administered 2021-10-09 – 2021-10-10 (×2): 40 mg via SUBCUTANEOUS
  Filled 2021-10-08 (×2): qty 0.4

## 2021-10-08 MED ORDER — PROPOFOL 10 MG/ML IV BOLUS
INTRAVENOUS | Status: DC | PRN
  Administered 2021-10-08: 180 mg via INTRAVENOUS

## 2021-10-08 MED ORDER — ONDANSETRON HCL 4 MG/2ML IJ SOLN
INTRAMUSCULAR | Status: AC
  Filled 2021-10-08: qty 2

## 2021-10-08 MED ORDER — ENSURE SURGERY PO LIQD
237.0000 mL | Freq: Two times a day (BID) | ORAL | Status: DC
  Administered 2021-10-09 – 2021-10-10 (×2): 237 mL via ORAL

## 2021-10-08 MED ORDER — ROPIVACAINE HCL 5 MG/ML IJ SOLN
INTRAMUSCULAR | Status: AC
  Filled 2021-10-08: qty 30

## 2021-10-08 MED ORDER — BUPIVACAINE-EPINEPHRINE 0.25% -1:200000 IJ SOLN
INTRAMUSCULAR | Status: DC | PRN
  Administered 2021-10-08: 30 mL

## 2021-10-08 MED ORDER — PHENYLEPHRINE 40 MCG/ML (10ML) SYRINGE FOR IV PUSH (FOR BLOOD PRESSURE SUPPORT)
PREFILLED_SYRINGE | INTRAVENOUS | Status: DC | PRN
  Administered 2021-10-08 (×2): 120 ug via INTRAVENOUS
  Administered 2021-10-08: 80 ug via INTRAVENOUS

## 2021-10-08 MED ORDER — HYDROMORPHONE HCL 1 MG/ML IJ SOLN
0.5000 mg | INTRAMUSCULAR | Status: DC | PRN
  Administered 2021-10-08 – 2021-10-09 (×2): 0.5 mg via INTRAVENOUS
  Filled 2021-10-08 (×2): qty 0.5

## 2021-10-08 MED ORDER — LIDOCAINE HCL (PF) 2 % IJ SOLN
INTRAMUSCULAR | Status: DC | PRN
  Administered 2021-10-08: 1.5 mg/kg/h via INTRADERMAL

## 2021-10-08 MED ORDER — AMISULPRIDE (ANTIEMETIC) 5 MG/2ML IV SOLN: 10.0000 mg | Freq: Once | INTRAVENOUS | Status: DC | PRN

## 2021-10-08 MED ORDER — BUPIVACAINE LIPOSOME 1.3 % IJ SUSP
INTRAMUSCULAR | Status: AC
  Filled 2021-10-08: qty 20

## 2021-10-08 MED ORDER — PROPOFOL 500 MG/50ML IV EMUL
INTRAVENOUS | Status: DC | PRN
  Administered 2021-10-08: 25 ug/kg/min via INTRAVENOUS

## 2021-10-08 MED ORDER — CELECOXIB 200 MG PO CAPS
200.0000 mg | ORAL_CAPSULE | Freq: Once | ORAL | Status: AC
  Administered 2021-10-08: 200 mg via ORAL
  Filled 2021-10-08: qty 1

## 2021-10-08 MED ORDER — SERTRALINE HCL 100 MG PO TABS
100.0000 mg | ORAL_TABLET | Freq: Every day | ORAL | Status: DC
  Administered 2021-10-09 – 2021-10-10 (×2): 100 mg via ORAL
  Filled 2021-10-08 (×2): qty 1

## 2021-10-08 MED ORDER — FENTANYL CITRATE (PF) 100 MCG/2ML IJ SOLN
INTRAMUSCULAR | Status: DC | PRN
  Administered 2021-10-08 (×3): 50 ug via INTRAVENOUS
  Administered 2021-10-08: 100 ug via INTRAVENOUS

## 2021-10-08 MED ORDER — EPHEDRINE SULFATE-NACL 50-0.9 MG/10ML-% IV SOSY
PREFILLED_SYRINGE | INTRAVENOUS | Status: DC | PRN
Start: 2021-10-08 — End: 2021-10-08
  Administered 2021-10-08: 10 mg via INTRAVENOUS
  Administered 2021-10-08: 5 mg via INTRAVENOUS

## 2021-10-08 MED ORDER — LIDOCAINE HCL (PF) 2 % IJ SOLN
INTRAMUSCULAR | Status: AC
  Filled 2021-10-08: qty 5

## 2021-10-08 MED ORDER — FENTANYL CITRATE PF 50 MCG/ML IJ SOSY: 25.0000 ug | PREFILLED_SYRINGE | INTRAMUSCULAR | Status: DC | PRN

## 2021-10-08 MED ORDER — ROCURONIUM BROMIDE 10 MG/ML (PF) SYRINGE
PREFILLED_SYRINGE | INTRAVENOUS | Status: DC | PRN
  Administered 2021-10-08: 30 mg via INTRAVENOUS
  Administered 2021-10-08 (×2): 20 mg via INTRAVENOUS
  Administered 2021-10-08: 100 mg via INTRAVENOUS
  Administered 2021-10-08 (×2): 30 mg via INTRAVENOUS

## 2021-10-08 MED ORDER — POVIDONE-IODINE 10 % EX SWAB: 2.0000 "application " | Freq: Once | CUTANEOUS | Status: DC

## 2021-10-08 MED ORDER — ONDANSETRON HCL 4 MG/2ML IJ SOLN
INTRAMUSCULAR | Status: DC | PRN
  Administered 2021-10-08: 4 mg via INTRAVENOUS

## 2021-10-08 MED ORDER — ONDANSETRON HCL 4 MG/2ML IJ SOLN: 4.0000 mg | Freq: Once | INTRAMUSCULAR | Status: AC

## 2021-10-08 MED ORDER — SUGAMMADEX SODIUM 500 MG/5ML IV SOLN
INTRAVENOUS | Status: AC
  Filled 2021-10-08: qty 5

## 2021-10-08 MED ORDER — LACTATED RINGERS IV SOLN: INTRAVENOUS | Status: DC | PRN

## 2021-10-08 MED ORDER — SODIUM CHLORIDE 0.9 % IV SOLN
2.0000 g | INTRAVENOUS | Status: AC
  Administered 2021-10-08 (×2): 2 g via INTRAVENOUS
  Filled 2021-10-08: qty 2

## 2021-10-08 MED ORDER — ONDANSETRON HCL 4 MG/2ML IJ SOLN: 4.0000 mg | Freq: Four times a day (QID) | INTRAMUSCULAR | Status: DC | PRN

## 2021-10-08 MED ORDER — LACTATED RINGERS IV SOLN
INTRAVENOUS | Status: DC
Start: 2021-10-08 — End: 2021-10-08

## 2021-10-08 MED ORDER — SACCHAROMYCES BOULARDII 250 MG PO CAPS
250.0000 mg | ORAL_CAPSULE | Freq: Two times a day (BID) | ORAL | Status: DC
  Administered 2021-10-08 – 2021-10-10 (×5): 250 mg via ORAL
  Filled 2021-10-08 (×5): qty 1

## 2021-10-08 MED ORDER — CHLORHEXIDINE GLUCONATE 0.12 % MT SOLN
15.0000 mL | Freq: Once | OROMUCOSAL | Status: AC
  Administered 2021-10-08: 15 mL via OROMUCOSAL

## 2021-10-08 MED ORDER — ALUM & MAG HYDROXIDE-SIMETH 200-200-20 MG/5ML PO SUSP
30.0000 mL | Freq: Four times a day (QID) | ORAL | Status: DC | PRN
  Administered 2021-10-08 – 2021-10-10 (×3): 30 mL via ORAL
  Filled 2021-10-08 (×4): qty 30

## 2021-10-08 MED ORDER — LIDOCAINE HCL 2 % IJ SOLN
INTRAMUSCULAR | Status: AC
  Filled 2021-10-08: qty 20

## 2021-10-08 MED ORDER — LACTATED RINGERS IR SOLN
Status: DC | PRN
  Administered 2021-10-08: 1000 mL

## 2021-10-08 MED ORDER — PHENYLEPHRINE HCL-NACL 20-0.9 MG/250ML-% IV SOLN
INTRAVENOUS | Status: AC
  Filled 2021-10-08: qty 250

## 2021-10-08 MED ORDER — ACETAMINOPHEN 500 MG PO TABS: 1000.0000 mg | ORAL_TABLET | Freq: Once | ORAL | Status: DC

## 2021-10-08 MED ORDER — SODIUM CHLORIDE 0.9 % IV SOLN
2.0000 g | Freq: Two times a day (BID) | INTRAVENOUS | Status: AC
  Administered 2021-10-08: 2 g via INTRAVENOUS
  Filled 2021-10-08: qty 2

## 2021-10-08 MED ORDER — MIDAZOLAM HCL 2 MG/2ML IJ SOLN
INTRAMUSCULAR | Status: AC
  Filled 2021-10-08: qty 2

## 2021-10-08 MED ORDER — SUGAMMADEX SODIUM 500 MG/5ML IV SOLN
INTRAVENOUS | Status: DC | PRN
  Administered 2021-10-08: 300 mg via INTRAVENOUS

## 2021-10-08 MED ORDER — FENTANYL CITRATE (PF) 100 MCG/2ML IJ SOLN
INTRAMUSCULAR | Status: AC
Start: 2021-10-08 — End: ?
  Filled 2021-10-08: qty 2

## 2021-10-08 MED ORDER — SODIUM CHLORIDE 0.9 % IV SOLN
INTRAVENOUS | Status: AC
  Filled 2021-10-08: qty 2

## 2021-10-08 MED ORDER — GABAPENTIN 300 MG PO CAPS
300.0000 mg | ORAL_CAPSULE | Freq: Two times a day (BID) | ORAL | Status: DC
  Administered 2021-10-08 – 2021-10-10 (×5): 300 mg via ORAL
  Filled 2021-10-08 (×5): qty 1

## 2021-10-08 MED ORDER — ACETAMINOPHEN 500 MG PO TABS
1000.0000 mg | ORAL_TABLET | ORAL | Status: AC
  Administered 2021-10-08: 1000 mg via ORAL
  Filled 2021-10-08: qty 2

## 2021-10-08 MED ORDER — DEXAMETHASONE SODIUM PHOSPHATE 10 MG/ML IJ SOLN
INTRAMUSCULAR | Status: AC
  Filled 2021-10-08: qty 1

## 2021-10-08 MED ORDER — MIDAZOLAM HCL 5 MG/5ML IJ SOLN
INTRAMUSCULAR | Status: DC | PRN
  Administered 2021-10-08: 2 mg via INTRAVENOUS

## 2021-10-08 SURGICAL SUPPLY — 137 items
BAG COUNTER SPONGE SURGICOUNT (BAG) ×3 IMPLANT
BAG LAPAROSCOPIC 12 15 PORT 16 (BASKET) IMPLANT
BAG RETRIEVAL 12/15 (BASKET) ×3
BARRIER ADHS 3X4 INTERCEED (GAUZE/BANDAGES/DRESSINGS) IMPLANT
BLADE EXTENDED COATED 6.5IN (ELECTRODE) IMPLANT
CANISTER SUCT 3000ML PPV (MISCELLANEOUS) ×3 IMPLANT
CANNULA REDUC XI 12-8 STAPL (CANNULA)
CANNULA REDUCER 12-8 DVNC XI (CANNULA) IMPLANT
CATH FOLEY 3WAY  5CC 16FR (CATHETERS)
CATH FOLEY 3WAY 5CC 16FR (CATHETERS) ×2 IMPLANT
CELLS DAT CNTRL 66122 CELL SVR (MISCELLANEOUS) IMPLANT
CHLORAPREP W/TINT 26 (MISCELLANEOUS) ×3 IMPLANT
COVER BACK TABLE 60X90IN (DRAPES) ×3 IMPLANT
COVER SURGICAL LIGHT HANDLE (MISCELLANEOUS) ×6 IMPLANT
COVER TIP SHEARS 8 DVNC (MISCELLANEOUS) ×4 IMPLANT
COVER TIP SHEARS 8MM DA VINCI (MISCELLANEOUS) ×6
DEFOGGER SCOPE WARMER CLEARIFY (MISCELLANEOUS) ×3 IMPLANT
DERMABOND ADVANCED (GAUZE/BANDAGES/DRESSINGS) ×1
DERMABOND ADVANCED .7 DNX12 (GAUZE/BANDAGES/DRESSINGS) ×2 IMPLANT
DRAIN CHANNEL 19F RND (DRAIN) ×1 IMPLANT
DRAPE ARM DVNC X/XI (DISPOSABLE) ×16 IMPLANT
DRAPE COLUMN DVNC XI (DISPOSABLE) ×4 IMPLANT
DRAPE DA VINCI XI ARM (DISPOSABLE) ×12
DRAPE DA VINCI XI COLUMN (DISPOSABLE) ×3
DRAPE SURG IRRIG POUCH 19X23 (DRAPES) ×3 IMPLANT
DRSG OPSITE POSTOP 4X10 (GAUZE/BANDAGES/DRESSINGS) ×1 IMPLANT
DRSG OPSITE POSTOP 4X6 (GAUZE/BANDAGES/DRESSINGS) IMPLANT
DRSG OPSITE POSTOP 4X8 (GAUZE/BANDAGES/DRESSINGS) IMPLANT
DRSG TEGADERM 4X4.75 (GAUZE/BANDAGES/DRESSINGS) ×1 IMPLANT
ELECT PENCIL ROCKER SW 15FT (MISCELLANEOUS) ×3 IMPLANT
ELECT REM PT RETURN 15FT ADLT (MISCELLANEOUS) ×6 IMPLANT
ENDOLOOP SUT PDS II  0 18 (SUTURE)
ENDOLOOP SUT PDS II 0 18 (SUTURE) IMPLANT
EVACUATOR DRAINAGE 10X20 100CC (DRAIN) IMPLANT
EVACUATOR SILICONE 100CC (DRAIN) ×3 IMPLANT
GAUZE SPONGE 2X2 8PLY STRL LF (GAUZE/BANDAGES/DRESSINGS) IMPLANT
GLOVE BIO SURGEON STRL SZ 6.5 (GLOVE) ×9 IMPLANT
GLOVE BIO SURGEON STRL SZ7.5 (GLOVE) ×9 IMPLANT
GLOVE BIOGEL PI IND STRL 7.0 (GLOVE) ×8 IMPLANT
GLOVE BIOGEL PI INDICATOR 7.0 (GLOVE) ×4
GLOVE INDICATOR 6.5 STRL GRN (GLOVE) ×3 IMPLANT
GOWN STRL REUS W/ TWL XL LVL3 (GOWN DISPOSABLE) ×6 IMPLANT
GOWN STRL REUS W/TWL XL LVL3 (GOWN DISPOSABLE) ×9
GRASPER SUT TROCAR 14GX15 (MISCELLANEOUS) ×3 IMPLANT
HOLDER FOLEY CATH W/STRAP (MISCELLANEOUS) ×3 IMPLANT
IRRIG SUCT STRYKERFLOW 2 WTIP (MISCELLANEOUS) ×3
IRRIGATION SUCT STRKRFLW 2 WTP (MISCELLANEOUS) ×4 IMPLANT
KIT PROCEDURE DA VINCI SI (MISCELLANEOUS)
KIT PROCEDURE DVNC SI (MISCELLANEOUS) IMPLANT
KIT SIGMOIDOSCOPE (SET/KITS/TRAYS/PACK) ×1 IMPLANT
KIT TURNOVER KIT A (KITS) IMPLANT
NDL INSUFFLATION 14GA 120MM (NEEDLE) ×2 IMPLANT
NEEDLE INSUFFLATION 14GA 120MM (NEEDLE) ×3 IMPLANT
OBTURATOR OPTICAL STANDARD 8MM (TROCAR)
OBTURATOR OPTICAL STND 8 DVNC (TROCAR)
OBTURATOR OPTICALSTD 8 DVNC (TROCAR) IMPLANT
OCCLUDER COLPOPNEUMO (BALLOONS) ×3 IMPLANT
PACK CARDIOVASCULAR III (CUSTOM PROCEDURE TRAY) ×3 IMPLANT
PACK COLON (CUSTOM PROCEDURE TRAY) ×3 IMPLANT
PACK ROBOT WH (CUSTOM PROCEDURE TRAY) ×3 IMPLANT
PACK ROBOTIC GOWN (GOWN DISPOSABLE) ×3 IMPLANT
PACK TRENDGUARD 450 HYBRID PRO (MISCELLANEOUS) IMPLANT
PAD POSITIONING PINK XL (MISCELLANEOUS) ×3 IMPLANT
PAD PREP 24X48 CUFFED NSTRL (MISCELLANEOUS) ×3 IMPLANT
PENCIL SMOKE EVACUATOR (MISCELLANEOUS) IMPLANT
PROTECTOR NERVE ULNAR (MISCELLANEOUS) ×6 IMPLANT
RELOAD STAPLE 60 3.5 BLU DVNC (STAPLE) IMPLANT
RELOAD STAPLE 60 4.3 GRN DVNC (STAPLE) IMPLANT
RELOAD STAPLER 3.5X60 BLU DVNC (STAPLE) ×2 IMPLANT
RELOAD STAPLER 4.3X60 GRN DVNC (STAPLE) IMPLANT
RETRACTOR WND ALEXIS 18 MED (MISCELLANEOUS) IMPLANT
RTRCTR WOUND ALEXIS 18CM MED (MISCELLANEOUS)
SCISSORS LAP 5X35 DISP (ENDOMECHANICALS) IMPLANT
SEAL CANN UNIV 5-8 DVNC XI (MISCELLANEOUS) ×12 IMPLANT
SEAL XI 5MM-8MM UNIVERSAL (MISCELLANEOUS) ×18
SEALER VESSEL DA VINCI XI (MISCELLANEOUS) ×3
SEALER VESSEL EXT DVNC XI (MISCELLANEOUS) ×2 IMPLANT
SET IRRIG Y TYPE TUR BLADDER L (SET/KITS/TRAYS/PACK) IMPLANT
SET TRI-LUMEN FLTR TB AIRSEAL (TUBING) ×3 IMPLANT
SOLUTION ELECTROLUBE (MISCELLANEOUS) ×3 IMPLANT
SPIKE FLUID TRANSFER (MISCELLANEOUS) ×6 IMPLANT
SPONGE GAUZE 2X2 STER 10/PKG (GAUZE/BANDAGES/DRESSINGS) ×1
STAPLER 60 DA VINCI SURE FORM (STAPLE) ×3
STAPLER 60 SUREFORM DVNC (STAPLE) IMPLANT
STAPLER CANNULA SEAL DVNC XI (STAPLE) IMPLANT
STAPLER CANNULA SEAL XI (STAPLE)
STAPLER ECHELON POWER CIR 29 (STAPLE) ×1 IMPLANT
STAPLER ECHELON POWER CIR 31 (STAPLE) IMPLANT
STAPLER RELOAD 3.5X60 BLU DVNC (STAPLE) ×2
STAPLER RELOAD 3.5X60 BLUE (STAPLE) ×3
STAPLER RELOAD 4.3X60 GREEN (STAPLE)
STAPLER RELOAD 4.3X60 GRN DVNC (STAPLE)
STOPCOCK 4 WAY LG BORE MALE ST (IV SETS) ×6 IMPLANT
SUT ETHIBOND 0 36 GRN (SUTURE) ×2 IMPLANT
SUT ETHILON 2 0 PS N (SUTURE) ×1 IMPLANT
SUT NOVA NAB DX-16 0-1 5-0 T12 (SUTURE) ×4 IMPLANT
SUT NOVA NAB GS-21 0 18 T12 DT (SUTURE) ×1 IMPLANT
SUT PROLENE 2 0 KS (SUTURE) ×1 IMPLANT
SUT SILK 2 0 (SUTURE) ×3
SUT SILK 2 0 SH CR/8 (SUTURE) IMPLANT
SUT SILK 2-0 18XBRD TIE 12 (SUTURE) ×2 IMPLANT
SUT SILK 3 0 (SUTURE) ×3
SUT SILK 3 0 SH CR/8 (SUTURE) ×3 IMPLANT
SUT SILK 3-0 18XBRD TIE 12 (SUTURE) IMPLANT
SUT V-LOC BARB 180 2/0GR6 GS22 (SUTURE) ×3
SUT VIC AB 0 CT1 27 (SUTURE) ×3
SUT VIC AB 0 CT1 27XBRD ANBCTR (SUTURE) ×4 IMPLANT
SUT VIC AB 2-0 SH 18 (SUTURE) IMPLANT
SUT VIC AB 2-0 SH 27 (SUTURE) ×6
SUT VIC AB 2-0 SH 27X BRD (SUTURE) IMPLANT
SUT VIC AB 3-0 SH 18 (SUTURE) IMPLANT
SUT VIC AB 4-0 PS2 27 (SUTURE) ×6 IMPLANT
SUT VICRYL 0 TIES 12 18 (SUTURE) ×1 IMPLANT
SUT VICRYL 0 UR6 27IN ABS (SUTURE) ×6 IMPLANT
SUT VICRYL RAPIDE 4/0 PS 2 (SUTURE) ×4 IMPLANT
SUT VLOC 180 0 6IN GS21 (SUTURE) ×1 IMPLANT
SUT VLOC 180 0 9IN  GS21 (SUTURE) ×3
SUT VLOC 180 0 9IN GS21 (SUTURE) IMPLANT
SUTURE V-LC BRB 180 2/0GR6GS22 (SUTURE) IMPLANT
SYR 10ML ECCENTRIC (SYRINGE) ×3 IMPLANT
SYS LAPSCP GELPORT 120MM (MISCELLANEOUS)
SYS WOUND ALEXIS 18CM MED (MISCELLANEOUS) ×3
SYSTEM LAPSCP GELPORT 120MM (MISCELLANEOUS) IMPLANT
SYSTEM WOUND ALEXIS 18CM MED (MISCELLANEOUS) IMPLANT
TIP RUMI ORANGE 6.7MMX12CM (TIP) IMPLANT
TIP UTERINE 5.1X6CM LAV DISP (MISCELLANEOUS) IMPLANT
TIP UTERINE 6.7X10CM GRN DISP (MISCELLANEOUS) ×1 IMPLANT
TIP UTERINE 6.7X6CM WHT DISP (MISCELLANEOUS) IMPLANT
TIP UTERINE 6.7X8CM BLUE DISP (MISCELLANEOUS) IMPLANT
TOWEL OR 17X26 10 PK STRL BLUE (TOWEL DISPOSABLE) ×3 IMPLANT
TOWEL OR NON WOVEN STRL DISP B (DISPOSABLE) ×3 IMPLANT
TRAY FOLEY MTR SLVR 16FR STAT (SET/KITS/TRAYS/PACK) ×3 IMPLANT
TRENDGUARD 450 HYBRID PRO PACK (MISCELLANEOUS) ×3
TROCAR ADV FIXATION 5X100MM (TROCAR) ×3 IMPLANT
TUBING CONNECTING 10 (TUBING) ×6 IMPLANT
TUBING INSUFFLATION 10FT LAP (TUBING) ×3 IMPLANT
WATER STERILE IRR 1000ML POUR (IV SOLUTION) ×3 IMPLANT

## 2021-10-08 NOTE — Anesthesia Postprocedure Evaluation (Signed)
Anesthesia Post Note ? ?Patient: Connie Cole ? ?Procedure(s) Performed: XI ROBOT ASSISTED LAPAROSCOPIC SIGMOIDECTOMY ?RECTOPEXY ?HERNIA REPAIR UMBILICAL ADULT ?XI ROBOTIC ASSISTED LAPAROSCOPIC HYSTERECTOMY AND SALPINGECTOMY (Bilateral) ? ?  ? ?Patient location during evaluation: PACU ?Anesthesia Type: General ?Level of consciousness: sedated ?Pain management: pain level controlled ?Vital Signs Assessment: post-procedure vital signs reviewed and stable ?Respiratory status: spontaneous breathing and respiratory function stable ?Cardiovascular status: stable ?Postop Assessment: no apparent nausea or vomiting ?Anesthetic complications: no ? ? ?No notable events documented. ? ?Last Vitals:  ?Vitals:  ? 10/08/21 1500 10/08/21 1547  ?BP: 118/79 102/65  ?Pulse: 78 79  ?Resp: 12 14  ?Temp:  (!) 36.4 ?C  ?SpO2: 99% 100%  ?  ?Last Pain:  ?Vitals:  ? 10/08/21 1547  ?TempSrc: Oral  ?PainSc: 7   ? ? ?  ?  ?  ?  ?  ?  ? ?Ramiz Turpin DANIEL ? ? ? ? ?

## 2021-10-08 NOTE — Anesthesia Procedure Notes (Signed)
Procedure Name: Intubation ?Date/Time: 10/08/2021 7:54 AM ?Performed by: West Pugh, CRNA ?Pre-anesthesia Checklist: Patient identified, Emergency Drugs available, Suction available, Patient being monitored and Timeout performed ?Patient Re-evaluated:Patient Re-evaluated prior to induction ?Oxygen Delivery Method: Circle system utilized ?Preoxygenation: Pre-oxygenation with 100% oxygen ?Induction Type: IV induction ?Ventilation: Mask ventilation without difficulty ?Laryngoscope Size: Mac and 3 ?Grade View: Grade II ?Tube type: Oral ?Tube size: 7.0 mm ?Number of attempts: 1 ?Airway Equipment and Method: Stylet ?Placement Confirmation: ETT inserted through vocal cords under direct vision, positive ETCO2, CO2 detector and breath sounds checked- equal and bilateral ?Secured at: 21 cm ?Tube secured with: Tape ?Dental Injury: Teeth and Oropharynx as per pre-operative assessment  ? ? ? ? ?

## 2021-10-08 NOTE — Transfer of Care (Signed)
Immediate Anesthesia Transfer of Care Note ? ?Patient: Connie Cole ? ?Procedure(s) Performed: XI ROBOT ASSISTED LAPAROSCOPIC SIGMOIDECTOMY ?RECTOPEXY ?HERNIA REPAIR UMBILICAL ADULT ?XI ROBOTIC ASSISTED LAPAROSCOPIC HYSTERECTOMY AND SALPINGECTOMY (Bilateral) ? ?Patient Location: PACU ? ?Anesthesia Type:General ? ?Level of Consciousness: drowsy and patient cooperative ? ?Airway & Oxygen Therapy: Patient Spontanous Breathing and Patient connected to face mask oxygen ? ?Post-op Assessment: Report given to RN and Post -op Vital signs reviewed and stable ? ?Post vital signs: Reviewed and stable ? ?Last Vitals:  ?Vitals Value Taken Time  ?BP 111/79 10/08/21 1406  ?Temp 36.9 ?C 10/08/21 1406  ?Pulse 85 10/08/21 1407  ?Resp 12 10/08/21 1407  ?SpO2 100 % 10/08/21 1407  ?Vitals shown include unvalidated device data. ? ?Last Pain:  ?Vitals:  ? 10/08/21 0620  ?TempSrc:   ?PainSc: 0-No pain  ?   ? ?  ? ?Complications: No notable events documented. ?

## 2021-10-08 NOTE — Op Note (Signed)
10/08/2021 ? ?1:44 PM ? ?PATIENT:  Connie Cole  51 y.o. female ? ?Patient Care Team: ?Ronnell Freshwater, NP as PCP - General (Family Medicine) ?Brien Few, MD as Consulting Physician (Obstetrics and Gynecology) ?Merrilee Seashore, MD as Consulting Physician (Internal Medicine) ?Ob[facility], [facility] ?Juanita Craver, MD as Consulting Physician (Gastroenterology) ? ?PRE-OPERATIVE DIAGNOSIS:  rectal prolapse umbilical hernia;MENORAGIA ? ?POST-OPERATIVE DIAGNOSIS:  rectal prolapse umbilical hernia;MENORAGIA ? ?PROCEDURE:   ?XI ROBOT ASSISTED LAPAROSCOPIC SIGMOIDECTOMY ?RECTOPEXY ?HERNIA REPAIR UMBILICAL ADULT ?XI ROBOTIC ASSISTED LAPAROSCOPIC HYSTERECTOMY AND SALPINGECTOMY ? ?  Surgeon(s): ?Brien Few, MD ?Ileana Roup, MD ?Leighton Ruff, MD ? ?ASSISTANT: Dr Dema Severin  ? ?ANESTHESIA:   local and general ? ?EBL: 173m  Total I/O ?In: 1800 [I.V.:1600; IV Piggyback:200] ?Out: 375 [Urine:225; Blood:150] ? ?Delay start of Pharmacological VTE agent (>24hrs) due to surgical blood loss or risk of bleeding:  no ? ?DRAINS: (27F) Jackson-Pratt drain(s) with closed bulb suction in the pelvis   ? ?SPECIMEN:  Source of Specimen:  Sigmoid colon ? ?DISPOSITION OF SPECIMEN:  PATHOLOGY ? ?COUNTS:  YES ? ?PLAN OF CARE: Admit to inpatient  ? ?PATIENT DISPOSITION:  PACU - hemodynamically stable. ? ?INDICATION:   ? ?51y.o. female with rectal prolapse, full-thickness.  I recommended sigmoidectomy and rectopexy to correct the problem and prevent recurrence.  Patient also having menorrhagia and undergoing con commitment hysterectomy. ? ?The anatomy & physiology of the digestive tract was discussed.  The pathophysiology was discussed.  Natural history risks without surgery was discussed.   I worked to give an overview of the disease and the frequent need to have multispecialty involvement.  I feel the risks of no intervention will lead to serious problems that outweigh the operative risks; therefore, I recommended a partial colectomy  to remove the pathology.  Laparoscopic & open techniques were discussed.   ?Risks such as bleeding, infection, abscess, leak, reoperation, possible ostomy, hernia, heart attack, death, and other risks were discussed.  I noted a good likelihood this will help address the problem.   Goals of post-operative recovery were discussed as well.   ? ?The patient expressed understanding & wished to proceed with surgery. ? ?OR FINDINGS:  ? ?Patient had moderately redundant pelvic floor with sigmoid colon adherent to the right side of the abdomen at the base of the cecum. ? ? ?The anastomosis rests ~10 cm from the anal verge by rigid proctoscopy. ? ?DESCRIPTION:  ? ?Informed consent was confirmed.  The patient underwent general anaesthesia without difficulty.  The patient was positioned appropriately.  VTE prevention in place.  The patient's abdomen was clipped, prepped, & draped in a sterile fashion.  Surgical timeout confirmed our plan. ? ?The procedure was began by Dr. TRonita Hipps  Hysterectomy and extraction were completed.  When I entered the room the patient was asleep and in lithotomy position with the robot docked on the right side.  We placed appropriate ports and continue the procedure. ? ? I mobilized the sigmoid colon off of the right pelvis.  I identified the right ureter and preserve this.  I scored the base of peritoneum of the right side of the mesentery of the left colon from the ligament of Treitz to the peritoneal reflection of the mid rectum.  The patient had minimal redundant sigmoid colon due to the right-sided adhesions.  I elevated the sigmoid mesentery and enetered into the retro-mesenteric plane. We were able to identify the left ureter and gonadal vessels. We kept those posterior within the retroperitoneum and elevated the  left colon mesentery off that. I did isolated IMA pedicle but did not ligate it yet.  I continued distally and got into the avascular plane posterior to the mesorectum. This allowed me  to help mobilize the rectum as well by freeing the mesorectum off the sacrum.  I continued this dissection down to the pelvic floor.  I mobilized the peritoneal coverings towards the peritoneal reflection on left side of the rectum.  I could see the right and left ureters and stayed away from them.  I circumferentially freed the mesorectum at the pelvic floor. ? ? I skeletonized the inferior mesenteric artery pedicle.    After confirming the left ureter was out of the way, I went ahead and ligated the inferior mesenteric artery pedicle with bipolar robotic vessel sealer well above its takeoff from the aorta.  We ensured hemostasis. I skeletonized the mesorectum at the junction at the proximal rectum using blunt dissection & bipolar robotic vessel sealer.  We then placed a 12 mm port into the suprapubic area.  A blue load 60 mm robotic stapler was used to transect the rectosigmoid junction.  I divided the mesentery of the proximal sigmoid colon to allow for an anastomosis in intermittent fashion.  Once this was complete a 2-0 Vicryl suture was placed into the periosteum of the sacral promontory and this was used to secure the rectal stump.  I then placed 2, 2-0 Ethibond sutures into the periosteum of the sacral promontory and pulled the rectum peritoneal coverings up and secured this to the sacral promontory.  Once this was completed the robot was undocked.  A small Pfannenstiel incision was made through the previous suprapubic port.  An Alexis wound protector was placed the colon was brought out and divided proximally over a pursestring device.  A 2-0 Prolene pursestring suture was placed and a 29 mm EEA anvil was placed into the open colon.  The pursestring was tied tightly around this.  This was then placed back into the abdomen.  The sigmoid colon was removed and sent to pathology.  Under laparoscopic visualization a tap block was placed bilaterally.  Once this was complete, an anastomosis was created using the  EEA stapler under laparoscopic visualization.  This was tested under irrigation and the insufflation.  There was no leak noted. ? ?The robot was then redocked.  An additional Ethibond suture was placed robotically for added rectopexy due to concern of the robotic stapler stretching the previous sutures.  Once this was complete, I confirmed good talked tension on the rectum.  There was no tension noted on the anastomosis.  A 19 [place] Blake drain was placed in the pelvis.  2, 2 oh V-Loc sutures were used to close the peritoneal coverings.  All sutures and needles were removed.  The robot was undocked.  The 52 [place] Blake drain was brought out through the right lower quadrant port site and secured into place with a 2-0 nylon suture.  The umbilical hernia was then closed under laparoscopic visualization using a 0 Vicryl suture and a laparoscopic suture passer.  Once this was complete the ports were removed as well as the [place] wound protector.  We switched a clean gowns, gloves, instruments and drapes. ? ?The Pfannenstiel peritoneum was closed using a running 0 Vicryl suture.  The fascia was closed using running 0 Novafil sutures.  The subcutaneous tissue was reapproximated using a running 2-0 Vicryl suture and the skin was closed using a running 4-0 Vicryl suture.  A sterile  dressing was applied.  The remaining port sites were closed using interrupted 4-0 Vicryl sutures and Dermabond.  The patient was then awakened from anesthesia and sent to the postanesthesia care unit in stable condition.  All counts were correct per operating room staff. ? ?An MD assistant was necessary for tissue manipulation, retraction and positioning due to the complexity of the case and hospital policies  ? ?Rosario Adie, MD ? ?Colorectal and General Surgery ?[facility]  ?

## 2021-10-08 NOTE — Progress Notes (Signed)
Patient seen and examined. ?Consent witnessed and signed. ?No changes noted. ?Update completed.  ?BP (!) 132/95   Pulse 97   Temp 98.5 ?F (36.9 ?C) (Oral)   Resp 20   Wt 72.1 kg   LMP 09/22/2021   SpO2 99%   BMI 28.17 kg/m?  ?CBC ?   ?Component Value Date/Time  ? WBC 6.2 10/08/2021 0526  ? RBC 3.57 (L) 10/08/2021 0526  ? HGB 9.4 (L) 10/08/2021 0526  ? HGB 10.9 (L) 02/11/2021 1124  ? HCT 30.0 (L) 10/08/2021 0526  ? HCT 32.7 (L) 02/11/2021 1124  ? PLT 386 10/08/2021 0526  ? PLT 366 02/11/2021 1124  ? MCV 84.0 10/08/2021 0526  ? MCV 94 02/11/2021 1124  ? MCH 26.3 10/08/2021 0526  ? MCHC 31.3 10/08/2021 0526  ? RDW 17.1 (H) 10/08/2021 0526  ? RDW 12.1 02/11/2021 1124  ? LYMPHSABS 1.3 02/11/2021 1124  ? MONOABS 0.7 11/07/2017 1627  ? EOSABS 0.2 02/11/2021 1124  ? BASOSABS 0.1 02/11/2021 1124  ? ? ?

## 2021-10-08 NOTE — Brief Op Note (Signed)
10/08/2021 ? ?11:15 AM ? ?PATIENT:  Connie Cole  51 y.o. female ? ?PRE-OPERATIVE DIAGNOSIS:  AUB, failed EAB, Secondary anemia ? ?POST-OPERATIVE DIAGNOSIS:  Same and likely adenomyosis ? ?PROCEDURE:  Procedure(s): ?LYSIS OF RIGHT ADNEXAL ADHESIONS ?XI ROBOTIC ASSISTED LAPAROSCOPIC HYSTERECTOMY AND SALPINGECTOMY ?REMOVAL OF RIGHT PARATUBAL CYST ? ?SURGEON: Panhia Karl  ? ?ASSISTANTS: PAUL  ? ?ANESTHESIA:   general ? ?ESTIMATED BLOOD LOSS: 100 mL  ? ?DRAINS: Urinary Catheter (Foley)  ? ?LOCAL MEDICATIONS USED:  OTHER PER DR Marcello Moores ? ?SPECIMEN:  Source of Specimen:  UTERUS, CERVIX AND BILATERAL TUBES AND RIGHT PARATUBAL CYST ? ?DISPOSITION OF SPECIMEN:  PATHOLOGY ? ?COUNTS:  YES ? ?DICTATION #: [id number] ? ?PLAN OF CARE: DC HOME PER [facility] ? ?PATIENT DISPOSITION:  PACU - hemodynamically stable.  ? ? ? ? ? ? ? ? ? ? ? ? ?  ?

## 2021-10-09 ENCOUNTER — Encounter (HOSPITAL_COMMUNITY): Payer: Self-pay | Admitting: General Surgery

## 2021-10-09 LAB — CBC
HCT: 25.8 % — ABNORMAL LOW (ref 36.0–46.0)
Hemoglobin: 7.8 g/dL — ABNORMAL LOW (ref 12.0–15.0)
MCH: 26.4 pg (ref 26.0–34.0)
MCHC: 30.2 g/dL (ref 30.0–36.0)
MCV: 87.2 fL (ref 80.0–100.0)
Platelets: 284 10*3/uL (ref 150–400)
RBC: 2.96 MIL/uL — ABNORMAL LOW (ref 3.87–5.11)
RDW: 17.1 % — ABNORMAL HIGH (ref 11.5–15.5)
WBC: 7.3 10*3/uL (ref 4.0–10.5)
nRBC: 0 % (ref 0.0–0.2)

## 2021-10-09 LAB — BASIC METABOLIC PANEL
Anion gap: 6 (ref 5–15)
BUN: <5 mg/dL — ABNORMAL LOW (ref 6–20)
CO2: 28 mmol/L (ref 22–32)
Calcium: 8.2 mg/dL — ABNORMAL LOW (ref 8.9–10.3)
Chloride: 106 mmol/L (ref 98–111)
Creatinine, Ser: 0.71 mg/dL (ref 0.44–1.00)
GFR, Estimated: >60 mL/min (ref >60)
Glucose, Bld: 123 mg/dL — ABNORMAL HIGH (ref 70–99)
Potassium: 3.2 mmol/L — ABNORMAL LOW (ref 3.5–5.1)
Sodium: 140 mmol/L (ref 135–145)

## 2021-10-09 MED ORDER — CHLORHEXIDINE GLUCONATE CLOTH 2 % EX PADS
6.0000 | MEDICATED_PAD | Freq: Every day | CUTANEOUS | Status: DC
  Administered 2021-10-09 – 2021-10-10 (×2): 6 via TOPICAL

## 2021-10-09 MED ORDER — KCL IN DEXTROSE-NACL 40-5-0.45 MEQ/L-%-% IV SOLN
INTRAVENOUS | Status: DC
  Filled 2021-10-09: qty 1000

## 2021-10-09 NOTE — Progress Notes (Signed)
1 Day Post-Op Robotic Rectopexy and Resection, Hysterectomy ?Subjective: ?Pain ok except around drain site.  Having BM's ? ?Objective: ?Vital signs in last 24 hours: ?Temp:  [97.5 ?F (36.4 ?C)-98.8 ?F (37.1 ?C)] 98.7 ?F (37.1 ?C) (04/15 6387) ?Pulse Rate:  [68-92] 92 (04/15 0918) ?Resp:  [12-18] 18 (04/15 5643) ?BP: (92-122)/(62-79) 95/66 (04/15 3295) ?SpO2:  [94 %-100 %] 98 % (04/15 0918) ?Weight:  [72.8 kg] 72.8 kg (04/15 0500) ? ? ?Intake/Output from previous day: ?04/14 0701 - 04/15 0700 ?In: 4567.4 [P.O.:960; I.V.:3407.4; IV Piggyback:200] ?Out: 1884 [Urine:3025; Drains:140; Blood:150] ?Intake/Output this shift: ?No intake/output data recorded. ? ? ?General appearance: alert and cooperative ?GI: normal findings: soft, non-distended ? ?Incision: no significant drainage ? ?Lab Results:  ?Recent Labs  ?  10/08/21 ?1660 10/09/21 ?0512  ?WBC 6.2 7.3  ?HGB 9.4* 7.8*  ?HCT 30.0* 25.8*  ?PLT 386 284  ? ?BMET ?Recent Labs  ?  10/08/21 ?6301 10/09/21 ?0512  ?NA 138 140  ?K 2.9* 3.2*  ?CL 105 106  ?CO2 25 28  ?GLUCOSE 148* 123*  ?BUN 5* <5*  ?CREATININE 0.49 0.71  ?CALCIUM 8.8* 8.2*  ? ?PT/INR ?No results for input(s): LABPROT, INR in the last 72 hours. ?ABG ?No results for input(s): PHART, HCO3 in the last 72 hours. ? ?Invalid input(s): PCO2, PO2 ? ?MEDS, Scheduled ? acetaminophen  1,000 mg Oral Q6H  ? alvimopan  12 mg Oral BID  ? Chlorhexidine Gluconate Cloth  6 each Topical Daily  ? enoxaparin (LOVENOX) injection  40 mg Subcutaneous Q24H  ? feeding supplement  237 mL Oral BID BM  ? gabapentin  300 mg Oral BID  ? saccharomyces boulardii  250 mg Oral BID  ? sertraline  100 mg Oral Daily  ? ? ?Studies/Results: ?No results found. ? ?Assessment: ?s/p Procedure(s): ?XI ROBOT ASSISTED LAPAROSCOPIC SIGMOIDECTOMY ?RECTOPEXY ?HERNIA REPAIR UMBILICAL ADULT ?XI ROBOTIC ASSISTED LAPAROSCOPIC HYSTERECTOMY AND SALPINGECTOMY ?Patient Active Problem List  ? Diagnosis Date Noted  ? Menorrhagia 10/08/2021  ? Rectal prolapse 10/08/2021  ?  Acute non-recurrent pansinusitis 08/21/2021  ? Nasal mucositis (ulcerative) 08/21/2021  ? Prolactin increased 02/21/2021  ? 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 Primary insomnia 02/16/2021  ? Upper respiratory tract infection due to COVID-19 virus 01/21/2021  ? Alcohol use disorder, moderate, dependence ([place]) 12/02/2020  ? Body mass index 28.0-28.9, adult 01/11/2019  ? Hypokalemia 01/11/2019  ? S/P right THA, AA 01/10/2019  ? Cough 07/09/2018  ? Hyperprolactinemia ([place]) 03/06/2018  ? Pain in joint of right hip 12/25/2017  ? Environmental and seasonal allergies 11/15/2017  ? Chronic pansinusitis 11/15/2017  ? Cervical disc disorder 11/15/2017  ? Muscle spasm- neck muscles 11/15/2017  ? H/O: pituitary tumor 09/06/2017  ? Sleeping difficulty 09/06/2017  ? Subclinical hypothyroidism 09/06/2017  ? Generalized osteoarthritis of multiple sites 09/06/2017  ? Acute reaction to situational stress 05/16/2017  ? GAD (generalized anxiety disorder) 05/16/2017  ? Stress-related physiological response affecting physical condition 05/16/2017  ? Numbness and tingling in both arms/ hands 05/16/2017  ? Non-cardiac chest pain- assoc with emotional stress 05/16/2017  ? Palpitations 01/18/2016  ? Family history of stroke (cerebrovascular)- mother 57's 01/18/2016  ? Family history of atrial fibrillation 01/18/2016  ? Tinea corporis (h/o tinea versicolor per pt) 12/20/2015  ? Seborrheic dermatitis of scalp 12/16/2015  ? GERD (gastroesophageal reflux disease) 12/16/2015  ? ? ?Expected post op course ? ? ?Plan: ?Advance diet to soft foods ?SL IVF's ?Ambulate in hall ?D/c foley in AM ? ?  LOS: 1 day  ? ? ? ?Marland KitchenRosario Adie, MD ?[facility], [place] ? ? ? ?10/09/2021 ?9:54 AM ? ? ? ?  ?

## 2021-10-09 NOTE — Op Note (Signed)
NAME: Connie Cole, Connie Cole ?MEDICAL RECORD NO: [id number] ?ACCOUNT NO: [id number] ?DATE OF BIRTH: 30-Oct-1970 ?FACILITY: [facility] ?LOCATION: [facility] ?PHYSICIAN: Lovenia Kim, MD ? ?Operative Report  ? ?DATE OF PROCEDURE: 10/08/2021 ? ?PREOPERATIVE DIAGNOSES:  Abnormal uterine bleeding, failed endometrial ablation, secondary anemia. ? ?POSTOPERATIVE DIAGNOSES:  Abnormal uterine bleeding, failed endometrial ablation, secondary anemia, pelvic adhesions, likely adenomyosis and right paratubal cyst. ? ?PROCEDURES:  Da Vinci-assisted total laparoscopic hysterectomy, bilateral salpingectomy, lysis of right adnexal adhesions, removal of right paratubal cyst and vaginal morcellation of specimen. ? ?SURGEON:  Dr. Edwyna Ready. ? ?ASSISTANT:  Paul. ? ?ANESTHESIA:  General. ? ?ESTIMATED BLOOD LOSS:  100 mL. ? ?COMPLICATIONS:  None. ? ?DRAINS:  Foley. ? ?COUNTS:  Correct. ? ?SPECIMENS:  Uterus, cervix, bilateral tubes and right paratubal cyst, specimen to pathology. ? ?DISPOSITION:  The patient was taken to recovery in good condition. ? ?BRIEF OPERATIVE NOTE:  After being apprised of the risks of anesthesia, infection, bleeding, injury to surrounding organs, possible need for repair, delayed versus immediate complications to include bowel and bladder injury, possible need for repair. ? ?DESCRIPTION OF PROCEDURE:  The patient was brought to the operating room.  She was administered general anesthetic without complications.  The patient was prepped and draped in usual sterile fashion.  Foley catheter placed.  RUMI retractor was tightened  ?using an anterior and posterior lips suture around the RUMI cup, which was seated with a 10 extender without difficulty.  At this time, attention was turned to the abdominal portion of the procedure, whereby Veress needle placed, opening pressure of 1 is ? noted.  3.5 liters of CO2 insufflated without difficulty.  Trocar placed atraumatically.  Visualization reveals a markedly enlarged uterus, normal tubes, a  right follicular cyst, right paratubal cyst, normal left ovary, normal anterior and posterior  ?cul-de-sac.  Robotic ports were placed, two on the right and one on the left. AirSeal port on the left.  All are docked in the standard fashion after achieving Trendelenburg position. The ProGrasp EndoShears and bipolar forceps were placed.  At this  ?time, it was noted that the right paratubal cyst was removed without difficulty.  The left tube was undermined in the mesosalpinx and divided at the uterotubal junction and excised completely and removed.  The same is done to the right tube as well.  The ? left uteroovarian ligament was cauterized and divided.  The retroperitoneal space was entered.  The ureter was identified.  The round ligament was cauterized on the left. Uterine vessels were skeletonized, cauterized, and cut.  The bladder flap was  ?developed sharply and dissected down off the RUMI cup anteriorly. On the right, the right ovarian ligament was cauterized and cut.  The right round ligament was cauterized and cut.  It was cauterized using bipolar cautery and cut. The retroperitoneal  ?space was entered.  The ureter was identified.  The right uterine vessels were skeletonized in the standard fashion, cauterized using bipolar cautery and divided.  Good hemostasis was noted.  The bladder flap was sharply developed off the RUMI cup.   ?Ureters were seen to be peristalsing normally.  The specimen was then divided at the point of the RUMI cup.  At this time, due to the large nature of the specimen, multiple transfers were done where there was morcellation done vaginally and further  ?morcellation done abdominally, so that the specimen could be reduced and passed through the vagina piecemeal and all sent to pathology.  This procedure took approximately 30 minutes.  At this  time, the vaginal cuff was closed using a 0 V-Loc suture,  ?6-inch in a continuous running fashion.  Second imbricating layer was placed.  No  culdoplasty suture was placed due to the subsequent rectopexy, which is to be performed by Dr. Marcello Moores during the same anesthetic procedure.  Her note will be dictated  ?separately.  At this time, all pedicles were inspected and found to be hemostatic.  The patient tolerated the procedure well and Dr. Marcello Moores takes over the care of the patient after removal of the robotic instruments, but the robot was left docked at this ? time. ? ? ?MUK ?D: 10/08/2021 11:26:58 am T: 10/09/2021 12:28:00 am  ?JOB: [id number]/ [id number]  ?

## 2021-10-10 LAB — BASIC METABOLIC PANEL
Anion gap: 7 (ref 5–15)
BUN: 7 mg/dL (ref 6–20)
CO2: 28 mmol/L (ref 22–32)
Calcium: 8.5 mg/dL — ABNORMAL LOW (ref 8.9–10.3)
Chloride: 107 mmol/L (ref 98–111)
Creatinine, Ser: 0.66 mg/dL (ref 0.44–1.00)
GFR, Estimated: >60 mL/min (ref >60)
Glucose, Bld: 101 mg/dL — ABNORMAL HIGH (ref 70–99)
Potassium: 3.4 mmol/L — ABNORMAL LOW (ref 3.5–5.1)
Sodium: 142 mmol/L (ref 135–145)

## 2021-10-10 LAB — CBC
HCT: 24.2 % — ABNORMAL LOW (ref 36.0–46.0)
Hemoglobin: 7.4 g/dL — ABNORMAL LOW (ref 12.0–15.0)
MCH: 26.5 pg (ref 26.0–34.0)
MCHC: 30.6 g/dL (ref 30.0–36.0)
MCV: 86.7 fL (ref 80.0–100.0)
Platelets: 276 10*3/uL (ref 150–400)
RBC: 2.79 MIL/uL — ABNORMAL LOW (ref 3.87–5.11)
RDW: 17.4 % — ABNORMAL HIGH (ref 11.5–15.5)
WBC: 7 10*3/uL (ref 4.0–10.5)
nRBC: 0 % (ref 0.0–0.2)

## 2021-10-10 MED ORDER — MAGNESIUM OXIDE -MG SUPPLEMENT 400 (240 MG) MG PO TABS
200.0000 mg | ORAL_TABLET | Freq: Every day | ORAL | Status: DC
Start: 2021-10-10 — End: 2021-10-11
  Administered 2021-10-10: 200 mg via ORAL
  Filled 2021-10-10: qty 1

## 2021-10-10 MED ORDER — IBUPROFEN 400 MG PO TABS
400.0000 mg | ORAL_TABLET | Freq: Four times a day (QID) | ORAL | Status: DC | PRN
  Administered 2021-10-10: 800 mg via ORAL
  Filled 2021-10-10: qty 2

## 2021-10-10 MED ORDER — MENTHOL 3 MG MT LOZG
1.0000 | LOZENGE | OROMUCOSAL | Status: DC | PRN
  Administered 2021-10-10: 3 mg via ORAL
  Filled 2021-10-10: qty 9

## 2021-10-10 MED ORDER — POTASSIUM CHLORIDE CRYS ER 20 MEQ PO TBCR
40.0000 meq | EXTENDED_RELEASE_TABLET | Freq: Every day | ORAL | Status: DC
  Administered 2021-10-10: 40 meq via ORAL
  Filled 2021-10-10: qty 2

## 2021-10-10 NOTE — Progress Notes (Signed)
2 Days Post-Op Robotic Rectopexy and Resection, Hysterectomy ?Subjective: ?Having a bit more pain.  Having loose BM's ? ?Objective: ?Vital signs in last 24 hours: ?Temp:  [98.4 ?F (36.9 ?C)-99.2 ?F (37.3 ?C)] 98.4 ?F (36.9 ?C) (04/16 3785) ?Pulse Rate:  [73-92] 73 (04/16 0538) ?Resp:  [18] 18 (04/16 0538) ?BP: (95-105)/(66-77) 103/71 (04/16 0538) ?SpO2:  [97 %-98 %] 97 % (04/16 0538) ?Weight:  [71.4 kg] 71.4 kg (04/16 0538) ? ? ?Intake/Output from previous day: ?04/15 0701 - 04/16 0700 ?In: 540 [P.O.:540] ?Out: 8850 [Urine:4750; Drains:70] ?Intake/Output this shift: ?No intake/output data recorded. ? ? ?General appearance: alert and cooperative ?GI: normal findings: soft, non-distended ? ?Incision: no significant drainage ? ?Lab Results:  ?Recent Labs  ?  10/09/21 ?2774 10/10/21 ?0424  ?WBC 7.3 7.0  ?HGB 7.8* 7.4*  ?HCT 25.8* 24.2*  ?PLT 284 276  ? ? ?BMET ?Recent Labs  ?  10/09/21 ?1287 10/10/21 ?0424  ?NA 140 142  ?K 3.2* 3.4*  ?CL 106 107  ?CO2 28 28  ?GLUCOSE 123* 101*  ?BUN <5* 7  ?CREATININE 0.71 0.66  ?CALCIUM 8.2* 8.5*  ? ? ?PT/INR ?No results for input(s): LABPROT, INR in the last 72 hours. ?ABG ?No results for input(s): PHART, HCO3 in the last 72 hours. ? ?Invalid input(s): PCO2, PO2 ? ?MEDS, Scheduled ? acetaminophen  1,000 mg Oral Q6H  ? alvimopan  12 mg Oral BID  ? Chlorhexidine Gluconate Cloth  6 each Topical Daily  ? enoxaparin (LOVENOX) injection  40 mg Subcutaneous Q24H  ? feeding supplement  237 mL Oral BID BM  ? gabapentin  300 mg Oral BID  ? magnesium oxide  200 mg Oral Daily  ? potassium chloride  40 mEq Oral Daily  ? saccharomyces boulardii  250 mg Oral BID  ? sertraline  100 mg Oral Daily  ? ? ?Studies/Results: ?No results found. ? ?Assessment: ?s/p Procedure(s): ?XI ROBOT ASSISTED LAPAROSCOPIC SIGMOIDECTOMY ?RECTOPEXY ?HERNIA REPAIR UMBILICAL ADULT ?XI ROBOTIC ASSISTED LAPAROSCOPIC HYSTERECTOMY AND SALPINGECTOMY ?Patient Active Problem List  ? Diagnosis Date Noted  ? Menorrhagia 10/08/2021  ?  Rectal prolapse 10/08/2021  ? Acute non-recurrent pansinusitis 08/21/2021  ? Nasal mucositis (ulcerative) 08/21/2021  ? Prolactin increased 02/21/2021  ? 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 Primary insomnia 02/16/2021  ? Upper respiratory tract infection due to COVID-19 virus 01/21/2021  ? Alcohol use disorder, moderate, dependence ([place]) 12/02/2020  ? Body mass index 28.0-28.9, adult 01/11/2019  ? Hypokalemia 01/11/2019  ? S/P right THA, AA 01/10/2019  ? Cough 07/09/2018  ? Hyperprolactinemia ([place]) 03/06/2018  ? Pain in joint of right hip 12/25/2017  ? Environmental and seasonal allergies 11/15/2017  ? Chronic pansinusitis 11/15/2017  ? Cervical disc disorder 11/15/2017  ? Muscle spasm- neck muscles 11/15/2017  ? H/O: pituitary tumor 09/06/2017  ? Sleeping difficulty 09/06/2017  ? Subclinical hypothyroidism 09/06/2017  ? Generalized osteoarthritis of multiple sites 09/06/2017  ? Acute reaction to situational stress 05/16/2017  ? GAD (generalized anxiety disorder) 05/16/2017  ? Stress-related physiological response affecting physical condition 05/16/2017  ? Numbness and tingling in both arms/ hands 05/16/2017  ? Non-cardiac chest pain- assoc with emotional stress 05/16/2017  ? Palpitations 01/18/2016  ? Family history of stroke (cerebrovascular)- mother 30's 01/18/2016  ? Family history of atrial fibrillation 01/18/2016  ? Tinea corporis (h/o tinea versicolor per pt) 12/20/2015  ? Seborrheic dermatitis of scalp 12/16/2015  ? GERD (gastroesophageal reflux disease) 12/16/2015  ? ? ?Expected post op course ? ? ?  Plan: ?Cont soft foods ?Cont to ambulate in hall ?Foley out, make sure pt can urinate ?Ibuprofen added for better pain control ?Home potassium and mag supplements reordered ?Acute blood loss anemia with chronic anemia: Recheck labs in AM ? ? LOS: 2 days  ? ? ? ?Marland KitchenRosario Adie, MD ?[facility], [place] ? ? ? ?10/10/2021 ?8:27 AM ? ? ? ?  ?

## 2021-10-10 NOTE — Progress Notes (Signed)
PHARMACIST - PHYSICIAN COMMUNICATION ?DR:   Marcello Moores ?CONCERNING: Alvimopan  ? ?RECOMMENDATION: ?This patient is receiving alvimopan post-operatively.  Based on criteria approved by the Pharmacy and Therapeutics Committee, the medication will be discontinued. ? ?DESCRIPTION: ?These criteria include: ?Patient will receive NO MORE than 15 doses total during current hospitalization ?If bowel recovery (documented return of bowel sounds and a bowel movement confirmed by RN or patient) occurs before completion of 7 days of therapy, a pharmacist may discontinue alvimopan ? ?If you have questions about this conversion, please contact the Pharmacy Department  ?'[]'$   [phone] )  [facility] ?'[]'$   [phone] )  [facility]  ?'[]'$   [phone] )  [facility] ?'[x]'$   [phone] )  [facility]  ? ?4/16: Documented bowel movement ? ?Dimple Nanas, PharmD ?10/10/2021 11:52 AM ? ?

## 2021-10-11 LAB — SURGICAL PATHOLOGY

## 2021-10-11 LAB — BASIC METABOLIC PANEL
Anion gap: 6 (ref 5–15)
BUN: 8 mg/dL (ref 6–20)
CO2: 26 mmol/L (ref 22–32)
Calcium: 8.9 mg/dL (ref 8.9–10.3)
Chloride: 109 mmol/L (ref 98–111)
Creatinine, Ser: 0.54 mg/dL (ref 0.44–1.00)
GFR, Estimated: >60 mL/min (ref >60)
Glucose, Bld: 98 mg/dL (ref 70–99)
Potassium: 4 mmol/L (ref 3.5–5.1)
Sodium: 141 mmol/L (ref 135–145)

## 2021-10-11 LAB — CBC
HCT: 26 % — ABNORMAL LOW (ref 36.0–46.0)
Hemoglobin: 7.7 g/dL — ABNORMAL LOW (ref 12.0–15.0)
MCH: 26.1 pg (ref 26.0–34.0)
MCHC: 29.6 g/dL — ABNORMAL LOW (ref 30.0–36.0)
MCV: 88.1 fL (ref 80.0–100.0)
Platelets: 279 10*3/uL (ref 150–400)
RBC: 2.95 MIL/uL — ABNORMAL LOW (ref 3.87–5.11)
RDW: 17.5 % — ABNORMAL HIGH (ref 11.5–15.5)
WBC: 6.3 10*3/uL (ref 4.0–10.5)
nRBC: 0 % (ref 0.0–0.2)

## 2021-10-11 MED ORDER — OXYCODONE HCL 5 MG PO TABS: 5.0000 mg | ORAL_TABLET | ORAL | 0 refills | Status: DC | PRN

## 2021-10-11 MED ORDER — METHOCARBAMOL 500 MG PO TABS: 500.0000 mg | ORAL_TABLET | Freq: Four times a day (QID) | ORAL | 0 refills | Status: DC

## 2021-10-11 NOTE — Progress Notes (Signed)
DC instructions and packet given to pt, pt verbalized understanding, IV removed, pt taken downstairs by staff member  ?

## 2021-10-11 NOTE — Discharge Summary (Signed)
Physician Discharge Summary  ?Patient ID: ?Connie Cole ?MRN: [id number] ?DOB/AGE: 1970/11/08 51 y.o. ? ?Admit date: 10/08/2021 ?Discharge date: 10/11/2021 ? ?Admission Diagnoses: Rectal prolapse, Menorrhagia ? ?Discharge Diagnoses:  ?Principal Problem: ?  Menorrhagia ?Active Problems: ?  Rectal prolapse ? ? ?Discharged Condition: good ? ?Hospital Course: Patient was admitted to the med surg floor after surgery.  Diet was advanced as tolerated.  Patient began to have bowel function on postop day 1.  By postop day 3, she was tolerating a solid diet and pain was controlled with oral medications.  She was urinating without difficulty and ambulating without assistance.  Patient was felt to be in stable condition for discharge to home.  ? ?Consults: None ? ?Significant Diagnostic Studies: labs: cbc, bmet ? ?Treatments: IV hydration, analgesia: oxycodone, and surgery: robotic hysterectomy, sigmoidectomy and rectopexy ? ?Discharge Exam: ?Blood pressure 97/66, pulse 69, temperature 98.1 ?F (36.7 ?C), temperature source Oral, resp. rate 16, weight 71.7 kg, last menstrual period 09/22/2021, SpO2 97 %. ?General appearance: alert and cooperative ?GI: soft, non-distended ?Incision/Wound: clean, dry, intact ? ?Disposition: Discharge disposition: 01-Home or Self Care ? ? ? ? ? ? ? ?Allergies as of 10/11/2021   ? ?   Reactions  ? Latex Swelling  ? Latex Rash  ? ?  ? ?  ?Medication List  ?  ? ?TAKE these medications   ? ?KRILL OIL PO ?Take 1 tablet by mouth daily. ?  ?MAGNESIUM PO ?Take 1 tablet by mouth daily. Potassium ?  ?methocarbamol 500 MG tablet ?Commonly known as: Robaxin ?Take 1 tablet (500 mg total) by mouth 4 (four) times daily. ?  ?mupirocin ointment 2 % ?Commonly known as: BACTROBAN ?Place 1 application into the nose 2 (two) times daily. ?  ?naltrexone 50 MG tablet ?Commonly known as: DEPADE ?Take 1 tablet (50 mg total) by mouth daily. ?  ?OVER THE COUNTER MEDICATION ?Place 1 patch onto the skin daily. Lifewave X39 stem  cell Patch ?  ?OVER THE COUNTER MEDICATION ?Take 1 tablet by mouth daily. Instaflex joint support ?  ?oxyCODONE 5 MG immediate release tablet ?Commonly known as: Oxy IR/ROXICODONE ?Take 1 tablet (5 mg total) by mouth every 4 (four) hours as needed for moderate pain. ?  ?sertraline 100 MG tablet ?Commonly known as: ZOLOFT ?Take 1.5 tablets (150 mg total) by mouth daily. ?What changed: how much to take ?  ? ?  ? ? Follow-up Information   ? ? Leighton Ruff, MD. Schedule an appointment as soon as possible for a visit in 2 week(s).   ?Specialties: General Surgery, Colon and Rectal Surgery ?Contact information: ?[address] ?[address] ?[place] [address] ?[phone] ? ? ?  ?  ? ?  ?  ? ?  ? ? ?Signed: ?Rosario Adie ?10/11/2021, 8:32 AM ? ? ?

## 2021-10-11 NOTE — Discharge Instructions (Signed)
SURGERY: POST OP INSTRUCTIONS (Surgery for small bowel obstruction, colon resection, etc)   ######################################################################  EAT Gradually transition to a high fiber diet with a fiber supplement over the next few days after discharge  WALK Walk an hour a day.  Control your pain to do that.    CONTROL PAIN Control pain so that you can walk, sleep, tolerate sneezing/coughing, go up/down stairs.  HAVE A BOWEL MOVEMENT DAILY Keep your bowels regular to avoid problems.  OK to try a laxative to override constipation.  OK to use an antidairrheal to slow down diarrhea.  Call if not better after 2 tries  CALL IF YOU HAVE PROBLEMS/CONCERNS Call if you are still struggling despite following these instructions. Call if you have concerns not answered by these instructions  ######################################################################   DIET Follow a light diet the first few days at home.  Start with a bland diet such as soups, liquids, starchy foods, low fat foods, etc.  If you feel full, bloated, or constipated, stay on a ful liquid or pureed/blenderized diet for a few days until you feel better and no longer constipated. Be sure to drink plenty of fluids every day to avoid getting dehydrated (feeling dizzy, not urinating, etc.). Gradually add a fiber supplement to your diet over the next week.  Gradually get back to a regular solid diet.  Avoid fast food or heavy meals the first week as you are more likely to get nauseated. It is expected for your digestive tract to need a few months to get back to normal.  It is common for your bowel movements and stools to be irregular.  You will have occasional bloating and cramping that should eventually fade away.  Until you are eating solid food normally, off all pain medications, and back to regular activities; your bowels will not be normal. Focus on eating a low-fat, high fiber diet the rest of your life  (See Getting to Good Bowel Health, below).  CARE of your INCISION or WOUND  It is good for closed incisions and even open wounds to be washed every day.  Shower every day.  Short baths are fine.  Wash the incisions and wounds clean with soap & water.    You may leave closed incisions open to air if it is dry.   You may cover the incision with clean gauze & replace it after your daily shower for comfort.  STAPLES: You have skin staples.  Leave them in place & set up an appointment for them to be removed by a surgery office nurse ~10 days after surgery. = 1st week of January 2024    ACTIVITIES as tolerated Start light daily activities --- self-care, walking, climbing stairs-- beginning the day after surgery.  Gradually increase activities as tolerated.  Control your pain to be active.  Stop when you are tired.  Ideally, walk several times a day, eventually an hour a day.   Most people are back to most day-to-day activities in a few weeks.  It takes 4-8 weeks to get back to unrestricted, intense activity. If you can walk 30 minutes without difficulty, it is safe to try more intense activity such as jogging, treadmill, bicycling, low-impact aerobics, swimming, etc. Save the most intensive and strenuous activity for last (Usually 4-8 weeks after surgery) such as sit-ups, heavy lifting, contact sports, etc.  Refrain from any intense heavy lifting or straining until you are off narcotics for pain control.  You will have off days, but things should improve   week-by-week. DO NOT PUSH THROUGH PAIN.  Let pain be your guide: If it hurts to do something, don't do it.  Pain is your body warning you to avoid that activity for another week until the pain goes down. You may drive when you are no longer taking narcotic prescription pain medication, you can comfortably wear a seatbelt, and you can safely make sudden turns/stops to protect yourself without hesitating due to pain. You may have sexual intercourse when it  is comfortable. If it hurts to do something, stop.  MEDICATIONS Take your usually prescribed home medications unless otherwise directed.   Blood thinners:  Usually you can restart any strong blood thinners after the second postoperative day.  It is OK to take aspirin right away.     If you are on strong blood thinners (warfarin/Coumadin, Plavix, Xerelto, Eliquis, Pradaxa, etc), discuss with your surgeon, medicine PCP, and/or cardiologist for instructions on when to restart the blood thinner & if blood monitoring is needed (PT/INR blood check, etc).     PAIN CONTROL Pain after surgery or related to activity is often due to strain/injury to muscle, tendon, nerves and/or incisions.  This pain is usually short-term and will improve in a few months.  To help speed the process of healing and to get back to regular activity more quickly, DO THE FOLLOWING THINGS TOGETHER: Increase activity gradually.  DO NOT PUSH THROUGH PAIN Use Ice and/or Heat Try Gentle Massage and/or Stretching Take over the counter pain medication Take Narcotic prescription pain medication for more severe pain  Good pain control = faster recovery.  It is better to take more medicine to be more active than to stay in bed all day to avoid medications.  Increase activity gradually Avoid heavy lifting at first, then increase to lifting as tolerated over the next 6 weeks. Do not "push through" the pain.  Listen to your body and avoid positions and maneuvers than reproduce the pain.  Wait a few days before trying something more intense Walking an hour a day is encouraged to help your body recover faster and more safely.  Start slowly and stop when getting sore.  If you can walk 30 minutes without stopping or pain, you can try more intense activity (running, jogging, aerobics, cycling, swimming, treadmill, sex, sports, weightlifting, etc.) Remember: If it hurts to do it, then don't do it! Use Ice and/or Heat You will have swelling and  bruising around the incisions.  This will take several weeks to resolve. Ice packs or heating pads (6-8 times a day, 30-60 minutes at a time) will help sooth soreness & bruising. Some people prefer to use ice alone, heat alone, or alternate between ice & heat.  Experiment and see what works best for you.  Consider trying ice for the first few days to help decrease swelling and bruising; then, switch to heat to help relax sore spots and speed recovery. Shower every day.  Short baths are fine.  It feels good!  Keep the incisions and wounds clean with soap & water.   Try Gentle Massage and/or Stretching Massage at the area of pain many times a day Stop if you feel pain - do not overdo it Take over the counter pain medication This helps the muscle and nerve tissues become less irritable and calm down faster Choose ONE of the following over-the-counter anti-inflammatory medications: Acetaminophen 500mg tabs (Tylenol) 1-2 pills with every meal and just before bedtime (avoid if you have liver problems or if you have   acetaminophen in you narcotic prescription) Naproxen 220mg tabs (ex. Aleve, Naprosyn) 1-2 pills twice a day (avoid if you have kidney, stomach, IBD, or bleeding problems) Ibuprofen 200mg tabs (ex. Advil, Motrin) 3-4 pills with every meal and just before bedtime (avoid if you have kidney, stomach, IBD, or bleeding problems) Take with food/snack several times a day as directed for at least 2 weeks to help keep pain / soreness down & more manageable. Take Narcotic prescription pain medication for more severe pain A prescription for strong pain control is often given to you upon discharge (for example: oxycodone/Percocet, hydrocodone/Norco/Vicodin, or tramadol/Ultram) Take your pain medication as prescribed. Be mindful that most narcotic prescriptions contain Tylenol (acetaminophen) as well - avoid taking too much Tylenol. If you are having problems/concerns with the prescription medicine (does  not control pain, nausea, vomiting, rash, itching, etc.), please call us [phone] to see if we need to switch you to a different pain medicine that will work better for you and/or control your side effects better. If you need a refill on your pain medication, you must call the office before 4 pm and on weekdays only.  By federal law, prescriptions for narcotics cannot be called into a pharmacy.  They must be filled out on paper & picked up from our office by the patient or authorized caretaker.  Prescriptions cannot be filled after 4 pm nor on weekends.    WHEN TO CALL US [phone] Severe uncontrolled or worsening pain  Fever over 101 F (38.5 C) Concerns with the incision: Worsening pain, redness, rash/hives, swelling, bleeding, or drainage Reactions / problems with new medications (itching, rash, hives, nausea, etc.) Nausea and/or vomiting Difficulty urinating Difficulty breathing Worsening fatigue, dizziness, lightheadedness, blurred vision Other concerns If you are not getting better after two weeks or are noticing you are getting worse, contact our office [phone] for further advice.  We may need to adjust your medications, re-evaluate you in the office, send you to the emergency room, or see what other things we can do to help. The clinic staff is available to answer your questions during regular business hours (8:30am-5pm).  Please don't hesitate to call and ask to speak to one of our nurses for clinical concerns.    A surgeon from [facility] is always on call at the hospitals 24 hours/day If you have a medical emergency, go to the nearest emergency room or call 911.  FOLLOW UP in our office One the day of your discharge from the hospital (or the next business weekday), please call [facility] to set up or confirm an appointment to see your surgeon in the office for a follow-up appointment.  Usually it is 2-3 weeks after your surgery.   If you  have skin staples at your incision(s), let the office know so we can set up a time in the office for the nurse to remove them (usually around 10 days after surgery). Make sure that you call for appointments the day of discharge (or the next business weekday) from the hospital to ensure a convenient appointment time. IF YOU HAVE DISABILITY OR FAMILY LEAVE FORMS, BRING THEM TO THE OFFICE FOR PROCESSING.  DO NOT GIVE THEM TO YOUR DOCTOR.  [facility] [address], [address], [place], [place]  [address] ? [phone] - Main [phone] - Toll Free,  [phone] - Fax [email or web address]    GETTING TO GOOD BOWEL HEALTH. It is expected for your digestive tract to   need a few months to get back to normal.  It is common for your bowel movements and stools to be irregular.  You will have occasional bloating and cramping that should eventually fade away.  Until you are eating solid food normally, off all pain medications, and back to regular activities; your bowels will not be normal.   Avoiding constipation The goal: ONE SOFT BOWEL MOVEMENT A DAY!    Drink plenty of fluids.  Choose water first. TAKE A FIBER SUPPLEMENT EVERY DAY THE REST OF YOUR LIFE During your first week back home, gradually add back a fiber supplement every day Experiment which form you can tolerate.   There are many forms such as powders, tablets, wafers, gummies, etc Psyllium bran (Metamucil), methylcellulose (Citrucel), Miralax or Glycolax, Benefiber, Flax Seed.  Adjust the dose week-by-week (1/2 dose/day to 6 doses a day) until you are moving your bowels 1-2 times a day.  Cut back the dose or try a different fiber product if it is giving you problems such as diarrhea or bloating. Sometimes a laxative is needed to help jump-start bowels if constipated until the fiber supplement can help regulate your bowels.  If you are tolerating eating & you are farting, it is okay to try a gentle  laxative such as double dose MiraLax, prune juice, or Milk of Magnesia.  Avoid using laxatives too often. Stool softeners can sometimes help counteract the constipating effects of narcotic pain medicines.  It can also cause diarrhea, so avoid using for too long. If you are still constipated despite taking fiber daily, eating solids, and a few doses of laxatives, call our office. Controlling diarrhea Try drinking liquids and eating bland foods for a few days to avoid stressing your intestines further. Avoid dairy products (especially milk & ice cream) for a short time.  The intestines often can lose the ability to digest lactose when stressed. Avoid foods that cause gassiness or bloating.  Typical foods include beans and other legumes, cabbage, broccoli, and dairy foods.  Avoid greasy, spicy, fast foods.  Every person has some sensitivity to other foods, so listen to your body and avoid those foods that trigger problems for you. Probiotics (such as active yogurt, Align, etc) may help repopulate the intestines and colon with normal bacteria and calm down a sensitive digestive tract Adding a fiber supplement gradually can help thicken stools by absorbing excess fluid and retrain the intestines to act more normally.  Slowly increase the dose over a few weeks.  Too much fiber too soon can backfire and cause cramping & bloating. It is okay to try and slow down diarrhea with a few doses of antidiarrheal medicines.   Bismuth subsalicylate (ex. Kayopectate, Pepto Bismol) for a few doses can help control diarrhea.  Avoid if pregnant.   Loperamide (Imodium) can slow down diarrhea.  Start with one tablet (2mg) first.  Avoid if you are having fevers or severe pain.  ILEOSTOMY PATIENTS WILL HAVE CHRONIC DIARRHEA since their colon is not in use.    Drink plenty of liquids.  You will need to drink even more glasses of water/liquid a day to avoid getting dehydrated. Record output from your ileostomy.  Expect to empty  the bag every 3-4 hours at first.  Most people with a permanent ileostomy empty their bag 4-6 times at the least.   Use antidiarrheal medicine (especially Imodium) several times a day to avoid getting dehydrated.  Start with a dose at bedtime & breakfast.  Adjust up or   down as needed.  Increase antidiarrheal medications as directed to avoid emptying the bag more than 8 times a day (every 3 hours). Work with your wound ostomy nurse to learn care for your ostomy.  See ostomy care instructions. TROUBLESHOOTING IRREGULAR BOWELS 1) Start with a soft & bland diet. No spicy, greasy, or fried foods.  2) Avoid gluten/wheat or dairy products from diet to see if symptoms improve. 3) Miralax 17gm or flax seed mixed in 8oz. water or juice-daily. May use 2-4 times a day as needed. 4) Gas-X, Phazyme, etc. as needed for gas & bloating.  5) Prilosec (omeprazole) over-the-counter as needed 6)  Consider probiotics (Align, Activa, etc) to help calm the bowels down  Call your doctor if you are getting worse or not getting better.  Sometimes further testing (cultures, endoscopy, X-ray studies, CT scans, bloodwork, etc.) may be needed to help diagnose and treat the cause of the diarrhea. [facility] [address], [address], [place], [place]  [address] [phone] - Main.    [phone]  - Toll Free.   [phone] - Fax [email or web address]   ###############################   #######################################################  Ostomy Support Information  You've heard that people get along just fine with only one of their eyes, or one of their lungs, or one of their kidneys. But you also know that you have only one intestine and only one bladder, and that leaves you feeling awfully empty, both physically and emotionally: You think no other people go around without part of their intestine with the ends of their intestines sticking out through their abdominal walls.    YOU ARE NOT ALONE.  There are nearly three quarters of a million people in the [place] who have an ostomy; people who have had surgery to remove all or part of their colons or bladders.   There is even a national association, the [employer] with over 350 local affiliated support groups that are organized by volunteers who provide peer support and counseling. [employer] has a toll free telephone num-ber, 800-826-0826 and an educational, interactive website, www.ostomy.org   An ostomy is an opening in the belly (abdominal wall) made by surgery. Ostomates are people who have had this procedure. The opening (stoma) allows the kidney or bowel to grdischarge waste. An external pouch covers the stoma to collect waste. Pouches are are a simple bag and are odor free. Different companies have disposable or reusable pouches to fit one's lifestyle. An ostomy can either be temporary or permanent.   THERE ARE THREE MAIN TYPES OF OSTOMIES Colostomy. A colostomy is a surgically created opening in the large intestine (colon). Ileostomy. An ileostomy is a surgically created opening in the small intestine. Urostomy. A urostomy is a surgically created opening to divert urine away from the bladder.  OSTOMY Care  The following guidelines will make care of your colostomy easier. Keep this information close by for quick reference.  Helpful DIET hints Eat a well-balanced diet including vegetables and fresh fruits. Eat on a regular schedule.  Drink at least 6 to 8 glasses of fluids daily. Eat slowly in a relaxed atmosphere. Chew your food thoroughly. Avoid chewing gum, smoking, and drinking from a straw. This will help decrease the amount of air you swallow, which may help reduce gas. Eating yogurt or drinking buttermilk may help reduce gas.  To control gas at night, do not eat after 8 p.m. This will give your bowel time to quiet down before you go   to bed.  If gas is a problem, you can purchase  Beano. Sprinkle Beano on the first bite of food before eating to reduce gas. It has no flavor and should not change the taste of your food. You can buy Beano over the counter at your local drugstore.  Foods like fish, onions, garlic, broccoli, asparagus, and cabbage produce odor. Although your pouch is odor-proof, if you eat these foods you may notice a stronger odor when emptying your pouch. If this is a concern, you may want to limit these foods in your diet.  If you have an ileostomy, you will have chronic diarrhea & need to drink more liquids to avoid getting dehydrated.  Consider antidiarrheal medicine like imodium (loperamide) or Lomotil to help slow down bowel movements / diarrhea into your ileostomy bag.  GETTING TO GOOD BOWEL HEALTH WITH AN ILEOSTOMY    With the colon bypassed & not in use, you will have small bowel diarrhea.   It is important to thicken & slow your bowel movements down.   The goal: 4-6 small BOWEL MOVEMENTS A DAY It is important to drink plenty of liquids to avoid getting dehydrated  CONTROLLING ILEOSTOMY DIARRHEA  TAKE A FIBER SUPPLEMENT (FiberCon or Benefiner soluble fiber) twice a day - to thicken stools by absorbing excess fluid and retrain the intestines to act more normally.  Slowly increase the dose over a few weeks.  Too much fiber too soon can backfire and cause cramping & bloating.  TAKE AN IRON SUPPLEMENT twice a day to naturally constipate your bowels.  Usually ferrous sulfate 325mg twice a day)  TAKE ANTI-DIARRHEAL MEDICINES: Loperamide (Imodium) can slow down diarrhea.  Start with two tablets (= 4mg) first and then try one tablet every 6 hours.  Can go up to 2 pills four times day (8 pills of 2mg max) Avoid if you are having fevers or severe pain.  If you are not better or start feeling worse, stop all medicines and call your doctor for advice LoMotil (Diphenoxylate / Atropine) is another medicine that can constipate & slow down bowel moevements Pepto  Bismol (bismuth) can gently thicken bowels as well  If diarrhea is worse,: drink plenty of liquids and try simpler foods for a few days to avoid stressing your intestines further. Avoid dairy products (especially milk & ice cream) for a short time.  The intestines often can lose the ability to digest lactose when stressed. Avoid foods that cause gassiness or bloating.  Typical foods include beans and other legumes, cabbage, broccoli, and dairy foods.  Every person has some sensitivity to other foods, so listen to our body and avoid those foods that trigger problems for you.Call your doctor if you are getting worse or not better.  Sometimes further testing (cultures, endoscopy, X-ray studies, bloodwork, etc) may be needed to help diagnose and treat the cause of the diarrhea. Take extra anti-diarrheal medicines (maximum is 8 pills of 2mg loperamide a day)   Tips for POUCHING an OSTOMY   Changing Your Pouch The best time to change your pouch is in the morning, before eating or drinking anything. Your stoma can function at any time, but it will function more after eating or drinking.   Applying the pouching system  Place all your equipment close at hand before removing your pouch.  Wash your hands.  Stand or sit in front of a mirror. Use the position that works best for you. Remember that you must keep the skin around the stoma   wrinkle-free for a good seal.  Gently remove the used pouch (1-piece system) or the pouch and old wafer (2-piece system). Empty the pouch into the toilet. Save the closure clip to use again.  Wash the stoma itself and the skin around the stoma. Your stoma may bleed a little when being washed. This is normal. Rinse and pat dry. You may use a wash cloth or soft paper towels (like Bounty), mild soap (like Dial, Safeguard, or Ivory), and water. Avoid soaps that contain perfumes or lotions.  For a new pouch (1-piece system) or a new wafer (2-piece system), measure your  stoma using the stoma guide in each box of supplies.  Trace the shape of your stoma onto the back of the new pouch or the back of the new wafer. Cut out the opening. Remove the paper backing and set it aside.  Optional: Apply a skin barrier powder to surrounding skin if it is irritated (bare or weeping), and dust off the excess. Optional: Apply a skin-prep wipe (such as Skin Prep or All-Kare) to the skin around the stoma, and let it dry. Do not apply this solution if the skin is irritated (red, tender, or broken) or if you have shaved around the stoma. Optional: Apply a skin barrier paste (such as Stomahesive, Coloplast, or Premium) around the opening cut in the back of the pouch or wafer. Allow it to dry for 30 to 60 seconds.  Hold the pouch (1-piece system) or wafer (2-piece system) with the sticky side toward your body. Make sure the skin around the stoma is wrinkle-free. Center the opening on the stoma, then press firmly to your abdomen (Fig. 4). Look in the mirror to check if you are placing the pouch, or wafer, in the right position. For a 2-piece system, snap the pouch onto the wafer. Make sure it snaps into place securely.  Place your hand over the stoma and the pouch or wafer for about 30 seconds. The heat from your hand can help the pouch or wafer stick to your skin.  Add deodorant (such as Super Banish or Nullo) to your pouch. Other options include food extracts such as vanilla oil and peppermint extract. Add about 10 drops of the deodorant to the pouch. Then apply the closure clamp. Note: Do not use toxic  chemicals or commercial cleaning agents in your pouch. These substances may harm the stoma.  Optional: For extra seal, apply tape to all 4 sides around the pouch or wafer, as if you were framing a picture. You may use any brand of medical adhesive tape. Change your pouch every 5 to 7 days. Change it immediately if a leak occurs.  Wash your hands afterwards.  If you are wearing a  2-piece system, you may use 2 new pouches per week and alternate them. Rinse the pouch with mild soap and warm water and hang it to dry for the next day. Apply the fresh pouch. Alternate the 2 pouches like this for a week. After a week, change the wafer and begin with 2 new pouches. Place the old pouches in a plastic bag, and put them in the trash.   LIVING WITH AN OSTOMY  Emptying Your Pouch Empty your pouch when it is one-third full (of urine, stool, and/or gas). If you wait until your pouch is fuller than this, it will be more difficult to empty and more noticeable. When you empty your pouch, either put toilet paper in the toilet bowl first, or flush the   toilet while you empty the pouch. This will reduce splashing. You can empty the pouch between your legs or to one side while sitting, or while standing or stooping. If you have a 2-piece system, you can snap off the pouch to empty it. Remember that your stoma may function during this time. If you wish to rinse your pouch after you empty it, a turkey baster can be helpful. When using a baster, squirt water up into the pouch through the opening at the bottom. With a 2-piece system, you can snap off the pouch to rinse it. After rinsing  your pouch, empty it into the toilet. When rinsing your pouch at home, put a few granules of Dreft soap in the rinse water. This helps lubricate and freshen your pouch. The inside of your pouch can be sprayed with non-stick cooking oil (Pam spray). This may help reduce stool sticking to the inside of the pouch.  Bathing You may shower or bathe with your pouch on or off. Remember that your stoma may function during this time.  The materials you use to wash your stoma and the skin around it should be clean, but they do not need to be sterile.  Wearing Your Pouch During hot weather, or if you perspire a lot in general, wear a cover over your pouch. This may prevent a rash on your skin under the pouch. Pouch covers are  sold at ostomy supply stores. Wear the pouch inside your underwear for better support. Watch your weight. Any gain or loss of 10 to 15 pounds or more can change the way your pouch fits.  Going Away From Home A collapsible cup (like those that come in travel kits) or a soft plastic squirt bottle with a pull-up top (like a travel bottle for shampoo) can be used for rinsing your pouch when you are away from home. Tilt the opening of the pouch at an upward angle when using a cup to rinse.  Carry wet wipes or extra tissues to use in public bathrooms.  Carry an extra pouching system with you at all times.  Never keep ostomy supplies in the glove compartment of your car. Extreme heat or cold can damage the skin barriers and adhesive wafers on the pouch.  When you travel, carry your ostomy supplies with you at all times. Keep them within easy reach. Do not pack ostomy supplies in baggage that will be checked or otherwise separated from you, because your baggage might be lost. If you're traveling out of the country, it is helpful to have a letter stating that you are carrying ostomy supplies as a medical necessity.  If you need ostomy supplies while traveling, look in the yellow pages of the telephone book under "Surgical Supplies." Or call the local ostomy organization to find out where supplies are available.  Do not let your ostomy supplies get low. Always order new pouches before you use the last one.  Reducing Odor Limit foods such as broccoli, cabbage, onions, fish, and garlic in your diet to help reduce odor. Each time you empty your pouch, carefully clean the opening of the pouch, both inside and outside, with toilet paper. Rinse your pouch 1 or 2 times daily after you empty it (see directions for emptying your pouch and going away from home). Add deodorant (such as Super Banish or Nullo) to your pouch. Use air deodorizers in your bathroom. Do not add aspirin to your pouch. Even though  aspirin can help prevent odor, it   could cause ulcers on your stoma.  When to call the doctor Call the doctor if you have any of the following symptoms: Purple, black, or white stoma Severe cramps lasting more than 6 hours Severe watery discharge from the stoma lasting more than 6 hours No output from the colostomy for 3 days Excessive bleeding from your stoma Swelling of your stoma to more than 1/2-inch larger than usual Pulling inward of your stoma below skin level Severe skin irritation or deep ulcers Bulging or other changes in your abdomen  When to call your ostomy nurse Call your ostomy/enterostomal therapy (WOCN) nurse if any of the following occurs: Frequent leaking of your pouching system Change in size or appearance of your stoma, causing discomfort or problems with your pouch Skin rash or rawness Weight gain or loss that causes problems with your pouch     FREQUENTLY ASKED QUESTIONS   Why haven't you met any of these folks who have an ostomy?  Well, maybe you have! You just did not recognize them because an ostomy doesn't show. It can be kept secret if you wish. Why, maybe some of your best friends, office associates or neighbors have an ostomy ... you never can tell. People facing ostomy surgery have many quality-of-life questions like: Will you bulge? Smell? Make noises? Will you feel waste leaving your body? Will you be a captive of the toilet? Will you starve? Be a social outcast? Get/stay married? Have babies? Easily bathe, go swimming, bend over?  OK, let's look at what you can expect:   Will you bulge?  Remember, without part of the intestine or bladder, and its contents, you should have a flatter tummy than before. You can expect to wear, with little exception, what you wore before surgery ... and this in-cludes tight clothing and bathing suits.   Will you smell?  Today, thanks to modern odor proof pouching systems, you can walk into an ostomy support group  meeting and not smell anything that is foul or offensive. And, for those with an ileostomy or colostomy who are concerned about odor when emptying their pouch, there are in-pouch deodorants that can be used to eliminate any waste odors that may exist.   Will you make noises?  Everyone produces gas, especially if they are an air-swallower. But intestinal sounds that occur from time to time are no differ-ent than a gurgling tummy, and quite often your clothing will muffle any sounds.   Will you feel the waste discharges?  For those with a colostomy or ileostomy there might be a slight pressure when waste leaves your body, but understand that the intestines have no nerve endings, so there will be no unpleasant sensations. Those with a urostomy will probably be unaware of any kidney drainage.   Will you be a captive of the toilet?  Immediately post-op you will spend more time in the bathroom than you will after your body recovers from surgery. Every person is different, but on average those with an ileostomy or urostomy may empty their pouches 4 to 6 times a day; a little  less if you have a colostomy. The average wear time between pouch system changes is 3 to 5 days and the changing process should take less than 30 minutes.   Will I need to be on a special diet? Most people return to their normal diet when they have recovered from surgery. Be sure to chew your food well, eat a well-balanced diet and drink plenty of fluids. If   you experience problems with a certain food, wait a couple of weeks and try it again.  Will there be odor and noises? Pouching systems are designed to be odor-proof or odor-resistant. There are deodorants that can be used in the pouch. Medications are also available to help reduce odor. Limit gas-producing foods and carbonated beverages. You will experience less gas and fewer noises as you heal from surgery.  How much time will it take to care for my ostomy? At first, you may  spend a lot of time learning about your ostomy and how to take care of it. As you become more comfortable and skilled at changing the pouching system, it will take very little time to care for it.   Will I be able to return to work? People with ostomies can perform most jobs. As soon as you have healed from surgery, you should be able to return to work. Heavy lifting (more than 10 pounds) may be discouraged.   What about intimacy? Sexual relationships and intimacy are important and fulfilling aspects of your life. They should continue after ostomy surgery. Intimacy-related concerns should be discussed openly between you and your partner.   Can I wear regular clothing? You do not need to wear special clothing. Ostomy pouches are fairly flat and barely noticeable. Elastic undergarments will not hurt the stoma or prevent the ostomy from functioning.   Can I participate in sports? An ostomy should not limit your involvement in sports. Many people with ostomies are runners, skiers, swimmers or participate in other active lifestyles. Talk with your caregiver first before doing heavy physical activity.  Will you starve?  Not if you follow doctor's orders at each stage of your post-op adjustment. There is no such thing as an "ostomy diet". Some people with an ostomy will be able to eat and tolerate anything; others may find diffi-culty with some foods. Each person is an individual and must determine, by trial, what is best for them. A good practice for all is to drink plenty of water.   Will you be a social outcast?  Have you met anyone who has an ostomy and is a social outcast? Why should you be the first? Only your attitude and self image will effect how you are treated. No confi-dent person is an outcast.    PROFESSIONAL HELP   Resources are available if you need help or have questions about your ostomy.   Specially trained nurses called Wound, Ostomy Continence Nurses (WOCN) are available for  consultation in most major medical centers.  Consider getting an ostomy consult at an outpatient ostomy clinic.   [facility] has an [facility] Clinic run by an WOCN ostomy nurse at the [facility].  [phone]. [facility] can help set up an appointment   The [employer] Ostomy Association (UOA) is a group made up of many local chapters throughout the [place]. These local groups hold meetings and provide support to prospective and existing ostomates. They sponsor educational events and have qualified visitors to make personal or telephone visits. Contact the UOA for the chapter nearest you and for other educational publications.  More detailed information can be found in Colostomy Guide, a publication of the United Ostomy Association (UOA). Contact UOA at 1-800-826-0826 or visit their web site at www.uoaa.org. The website contains links to other sites, suppliers and resources.  [facility] Secure Start Services: Start at the website to enlist for support.  Your Wound Ostomy (WOCN) nurse may have started this   process. https://[email or web address].[employer].com/en/securestart Secure Start services are designed to support people as they live their lives with an ostomy or neurogenic bladder. Enrolling is easy and at no cost to the patient. We realize that each person's needs and life journey are different. Through Secure Start services, we want to help people live their life, their way.  #######################################################  

## 2021-10-12 LAB — TYPE AND SCREEN
ABO/RH(D): A POS
Antibody Screen: NEGATIVE
Unit division: 0
Unit division: 0

## 2021-10-12 LAB — BPAM RBC
Blood Product Expiration Date: 202305012359
Blood Product Expiration Date: 202305042359
Unit Type and Rh: 6200
Unit Type and Rh: 6200

## 2021-10-18 ENCOUNTER — Ambulatory Visit: Payer: BC Managed Care – PPO | Admitting: Nurse Practitioner

## 2021-10-18 NOTE — Progress Notes (Deleted)
Established patient visit   Patient: Connie Cole   DOB: 05/11/1971   51 y.o. Female  MRN: [id number] Visit Date: 10/18/2021   No chief complaint on file.  Subjective    HPI  I-ncreased dose sertraline to '150mg'$  daily at most recent visit.  -history of alcohol abuse  -visit to ER for palpitations -surgery - sigmoidectomy, laparoscopic hysterectomy, hernia repair.  -persistent anemia post operatively. Was transfused 10/12/2021.  -renal functions and electrolytes had returned to normal.    Medications: Outpatient Medications Prior to Visit  Medication Sig   KRILL OIL PO Take 1 tablet by mouth daily.   MAGNESIUM PO Take 1 tablet by mouth daily. Potassium   methocarbamol (ROBAXIN) 500 MG tablet Take 1 tablet (500 mg total) by mouth 4 (four) times daily.   mupirocin ointment (BACTROBAN) 2 % Place 1 application into the nose 2 (two) times daily. (Patient not taking: Reported on 09/29/2021)   naltrexone (DEPADE) 50 MG tablet Take 1 tablet (50 mg total) by mouth daily. (Patient not taking: Reported on 09/29/2021)   OVER THE COUNTER MEDICATION Place 1 patch onto the skin daily. Lifewave X39 stem cell Patch   OVER THE COUNTER MEDICATION Take 1 tablet by mouth daily. Instaflex joint support   oxyCODONE (OXY IR/ROXICODONE) 5 MG immediate release tablet Take 1 tablet (5 mg total) by mouth every 4 (four) hours as needed for moderate pain.   sertraline (ZOLOFT) 100 MG tablet Take 1.5 tablets (150 mg total) by mouth daily. (Patient taking differently: Take 100 mg by mouth daily.)   No facility-administered medications prior to visit.    Review of Systems  {Labs (Optional):23779}   Objective    LMP 09/22/2021  BP Readings from Last 3 Encounters:  10/11/21 97/66  09/30/21 122/89  08/10/21 125/67    Wt Readings from Last 3 Encounters:  10/11/21 158 lb 1.1 oz (71.7 kg)  09/30/21 159 lb (72.1 kg)  08/10/21 160 lb (72.6 kg)    Physical Exam  ***  No results found for any visits on  10/18/21.  Assessment & Plan     Problem List Items Addressed This Visit   None    No follow-ups on file.         Ronnell Freshwater, NP  [facility] [phone] (phone) [phone] (fax)  [place]

## 2021-11-20 ENCOUNTER — Other Ambulatory Visit: Payer: Self-pay | Admitting: Nurse Practitioner

## 2021-11-20 DIAGNOSIS — F411 Generalized anxiety disorder: Secondary | ICD-10-CM

## 2021-12-21 ENCOUNTER — Telehealth: Payer: Self-pay | Admitting: Nurse Practitioner

## 2021-12-21 ENCOUNTER — Other Ambulatory Visit: Payer: Self-pay

## 2021-12-21 DIAGNOSIS — F411 Generalized anxiety disorder: Secondary | ICD-10-CM

## 2021-12-21 MED ORDER — SERTRALINE HCL 100 MG PO TABS: 100.0000 mg | ORAL_TABLET | Freq: Every day | ORAL | 0 refills | Status: DC

## 2021-12-21 NOTE — Telephone Encounter (Signed)
Patient requesting refill of Zoloft. Please advise.  

## 2022-01-04 ENCOUNTER — Ambulatory Visit (INDEPENDENT_AMBULATORY_CARE_PROVIDER_SITE_OTHER): Payer: BC Managed Care – PPO | Admitting: Nurse Practitioner

## 2022-01-04 ENCOUNTER — Encounter: Payer: Self-pay | Admitting: Nurse Practitioner

## 2022-01-04 VITALS — BP 117/81 | HR 83 | Ht 63.0 in | Wt 159.1 lb

## 2022-01-04 DIAGNOSIS — L247 Irritant contact dermatitis due to plants, except food: Secondary | ICD-10-CM | POA: Diagnosis not present

## 2022-01-04 DIAGNOSIS — R21 Rash and other nonspecific skin eruption: Secondary | ICD-10-CM | POA: Insufficient documentation

## 2022-01-04 MED ORDER — HYDROXYZINE HCL 10 MG PO TABS: 10.0000 mg | ORAL_TABLET | Freq: Three times a day (TID) | ORAL | 0 refills | Status: DC | PRN

## 2022-01-04 MED ORDER — TRIAMCINOLONE ACETONIDE 0.5 % EX CREA: 1.0000 | TOPICAL_CREAM | Freq: Two times a day (BID) | CUTANEOUS | 0 refills | Status: DC

## 2022-01-04 MED ORDER — METHYLPREDNISOLONE 4 MG PO TBPK: ORAL_TABLET | ORAL | 0 refills | Status: DC

## 2022-01-04 NOTE — Progress Notes (Signed)
Established patient visit   Patient: Connie Cole   DOB: July 13, 1970   51 y.o. Female  MRN: [id number] Visit Date: 01/04/2022  Chief Complaint  Patient presents with   Poison Ivy   Subjective    HPI  The patient was working out in her yard about 2 weeks ago. Got poison ivy rash on right forearm, left upper arm, and left wrist. Has been using Lye soap to help calm the spread of the oil. Has used alcohol, calming lotion, and nothing has worked. She hsa noted blistering, especially on inner aspect of right forearm. Skin inflamed and erythematous. Gradually worsening.   Medications: Outpatient Medications Prior to Visit  Medication Sig   KRILL OIL PO Take 1 tablet by mouth daily.   MAGNESIUM PO Take 1 tablet by mouth daily. Potassium   methocarbamol (ROBAXIN) 500 MG tablet Take 1 tablet (500 mg total) by mouth 4 (four) times daily.   OVER THE COUNTER MEDICATION Place 1 patch onto the skin daily. Lifewave X39 stem cell Patch   OVER THE COUNTER MEDICATION Take 1 tablet by mouth daily. Instaflex joint support   oxyCODONE (OXY IR/ROXICODONE) 5 MG immediate release tablet Take 1 tablet (5 mg total) by mouth every 4 (four) hours as needed for moderate pain.   sertraline (ZOLOFT) 100 MG tablet Take 1 tablet (100 mg total) by mouth daily.   mupirocin ointment (BACTROBAN) 2 % Place 1 application into the nose 2 (two) times daily. (Patient not taking: Reported on 09/29/2021)   naltrexone (DEPADE) 50 MG tablet Take 1 tablet (50 mg total) by mouth daily. (Patient not taking: Reported on 09/29/2021)   No facility-administered medications prior to visit.    Review of Systems  Constitutional:  Negative for activity change, appetite change, chills, fatigue and fever.  HENT:  Negative for congestion, postnasal drip, rhinorrhea, sinus pressure, sinus pain, sneezing and sore throat.   Eyes: Negative.   Respiratory:  Negative for cough, chest tightness, shortness of breath and wheezing.   Cardiovascular:   Negative for chest pain and palpitations.  Gastrointestinal:  Negative for abdominal pain, constipation, diarrhea, nausea and vomiting.  Endocrine: Negative for cold intolerance, heat intolerance, polydipsia and polyuria.  Genitourinary:  Negative for dyspareunia, dysuria, flank pain, frequency and urgency.  Musculoskeletal:  Negative for arthralgias, back pain and myalgias.  Skin:  Positive for rash.       Poison ivy in inner aspect of right forearm, left wrist, left upper arm, and left temple. Skin very itchy irritated.   Allergic/Immunologic: Negative for environmental allergies.  Neurological:  Negative for dizziness, weakness and headaches.  Hematological:  Negative for adenopathy.  Psychiatric/Behavioral:  The patient is nervous/anxious.      Objective     Today's Vitals   01/04/22 1513  BP: 117/81  Pulse: 83  SpO2: 98%  Weight: 159 lb 1.9 oz (72.2 kg)   Body mass index is 28.19 kg/m.   Physical Exam Vitals and nursing note reviewed.  Constitutional:      Appearance: Normal appearance. She is well-developed.  HENT:     Head: Normocephalic and atraumatic.  Eyes:     Pupils: Pupils are equal, round, and reactive to light.  Cardiovascular:     Rate and Rhythm: Normal rate and regular rhythm.     Pulses: Normal pulses.     Heart sounds: Normal heart sounds.  Pulmonary:     Effort: Pulmonary effort is normal.     Breath sounds: Normal breath sounds.  Abdominal:  Palpations: Abdomen is soft.  Musculoskeletal:        General: Normal range of motion.     Cervical back: Normal range of motion and neck supple.  Lymphadenopathy:     Cervical: No cervical adenopathy.  Skin:    General: Skin is warm and dry.     Capillary Refill: Capillary refill takes less than 2 seconds.     Comments: Blistered rash on inner right forearm. Red and irritated. Small pustule on left lateral wrist and pustule on left shoulder. Skin very red and erythematous. No drainage currently noted  from any of lesions. No evidence of infection or cellulitis.   Neurological:     General: No focal deficit present.     Mental Status: She is alert and oriented to person, place, and time.  Psychiatric:        Mood and Affect: Mood normal.        Behavior: Behavior normal.        Thought Content: Thought content normal.        Judgment: Judgment normal.       Assessment & Plan     1. Contact dermatitis and eczema due to plant Treat with medrol taper. Take as directed for 6 days. Take hydroxyzine 10 mg up to three times daily as needed for acute itching. Advised patient to take this only when at home as this may cause her dizziness or drowsiness. May apply triamcinolone cream up to twice daily as needed for itching and irritation.  - methylPREDNISolone (MEDROL) 4 MG TBPK tablet; Take by mouth as directed for 6 days  Dispense: 21 tablet; Refill: 0 - triamcinolone cream (KENALOG) 0.5 %; Apply 1 Application topically 2 (two) times daily.  Dispense: 454 g; Refill: 0 - hydrOXYzine (ATARAX) 10 MG tablet; Take 1 tablet (10 mg total) by mouth 3 (three) times daily as needed for itching.  Dispense: 30 tablet; Refill: 0  2. Rash and nonspecific skin eruption Treat with medrol taper. Take as directed for 6 days. Take hydroxyzine 10 mg up to three times daily as needed for acute itching. Advised patient to take this only when at home as this may cause her dizziness or drowsiness. May apply triamcinolone cream up to twice daily as needed for itching and irritation.  - methylPREDNISolone (MEDROL) 4 MG TBPK tablet; Take by mouth as directed for 6 days  Dispense: 21 tablet; Refill: 0 - triamcinolone cream (KENALOG) 0.5 %; Apply 1 Application topically 2 (two) times daily.  Dispense: 454 g; Refill: 0 - hydrOXYzine (ATARAX) 10 MG tablet; Take 1 tablet (10 mg total) by mouth 3 (three) times daily as needed for itching.  Dispense: 30 tablet; Refill: 0   Return for prn worsening or persistent symptoms.         Ronnell Freshwater, NP  [facility] at [facility] [phone] (phone) [phone] (fax)  [place]

## 2022-01-10 ENCOUNTER — Telehealth: Payer: Self-pay | Admitting: Nurse Practitioner

## 2022-01-10 NOTE — Telephone Encounter (Signed)
Ok thanks 

## 2022-01-10 NOTE — Telephone Encounter (Signed)
LVM for patient to call back to schedule appt for further med refills. AMUCK

## 2022-01-10 NOTE — Telephone Encounter (Signed)
Patient called and stated she needed a refill on medication listed below. AMUCK  sertraline (ZOLOFT) 100 MG tablet   [address] - [place], [place] Phone:  [phone]  Fax:  [phone]

## 2022-01-11 ENCOUNTER — Other Ambulatory Visit: Payer: Self-pay

## 2022-01-11 DIAGNOSIS — F411 Generalized anxiety disorder: Secondary | ICD-10-CM

## 2022-01-11 MED ORDER — SERTRALINE HCL 100 MG PO TABS: 100.0000 mg | ORAL_TABLET | Freq: Every day | ORAL | 0 refills | Status: DC

## 2022-01-11 NOTE — Telephone Encounter (Signed)
Pt has appt for 01/12/2022

## 2022-01-12 ENCOUNTER — Ambulatory Visit (INDEPENDENT_AMBULATORY_CARE_PROVIDER_SITE_OTHER): Payer: BC Managed Care – PPO | Admitting: Nurse Practitioner

## 2022-01-12 ENCOUNTER — Telehealth: Payer: Self-pay | Admitting: Nurse Practitioner

## 2022-01-12 ENCOUNTER — Encounter: Payer: Self-pay | Admitting: Nurse Practitioner

## 2022-01-12 VITALS — BP 127/88 | HR 88 | Ht 63.0 in | Wt 160.0 lb

## 2022-01-12 DIAGNOSIS — L247 Irritant contact dermatitis due to plants, except food: Secondary | ICD-10-CM | POA: Diagnosis not present

## 2022-01-12 DIAGNOSIS — F1020 Alcohol dependence, uncomplicated: Secondary | ICD-10-CM

## 2022-01-12 DIAGNOSIS — F411 Generalized anxiety disorder: Secondary | ICD-10-CM | POA: Diagnosis not present

## 2022-01-12 MED ORDER — SERTRALINE HCL 100 MG PO TABS: 150.0000 mg | ORAL_TABLET | Freq: Every day | ORAL | 1 refills | Status: DC

## 2022-01-12 MED ORDER — SERTRALINE HCL 100 MG PO TABS: 150.0000 mg | ORAL_TABLET | Freq: Every day | ORAL | 0 refills | Status: DC

## 2022-01-12 NOTE — Progress Notes (Signed)
Established patient visit   Patient: Connie Cole   DOB: May 31, 1971   51 y.o. Female  MRN: [id number] Visit Date: 01/12/2022   Chief Complaint  Patient presents with   Follow-up   Subjective    HPI  Follow up GAD and depression  -taking 150 mg zoloft everyday  -does admit to heavy drinking in the evenings.  --has intermittently tried naltraexone to help her decrease her alcohol intake. Currently cutting back her alcohol intake on her own.    Medications: Outpatient Medications Prior to Visit  Medication Sig   hydrOXYzine (ATARAX) 10 MG tablet Take 1 tablet (10 mg total) by mouth 3 (three) times daily as needed for itching.   KRILL OIL PO Take 1 tablet by mouth daily.   MAGNESIUM PO Take 1 tablet by mouth daily. Potassium   methocarbamol (ROBAXIN) 500 MG tablet Take 1 tablet (500 mg total) by mouth 4 (four) times daily.   methylPREDNISolone (MEDROL) 4 MG TBPK tablet Take by mouth as directed for 6 days   OVER THE COUNTER MEDICATION Place 1 patch onto the skin daily. Lifewave X39 stem cell Patch   OVER THE COUNTER MEDICATION Take 1 tablet by mouth daily. Instaflex joint support   oxyCODONE (OXY IR/ROXICODONE) 5 MG immediate release tablet Take 1 tablet (5 mg total) by mouth every 4 (four) hours as needed for moderate pain.   triamcinolone cream (KENALOG) 0.5 % Apply 1 Application topically 2 (two) times daily.   mupirocin ointment (BACTROBAN) 2 % Place 1 application into the nose 2 (two) times daily. (Patient not taking: Reported on 09/29/2021)   naltrexone (DEPADE) 50 MG tablet Take 1 tablet (50 mg total) by mouth daily. (Patient not taking: Reported on 09/29/2021)   [DISCONTINUED] sertraline (ZOLOFT) 100 MG tablet Take 1 tablet (100 mg total) by mouth daily.   No facility-administered medications prior to visit.    Review of Systems  Constitutional:  Negative for activity change, appetite change, chills, fatigue and fever.  HENT:  Negative for congestion, postnasal drip,  rhinorrhea, sinus pressure, sinus pain, sneezing and sore throat.   Eyes: Negative.   Respiratory:  Negative for cough, chest tightness, shortness of breath and wheezing.   Cardiovascular:  Negative for chest pain and palpitations.  Gastrointestinal:  Negative for abdominal pain, constipation, diarrhea, nausea and vomiting.  Endocrine: Negative for cold intolerance, heat intolerance, polydipsia and polyuria.  Genitourinary:  Negative for dyspareunia, dysuria, flank pain, frequency and urgency.  Musculoskeletal:  Negative for arthralgias, back pain and myalgias.  Skin:  Negative for rash.  Allergic/Immunologic: Negative for environmental allergies.  Neurological:  Negative for dizziness, weakness and headaches.  Hematological:  Negative for adenopathy.  Psychiatric/Behavioral:  Positive for dysphoric mood. The patient is nervous/anxious.        Objective     Today's Vitals   01/12/22 1608  BP: 127/88  Pulse: 88  SpO2: 97%  Weight: 160 lb (72.6 kg)   Body mass index is 28.34 kg/m.   BP Readings from Last 3 Encounters:  01/12/22 127/88  01/04/22 117/81  10/11/21 97/66    Wt Readings from Last 3 Encounters:  01/12/22 160 lb (72.6 kg)  01/04/22 159 lb 1.9 oz (72.2 kg)  10/11/21 158 lb 1.1 oz (71.7 kg)    Physical Exam Vitals and nursing note reviewed.  Constitutional:      Appearance: Normal appearance. She is well-developed.  HENT:     Head: Normocephalic and atraumatic.     Nose: Nose normal.  Mouth/Throat:     Mouth: Mucous membranes are moist.     Pharynx: Oropharynx is clear.  Eyes:     Extraocular Movements: Extraocular movements intact.     Conjunctiva/sclera: Conjunctivae normal.     Pupils: Pupils are equal, round, and reactive to light.  Cardiovascular:     Rate and Rhythm: Normal rate and regular rhythm.     Pulses: Normal pulses.     Heart sounds: Normal heart sounds.  Pulmonary:     Effort: Pulmonary effort is normal.     Breath sounds: Normal  breath sounds.  Abdominal:     Palpations: Abdomen is soft.  Musculoskeletal:        General: Normal range of motion.     Cervical back: Normal range of motion and neck supple.  Lymphadenopathy:     Cervical: No cervical adenopathy.  Skin:    General: Skin is warm and dry.     Capillary Refill: Capillary refill takes less than 2 seconds.  Neurological:     General: No focal deficit present.     Mental Status: She is alert and oriented to person, place, and time.  Psychiatric:        Mood and Affect: Mood normal.        Behavior: Behavior normal.        Thought Content: Thought content normal.        Judgment: Judgment normal.      Assessment & Plan    1. GAD (generalized anxiety disorder) Stable. Patient taking zoloft 150 mg  daily. Continue as prescribed. Refills sent to her pharmacy  - sertraline (ZOLOFT) 100 MG tablet; Take 1.5 tablets (150 mg total) by mouth daily.  Dispense: 135 tablet; Refill: 1  2. Alcohol use disorder, moderate, dependence ([place]) Patient currently cutting back alcohol intake on her own. Will discuss further treatment with naltrexone as indicated.   3. Contact dermatitis and eczema due to plant Resolved.     Problem List Items Addressed This Visit       Musculoskeletal and Integument   Contact dermatitis and eczema due to plant     Other   GAD (generalized anxiety disorder) - Primary   Relevant Medications   sertraline (ZOLOFT) 100 MG tablet   Alcohol use disorder, moderate, dependence ([place])     Return in about 6 months (around 07/15/2022) for health maintenance exam. please get records from GYN and GI - sees Dr. Collene Mares. Marland Kitchen         Ronnell Freshwater, NP  [facility] at [facility] [phone] (phone) [phone] (fax)  [place]

## 2022-01-26 DIAGNOSIS — R1032 Left lower quadrant pain: Secondary | ICD-10-CM | POA: Diagnosis not present

## 2022-02-16 DIAGNOSIS — Z90711 Acquired absence of uterus with remaining cervical stump: Secondary | ICD-10-CM | POA: Diagnosis not present

## 2022-02-16 DIAGNOSIS — R1032 Left lower quadrant pain: Secondary | ICD-10-CM | POA: Diagnosis not present

## 2022-03-24 ENCOUNTER — Ambulatory Visit (INDEPENDENT_AMBULATORY_CARE_PROVIDER_SITE_OTHER): Payer: BC Managed Care – PPO | Admitting: Nurse Practitioner

## 2022-03-24 ENCOUNTER — Encounter: Payer: Self-pay | Admitting: Nurse Practitioner

## 2022-03-24 VITALS — BP 114/76 | HR 88 | Ht 63.0 in | Wt 162.1 lb

## 2022-03-24 DIAGNOSIS — B009 Herpesviral infection, unspecified: Secondary | ICD-10-CM | POA: Insufficient documentation

## 2022-03-24 MED ORDER — ACYCLOVIR 5 % EX OINT: TOPICAL_OINTMENT | CUTANEOUS | 1 refills | Status: DC

## 2022-03-24 MED ORDER — VALACYCLOVIR HCL 1 G PO TABS: 1000.0000 mg | ORAL_TABLET | Freq: Two times a day (BID) | ORAL | 2 refills | Status: DC

## 2022-03-24 NOTE — Progress Notes (Signed)
Established patient visit   Patient: Connie Cole   DOB: 24-Oct-1970   51 y.o. Female  MRN: [id number] Visit Date: 03/24/2022  Chief Complaint  Patient presents with   Herpes Zoster   Subjective    HPI  Acute visit.  -new herpes lesion in the perineal area.  -has sore, itching, pain.  -husband has known HSV 2. Has had for well over 10 years.  -gradually improving.   Medications: Outpatient Medications Prior to Visit  Medication Sig   hydrOXYzine (ATARAX) 10 MG tablet Take 1 tablet (10 mg total) by mouth 3 (three) times daily as needed for itching.   KRILL OIL PO Take 1 tablet by mouth daily.   MAGNESIUM PO Take 1 tablet by mouth daily. Potassium   methocarbamol (ROBAXIN) 500 MG tablet Take 1 tablet (500 mg total) by mouth 4 (four) times daily.   methylPREDNISolone (MEDROL) 4 MG TBPK tablet Take by mouth as directed for 6 days   OVER THE COUNTER MEDICATION Place 1 patch onto the skin daily. Lifewave X39 stem cell Patch   OVER THE COUNTER MEDICATION Take 1 tablet by mouth daily. Instaflex joint support   oxyCODONE (OXY IR/ROXICODONE) 5 MG immediate release tablet Take 1 tablet (5 mg total) by mouth every 4 (four) hours as needed for moderate pain.   sertraline (ZOLOFT) 100 MG tablet Take 1.5 tablets (150 mg total) by mouth daily.   triamcinolone cream (KENALOG) 0.5 % Apply 1 Application topically 2 (two) times daily.   [DISCONTINUED] mupirocin ointment (BACTROBAN) 2 % Place 1 application into the nose 2 (two) times daily. (Patient not taking: Reported on 09/29/2021)   [DISCONTINUED] naltrexone (DEPADE) 50 MG tablet Take 1 tablet (50 mg total) by mouth daily. (Patient not taking: Reported on 09/29/2021)   No facility-administered medications prior to visit.    Review of Systems  Genitourinary:  Positive for genital sores.       Herpes lesion in perianal area, itchy and tender.   All other systems reviewed and are negative.    Objective     Today's Vitals   03/24/22 1401  BP:  114/76  Pulse: 88  SpO2: 97%  Weight: 162 lb 1.9 oz (73.5 kg)  Height: '5\' 3"'$  (1.6 m)   Body mass index is 28.72 kg/m.   Physical Exam Vitals and nursing note reviewed.  Constitutional:      Appearance: Normal appearance. She is well-developed.  HENT:     Head: Normocephalic and atraumatic.     Nose: Nose normal.     Mouth/Throat:     Mouth: Mucous membranes are moist.     Pharynx: Oropharynx is clear.  Eyes:     Extraocular Movements: Extraocular movements intact.     Conjunctiva/sclera: Conjunctivae normal.     Pupils: Pupils are equal, round, and reactive to light.  Cardiovascular:     Rate and Rhythm: Normal rate and regular rhythm.     Pulses: Normal pulses.     Heart sounds: Normal heart sounds.  Pulmonary:     Effort: Pulmonary effort is normal.     Breath sounds: Normal breath sounds.  Abdominal:     Palpations: Abdomen is soft.  Musculoskeletal:        General: Normal range of motion.     Cervical back: Normal range of motion and neck supple.  Lymphadenopathy:     Cervical: No cervical adenopathy.  Skin:    General: Skin is warm and dry.     Capillary Refill: Capillary refill  takes less than 2 seconds.  Neurological:     General: No focal deficit present.     Mental Status: She is alert and oriented to person, place, and time.  Psychiatric:        Mood and Affect: Mood normal.        Behavior: Behavior normal.        Thought Content: Thought content normal.        Judgment: Judgment normal.       Assessment & Plan     1. Herpes simplex virus (HSV) infection Treat with valacyclovir 1000 mg twice daily for 5 days. Apply acyclovir ointment to effected area up to 4 times daily as needed. Discussed repeating treatment as needed. Consider suppressive therapy if needed.  - valACYclovir (VALTREX) 1000 MG tablet; Take 1 tablet (1,000 mg total) by mouth 2 (two) times daily.  Dispense: 10 tablet; Refill: 2 - acyclovir ointment (ZOVIRAX) 5 %; Apply to effected  areas up to 4 times daily as needed  Dispense: 30 g; Refill: 1   Return for prn worsening or persistent symptoms.        Ronnell Freshwater, NP  [facility] at [facility] [phone] (phone) [phone] (fax)  [place]

## 2022-03-30 ENCOUNTER — Telehealth: Payer: Self-pay

## 2022-03-30 ENCOUNTER — Other Ambulatory Visit: Payer: Self-pay | Admitting: Nurse Practitioner

## 2022-03-30 DIAGNOSIS — J302 Other seasonal allergic rhinitis: Secondary | ICD-10-CM

## 2022-03-30 DIAGNOSIS — H1013 Acute atopic conjunctivitis, bilateral: Secondary | ICD-10-CM

## 2022-03-30 MED ORDER — AZELASTINE HCL 0.05 % OP SOLN: 1.0000 [drp] | Freq: Two times a day (BID) | OPHTHALMIC | 2 refills | Status: DC

## 2022-03-30 MED ORDER — DESLORATADINE 5 MG PO TABS: 5.0000 mg | ORAL_TABLET | Freq: Every day | ORAL | 2 refills | Status: DC

## 2022-03-30 NOTE — Telephone Encounter (Signed)
Please let the patient know that I sent prescription for clarinex 5 mg daily. I also added azelestine eye drops. She should put one drop in both eyes twice daily as needed. Both were sent to [facility] .  Thanks  -HB

## 2022-03-30 NOTE — Telephone Encounter (Signed)
Patient came into office to see if she could get something for allergies, patients right eye is irritating her and she has tried all allergy medication over the counter, please advise, thanks!

## 2022-04-07 ENCOUNTER — Other Ambulatory Visit: Payer: Self-pay | Admitting: Obstetrics and Gynecology

## 2022-04-07 ENCOUNTER — Telehealth: Payer: Self-pay

## 2022-04-07 DIAGNOSIS — R109 Unspecified abdominal pain: Secondary | ICD-10-CM

## 2022-04-07 NOTE — Telephone Encounter (Signed)
Patient called office requesting refill on VALTREX, patient states that it was trial run but she would like to get on the regular, please advise, thanks!

## 2022-04-10 NOTE — Telephone Encounter (Signed)
Please let her know that I had put refills on the initial prescription.  Has she used those?

## 2022-04-11 ENCOUNTER — Other Ambulatory Visit: Payer: Self-pay | Admitting: Nurse Practitioner

## 2022-04-11 DIAGNOSIS — B009 Herpesviral infection, unspecified: Secondary | ICD-10-CM

## 2022-04-11 MED ORDER — VALACYCLOVIR HCL 1 G PO TABS: 1000.0000 mg | ORAL_TABLET | Freq: Every day | ORAL | 5 refills | Status: DC

## 2022-04-11 NOTE — Telephone Encounter (Signed)
Please let her know that I changed valacyclovir to 1000 mg daily and sent this  along with refills to [facility].  Thanks  -HB

## 2022-04-11 NOTE — Telephone Encounter (Signed)
Called pt she is advised of her Rx that was sent to pharmacy

## 2022-04-11 NOTE — Telephone Encounter (Signed)
Called pt LVM to contact the office °

## 2022-04-11 NOTE — Telephone Encounter (Signed)
Called pt she stated that she is on her last refill and that she would like to have it as a Rx

## 2022-04-13 ENCOUNTER — Ambulatory Visit (INDEPENDENT_AMBULATORY_CARE_PROVIDER_SITE_OTHER): Payer: BC Managed Care – PPO | Admitting: Nurse Practitioner

## 2022-04-13 ENCOUNTER — Encounter: Payer: Self-pay | Admitting: Nurse Practitioner

## 2022-04-13 VITALS — BP 114/80 | HR 81 | Ht 63.0 in | Wt 162.0 lb

## 2022-04-13 DIAGNOSIS — J0140 Acute pansinusitis, unspecified: Secondary | ICD-10-CM | POA: Diagnosis not present

## 2022-04-13 DIAGNOSIS — J029 Acute pharyngitis, unspecified: Secondary | ICD-10-CM | POA: Diagnosis not present

## 2022-04-13 DIAGNOSIS — R6889 Other general symptoms and signs: Secondary | ICD-10-CM

## 2022-04-13 LAB — POCT INFLUENZA A/B
Influenza A, POC: NEGATIVE
Influenza B, POC: NEGATIVE

## 2022-04-13 LAB — POCT RAPID STREP A (OFFICE): Rapid Strep A Screen: NEGATIVE

## 2022-04-13 MED ORDER — AZITHROMYCIN 250 MG PO TABS: ORAL_TABLET | ORAL | 0 refills | Status: DC

## 2022-04-13 NOTE — Progress Notes (Signed)
Established patient visit   Patient: Connie Cole   DOB: 1970-10-30   51 y.o. Female  MRN: [id number] Visit Date: 04/13/2022  Chief Complaint  Patient presents with   Nasal Congestion   Subjective    Sinusitis This is a recurrent problem. The current episode started in the past 7 days. The problem has been waxing and waning since onset. Associated symptoms include congestion, ear pain, headaches, a hoarse voice, neck pain, sinus pressure, a sore throat and swollen glands. (Has had some body aches ) Past treatments include oral decongestants, nasal decongestants and acetaminophen. The treatment provided mild relief.      Medications: Outpatient Medications Prior to Visit  Medication Sig   acyclovir ointment (ZOVIRAX) 5 % Apply to effected areas up to 4 times daily as needed   azelastine (OPTIVAR) 0.05 % ophthalmic solution Place 1 drop into both eyes 2 (two) times daily.   desloratadine (CLARINEX) 5 MG tablet Take 1 tablet (5 mg total) by mouth daily.   hydrOXYzine (ATARAX) 10 MG tablet Take 1 tablet (10 mg total) by mouth 3 (three) times daily as needed for itching.   KRILL OIL PO Take 1 tablet by mouth daily.   MAGNESIUM PO Take 1 tablet by mouth daily. Potassium   methocarbamol (ROBAXIN) 500 MG tablet Take 1 tablet (500 mg total) by mouth 4 (four) times daily.   methylPREDNISolone (MEDROL) 4 MG TBPK tablet Take by mouth as directed for 6 days   OVER THE COUNTER MEDICATION Place 1 patch onto the skin daily. Lifewave X39 stem cell Patch   OVER THE COUNTER MEDICATION Take 1 tablet by mouth daily. Instaflex joint support   oxyCODONE (OXY IR/ROXICODONE) 5 MG immediate release tablet Take 1 tablet (5 mg total) by mouth every 4 (four) hours as needed for moderate pain.   sertraline (ZOLOFT) 100 MG tablet Take 1.5 tablets (150 mg total) by mouth daily.   triamcinolone cream (KENALOG) 0.5 % Apply 1 Application topically 2 (two) times daily.   valACYclovir (VALTREX) 1000 MG tablet Take 1  tablet (1,000 mg total) by mouth daily.   No facility-administered medications prior to visit.    Review of Systems  HENT:  Positive for congestion, ear pain, hoarse voice, sinus pressure and sore throat.   Musculoskeletal:  Positive for neck pain.  Neurological:  Positive for headaches.     Objective     Today's Vitals   04/13/22 1621  BP: 114/80  Pulse: 81  SpO2: 97%  Weight: 162 lb (73.5 kg)  Height: '5\' 3"'$  (1.6 m)   Body mass index is 28.7 kg/m.   Physical Exam Vitals and nursing note reviewed.  Constitutional:      Appearance: Normal appearance. She is well-developed. She is ill-appearing.  HENT:     Head: Normocephalic and atraumatic.     Right Ear: Tympanic membrane is erythematous and bulging.     Left Ear: Tympanic membrane is erythematous and bulging.     Nose: Congestion and rhinorrhea present.     Right Sinus: Maxillary sinus tenderness and frontal sinus tenderness present.     Left Sinus: Maxillary sinus tenderness and frontal sinus tenderness present.     Mouth/Throat:     Pharynx: Pharyngeal swelling and posterior oropharyngeal erythema present.  Eyes:     Pupils: Pupils are equal, round, and reactive to light.  Cardiovascular:     Rate and Rhythm: Normal rate and regular rhythm.     Pulses: Normal pulses.     Heart sounds:  Normal heart sounds.  Pulmonary:     Effort: Pulmonary effort is normal.     Breath sounds: Normal breath sounds.     Comments: Dry, non productive cough noted  Abdominal:     Palpations: Abdomen is soft.  Musculoskeletal:        General: Normal range of motion.     Cervical back: Normal range of motion and neck supple.  Lymphadenopathy:     Cervical: Cervical adenopathy present.  Skin:    General: Skin is warm and dry.     Capillary Refill: Capillary refill takes less than 2 seconds.  Neurological:     General: No focal deficit present.     Mental Status: She is alert and oriented to person, place, and time.  Psychiatric:         Mood and Affect: Mood normal.        Behavior: Behavior normal.        Thought Content: Thought content normal.        Judgment: Judgment normal.     Results for orders placed or performed in visit on 04/13/22  POCT rapid strep A  Result Value Ref Range   Rapid Strep A Screen Negative Negative  POCT Influenza A/B  Result Value Ref Range   Influenza A, POC Negative Negative   Influenza B, POC Negative Negative    Assessment & Plan     1. Sore throat Both rapid strep and flu A/B testing negative.  Likely related to sinusitis. - POCT rapid strep A - POCT Influenza A/B  2. Acute non-recurrent pansinusitis Start Z-Pak.  Take as directed for 5 days. Rest and increase fluids. Continue using OTC medication to control symptoms.   - azithromycin (ZITHROMAX) 250 MG tablet; z-pack - take as directed for 5 days  Dispense: 6 tablet; Refill: 0   Return for prn worsening or persistent symptoms.        Ronnell Freshwater, NP  [facility] at [facility] [phone] (phone) [phone] (fax)  [place]

## 2022-04-15 ENCOUNTER — Other Ambulatory Visit: Payer: Self-pay | Admitting: Obstetrics and Gynecology

## 2022-04-15 DIAGNOSIS — R109 Unspecified abdominal pain: Secondary | ICD-10-CM

## 2022-04-15 DIAGNOSIS — R350 Frequency of micturition: Secondary | ICD-10-CM | POA: Diagnosis not present

## 2022-05-01 DIAGNOSIS — J029 Acute pharyngitis, unspecified: Secondary | ICD-10-CM | POA: Insufficient documentation

## 2022-05-03 ENCOUNTER — Ambulatory Visit
Admission: RE | Admit: 2022-05-03 | Discharge: 2022-05-03 | Disposition: A | Discharge status: Home / Self Care | Payer: BC Managed Care – PPO | Source: Ambulatory Visit | Attending: Obstetrics and Gynecology | Admitting: Obstetrics and Gynecology

## 2022-05-03 DIAGNOSIS — D1771 Benign lipomatous neoplasm of kidney: Secondary | ICD-10-CM | POA: Diagnosis not present

## 2022-05-03 DIAGNOSIS — R109 Unspecified abdominal pain: Secondary | ICD-10-CM

## 2022-05-03 DIAGNOSIS — Z9049 Acquired absence of other specified parts of digestive tract: Secondary | ICD-10-CM | POA: Diagnosis not present

## 2022-05-03 DIAGNOSIS — R918 Other nonspecific abnormal finding of lung field: Secondary | ICD-10-CM | POA: Diagnosis not present

## 2022-05-03 DIAGNOSIS — K5900 Constipation, unspecified: Secondary | ICD-10-CM | POA: Diagnosis not present

## 2022-05-03 MED ORDER — IOPAMIDOL (ISOVUE-370) INJECTION 76%
80.0000 mL | Freq: Once | INTRAVENOUS | Status: AC | PRN
  Administered 2022-05-03: 80 mL via INTRAVENOUS

## 2022-05-23 ENCOUNTER — Other Ambulatory Visit: Payer: Self-pay | Admitting: Nurse Practitioner

## 2022-05-23 ENCOUNTER — Telehealth: Payer: Self-pay

## 2022-05-23 DIAGNOSIS — R21 Rash and other nonspecific skin eruption: Secondary | ICD-10-CM

## 2022-05-23 DIAGNOSIS — L247 Irritant contact dermatitis due to plants, except food: Secondary | ICD-10-CM

## 2022-05-23 DIAGNOSIS — M5440 Lumbago with sciatica, unspecified side: Secondary | ICD-10-CM

## 2022-05-23 MED ORDER — METHYLPREDNISOLONE 4 MG PO TBPK: ORAL_TABLET | ORAL | 0 refills | Status: DC

## 2022-05-23 MED ORDER — CYCLOBENZAPRINE HCL 10 MG PO TABS: 5.0000 mg | ORAL_TABLET | Freq: Every evening | ORAL | 1 refills | Status: DC | PRN

## 2022-05-23 NOTE — Telephone Encounter (Signed)
Please let the patient know that I sent a medrol taper. Take as directed for 6 days. I recommend starting this early in the day. I also added flexeril to take 1/2 to 1 tablet at bedtime as needed for muscle spasms. Both were sent to [facility].  Thanks so much.   -HB

## 2022-05-23 NOTE — Telephone Encounter (Signed)
Pt is requesting medication for severe sciatica pain via VM. Pt requested Prednisone and a muscle relaxer for night time use for pain. Pt has a friend that has tried this regimen and states that it relieve the pain.  Please advise

## 2022-05-24 NOTE — Telephone Encounter (Signed)
Called pt she is advised of her Rx that was sent to [facility]

## 2022-07-17 NOTE — Progress Notes (Deleted)
Complete physical exam   Patient: Connie Cole   DOB: [birth year]   52 y.o. Female  MRN: [id number] Visit Date: 07/18/2022    No chief complaint on file.  Subjective    Connie Cole is a 52 y.o. female who presents today for a complete physical exam.  She reports consuming a {diet types:17450} diet. {Exercise:19826} She generally feels {well/fairly well/poorly:18703}. She {does/does not:200015} have additional problems to discuss today.   HPI  Annual physical  -history of anxiety/depression -history of alcohol overuse.  -due for routine, fasting labs  -does have GYN provider.  --recently had hysterectomy.  -unsure if she had mammogram this year.    Past Medical History:  Diagnosis Date   Anemia    Anxiety    Arthritis    hands, hips   Depression    GERD (gastroesophageal reflux disease)    occasional diet controlled- no med   Palpitations    normal heart monitor x 1 wk in 2017   Pituitary tumor    Past Surgical History:  Procedure Laterality Date   COLONOSCOPY     polyp   DILITATION & CURRETTAGE/HYSTROSCOPY WITH HYDROTHERMAL ABLATION N/A 06/03/2016   Procedure: DILATATION & CURETTAGE/HYSTEROSCOPY WITH HYDROTHERMAL ABLATION;  Surgeon: Brien Few, MD;  Location: [facility];  Service: Gynecology;  Laterality: N/A;   IR RADIOLOGIST EVAL & MGMT  09/06/2016   RECTOPEXY N/A 10/08/2021   Procedure: RECTOPEXY;  Surgeon: Leighton Ruff, MD;  Location: [facility];  Service: General;  Laterality: N/A;   ROBOTIC ASSISTED LAPAROSCOPIC HYSTERECTOMY AND SALPINGECTOMY Bilateral 10/08/2021   Procedure: XI ROBOTIC ASSISTED LAPAROSCOPIC HYSTERECTOMY AND SALPINGECTOMY;  Surgeon: Brien Few, MD;  Location: [facility];  Service: Gynecology;  Laterality: Bilateral;   TONSILLECTOMY     TOTAL HIP ARTHROPLASTY Right 01/10/2019   Procedure: TOTAL HIP ARTHROPLASTY ANTERIOR APPROACH;  Surgeon: Paralee Cancel, MD;  Location: [facility];  Service: Orthopedics;  Laterality: Right;  70 mins   UMBILICAL HERNIA  REPAIR N/A 10/08/2021   Procedure: HERNIA REPAIR UMBILICAL ADULT;  Surgeon: Leighton Ruff, MD;  Location: [facility];  Service: General;  Laterality: N/A;   Social History   Socioeconomic History   Marital status: Married    Spouse name: Not on file   Number of children: Not on file   Years of education: Not on file   Highest education level: Not on file  Occupational History   Not on file  Tobacco Use   Smoking status: Former    Packs/day: 0.50    Years: 5.00    Total pack years: 2.50    Types: Cigarettes    Quit date: 06/27/2000    Years since quitting: 22.0   Smokeless tobacco: Never  Vaping Use   Vaping Use: Never used  Substance and Sexual Activity   Alcohol use: Yes    Alcohol/week: 2.0 - 3.0 standard drinks of alcohol    Types: 2 - 3 Glasses of wine per week    Comment: daily   Drug use: No   Sexual activity: Yes    Birth control/protection: None  Other Topics Concern   Not on file  Social History Narrative   ** Merged History Encounter **       Social Determinants of Health   Financial Resource Strain: Not on file  Food Insecurity: Not on file  Transportation Needs: Not on file  Physical Activity: Not on file  Stress: Not on file  Social Connections: Not on file  Intimate Partner Violence: Not on file  Family Status  Relation Name Status   Mother  Alive   Father  Alive   Sister  Alive   Family History  Problem Relation Age of Onset   Stroke Mother    Heart disease Mother    Hypertension Mother    Cancer Father        eye   Healthy Sister    Allergies  Allergen Reactions   Latex Swelling   Latex Rash    Patient Care Team: Ronnell Freshwater, NP as PCP - General (Family Medicine) Brien Few, MD as Consulting Physician (Obstetrics and Gynecology) Merrilee Seashore, MD as Consulting Physician (Internal Medicine) Obgyn, Rosanne Gutting, MD as Consulting Physician (Gastroenterology)   Medications: Outpatient Medications Prior to  Visit  Medication Sig   acyclovir ointment (ZOVIRAX) 5 % Apply to effected areas up to 4 times daily as needed   azelastine (OPTIVAR) 0.05 % ophthalmic solution Place 1 drop into both eyes 2 (two) times daily.   azithromycin (ZITHROMAX) 250 MG tablet z-pack - take as directed for 5 days   cyclobenzaprine (FLEXERIL) 10 MG tablet Take 0.5-1 tablets (5-10 mg total) by mouth at bedtime as needed for muscle spasms.   desloratadine (CLARINEX) 5 MG tablet Take 1 tablet (5 mg total) by mouth daily.   hydrOXYzine (ATARAX) 10 MG tablet Take 1 tablet (10 mg total) by mouth 3 (three) times daily as needed for itching.   KRILL OIL PO Take 1 tablet by mouth daily.   MAGNESIUM PO Take 1 tablet by mouth daily. Potassium   methocarbamol (ROBAXIN) 500 MG tablet Take 1 tablet (500 mg total) by mouth 4 (four) times daily.   methylPREDNISolone (MEDROL) 4 MG TBPK tablet Take by mouth as directed for 6 days   OVER THE COUNTER MEDICATION Place 1 patch onto the skin daily. Lifewave X39 stem cell Patch   OVER THE COUNTER MEDICATION Take 1 tablet by mouth daily. Instaflex joint support   oxyCODONE (OXY IR/ROXICODONE) 5 MG immediate release tablet Take 1 tablet (5 mg total) by mouth every 4 (four) hours as needed for moderate pain.   sertraline (ZOLOFT) 100 MG tablet Take 1.5 tablets (150 mg total) by mouth daily.   triamcinolone cream (KENALOG) 0.5 % Apply 1 Application topically 2 (two) times daily.   valACYclovir (VALTREX) 1000 MG tablet Take 1 tablet (1,000 mg total) by mouth daily.   No facility-administered medications prior to visit.    Review of Systems  {Labs (Optional):23779}   Objective    There were no vitals filed for this visit. There is no height or weight on file to calculate BMI.  BP Readings from Last 3 Encounters:  04/13/22 114/80  03/24/22 114/76  01/12/22 127/88    Wt Readings from Last 3 Encounters:  04/13/22 162 lb (73.5 kg)  03/24/22 162 lb 1.9 oz (73.5 kg)  01/12/22 160 lb (72.6  kg)     Physical Exam  ***  Last depression screening scores   Row Labels 04/13/2022    4:22 PM 03/24/2022    2:04 PM 01/04/2022    3:16 PM  PHQ 2/9 Scores   Section Header. No data exists in this row.     PHQ - 2 Score   0 0 0  PHQ- 9 Score   0 0 0   Last fall risk screening   Row Labels 07/19/2021    4:00 PM  Fall Risk    Section Header. No data exists in this row.   Falls  in the past year?   0  Number falls in past yr:   0  Injury with Fall?   0  Follow up   Falls evaluation completed   Last Audit-C alcohol use screening   No data to display    A score of 3 or more in women, and 4 or more in men indicates increased risk for alcohol abuse, EXCEPT if all of the points are from question 1   No results found for any visits on 07/18/22.  Assessment & Plan    Routine Health Maintenance and Physical Exam  Exercise Activities and Dietary recommendations  Goals   None      There is no immunization history on file for this patient.  Health Maintenance  Topic Date Due   COVID-19 Vaccine (1) Never done   DTaP/Tdap/Td (1 - Tdap) Never done   COLONOSCOPY (Pts 45-8yr Insurance coverage will need to be confirmed)  Never done   MAMMOGRAM  01/10/2019   INFLUENZA VACCINE  Never done   Hepatitis C Screening  08/10/2022 (Originally 12/24/1988)   HIV Screening  08/10/2022 (Originally 12/24/1985)   Zoster Vaccines- Shingrix (1 of 2) 03/28/2031 (Originally 12/24/2020)   PAP SMEAR-Modifier  10/14/2022   HPV VACCINES  Aged Out    Discussed health benefits of physical activity, and encouraged her to engage in regular exercise appropriate for her age and condition.  Problem List Items Addressed This Visit   None    No follow-ups on file.        HRonnell Freshwater NP  [phone](phone) [phone](fax)  [place]

## 2022-07-18 ENCOUNTER — Encounter: Payer: BC Managed Care – PPO | Admitting: Nurse Practitioner

## 2022-08-01 ENCOUNTER — Telehealth: Payer: Self-pay

## 2022-08-01 NOTE — Telephone Encounter (Signed)
Pt is calling requesting a call back from MA.   Pt is wondering if weight loss injections but not for weight lost purposes but wanting to see if it would help with Addiction with alcohol.  Pt states she is aware that she will need an appointment but prior to scheduling an appointment she wants to know if that is something that Nira Conn will even consider prescribing if not she isn't interested in scheduling an appointment.   Please advise

## 2022-08-01 NOTE — Telephone Encounter (Signed)
Called pt she is advised weight loss injection is not a way for alcohol addition

## 2022-08-24 ENCOUNTER — Other Ambulatory Visit: Payer: Self-pay | Admitting: Nurse Practitioner

## 2022-08-24 DIAGNOSIS — F411 Generalized anxiety disorder: Secondary | ICD-10-CM

## 2022-08-26 ENCOUNTER — Other Ambulatory Visit: Payer: Self-pay | Admitting: Nurse Practitioner

## 2022-08-26 DIAGNOSIS — F411 Generalized anxiety disorder: Secondary | ICD-10-CM

## 2022-08-29 ENCOUNTER — Other Ambulatory Visit: Payer: Self-pay

## 2022-08-29 ENCOUNTER — Telehealth: Payer: Self-pay

## 2022-08-29 DIAGNOSIS — F411 Generalized anxiety disorder: Secondary | ICD-10-CM

## 2022-08-29 MED ORDER — SERTRALINE HCL 100 MG PO TABS: 150.0000 mg | ORAL_TABLET | Freq: Every day | ORAL | 1 refills | Status: DC

## 2022-08-29 NOTE — Telephone Encounter (Signed)
Rx was sent to [facility]

## 2022-08-29 NOTE — Telephone Encounter (Signed)
Pt is requesting a refill on: sertraline (ZOLOFT) 100 MG tablet    Pharmacy: [facility], [place] 01/12/22 ROV 09/28/22

## 2022-09-06 ENCOUNTER — Other Ambulatory Visit: Payer: Self-pay | Admitting: Orthopedic Surgery

## 2022-09-06 DIAGNOSIS — S72109G Unspecified trochanteric fracture of unspecified femur, subsequent encounter for closed fracture with delayed healing: Secondary | ICD-10-CM

## 2022-09-28 ENCOUNTER — Encounter: Payer: Self-pay | Admitting: Nurse Practitioner

## 2022-09-28 ENCOUNTER — Ambulatory Visit (INDEPENDENT_AMBULATORY_CARE_PROVIDER_SITE_OTHER): Payer: 59 | Admitting: Nurse Practitioner

## 2022-09-28 VITALS — BP 110/77 | HR 89 | Ht 63.0 in | Wt 165.0 lb

## 2022-09-28 DIAGNOSIS — F411 Generalized anxiety disorder: Secondary | ICD-10-CM | POA: Diagnosis not present

## 2022-09-28 DIAGNOSIS — F1020 Alcohol dependence, uncomplicated: Secondary | ICD-10-CM | POA: Diagnosis not present

## 2022-09-28 DIAGNOSIS — M25551 Pain in right hip: Secondary | ICD-10-CM | POA: Diagnosis not present

## 2022-09-28 DIAGNOSIS — D485 Neoplasm of uncertain behavior of skin: Secondary | ICD-10-CM | POA: Diagnosis not present

## 2022-09-28 MED ORDER — SERTRALINE HCL 100 MG PO TABS: 150.0000 mg | ORAL_TABLET | Freq: Every day | ORAL | 1 refills | Status: DC

## 2022-09-28 MED ORDER — NALTREXONE HCL 50 MG PO TABS: 50.0000 mg | ORAL_TABLET | Freq: Every day | ORAL | 3 refills | Status: DC

## 2022-09-28 MED ORDER — METHOCARBAMOL 500 MG PO TABS: 500.0000 mg | ORAL_TABLET | Freq: Four times a day (QID) | ORAL | 1 refills | Status: DC

## 2022-09-28 NOTE — Progress Notes (Signed)
Established patient visit   Patient: Connie Cole   DOB: 09-07-1970   52 y.o. Female  MRN: [id number] Visit Date: 09/28/2022   Chief Complaint  Patient presents with   Medical Management of Chronic Issues   Subjective    HPI  Follow up  -GAD --taking sertraline 100 mg daily   -has spot on face she would like further evaluation for.  Continues to drink alcohol every evening.  -would like to have naltrexone to help her to quit drinking altogether.  -does have right hip pain. This is muscular in nature.     Medications: Outpatient Medications Prior to Visit  Medication Sig   cyclobenzaprine (FLEXERIL) 10 MG tablet Take 0.5-1 tablets (5-10 mg total) by mouth at bedtime as needed for muscle spasms.   desloratadine (CLARINEX) 5 MG tablet Take 1 tablet (5 mg total) by mouth daily.   hydrOXYzine (ATARAX) 10 MG tablet Take 1 tablet (10 mg total) by mouth 3 (three) times daily as needed for itching.   KRILL OIL PO Take 1 tablet by mouth daily.   MAGNESIUM PO Take 1 tablet by mouth daily. Potassium   OVER THE COUNTER MEDICATION Place 1 patch onto the skin daily. Lifewave X39 stem cell Patch   OVER THE COUNTER MEDICATION Take 1 tablet by mouth daily. Instaflex joint support   valACYclovir (VALTREX) 1000 MG tablet Take 1 tablet (1,000 mg total) by mouth daily.   [DISCONTINUED] azelastine (OPTIVAR) 0.05 % ophthalmic solution Place 1 drop into both eyes 2 (two) times daily.   [DISCONTINUED] azithromycin (ZITHROMAX) 250 MG tablet z-pack - take as directed for 5 days   [DISCONTINUED] methocarbamol (ROBAXIN) 500 MG tablet Take 1 tablet (500 mg total) by mouth 4 (four) times daily.   [DISCONTINUED] methylPREDNISolone (MEDROL) 4 MG TBPK tablet Take by mouth as directed for 6 days   [DISCONTINUED] oxyCODONE (OXY IR/ROXICODONE) 5 MG immediate release tablet Take 1 tablet (5 mg total) by mouth every 4 (four) hours as needed for moderate pain.   [DISCONTINUED] sertraline (ZOLOFT) 100 MG tablet Take  1.5 tablets (150 mg total) by mouth daily.   [DISCONTINUED] triamcinolone cream (KENALOG) 0.5 % Apply 1 Application topically 2 (two) times daily.   [DISCONTINUED] acyclovir ointment (ZOVIRAX) 5 % Apply to effected areas up to 4 times daily as needed (Patient not taking: Reported on 09/28/2022)   No facility-administered medications prior to visit.    Review of Systems See HPI    Last CBC Lab Results  Component Value Date   WBC 6.3 10/11/2021   HGB 7.7 (L) 10/11/2021   HCT 26.0 (L) 10/11/2021   MCV 88.1 10/11/2021   MCH 26.1 10/11/2021   RDW 17.5 (H) 10/11/2021   PLT 279 10/11/2021   Last metabolic panel Lab Results  Component Value Date   GLUCOSE 98 10/11/2021   NA 141 10/11/2021   K 4.0 10/11/2021   CL 109 10/11/2021   CO2 26 10/11/2021   BUN 8 10/11/2021   CREATININE 0.54 10/11/2021   GFRNONAA >60 10/11/2021   CALCIUM 8.9 10/11/2021   PHOS 3.7 09/05/2017   PROT 6.9 02/11/2021   ALBUMIN 4.3 02/11/2021   LABGLOB 2.6 02/11/2021   AGRATIO 1.7 02/11/2021   BILITOT <0.2 02/11/2021   ALKPHOS 45 02/11/2021   AST 19 02/11/2021   ALT 15 02/11/2021   ANIONGAP 6 10/11/2021   Last lipids Lab Results  Component Value Date   CHOL 202 (H) 02/11/2021   HDL 92 02/11/2021   LDLCALC 87 02/11/2021  TRIG 139 02/11/2021   CHOLHDL 2.2 02/11/2021   Last hemoglobin A1c Lab Results  Component Value Date   HGBA1C 4.6 (L) 02/11/2021   Last thyroid functions Lab Results  Component Value Date   TSH 2.630 02/11/2021   Last vitamin D Lab Results  Component Value Date   VD25OH 42.5 09/05/2017       Objective     Today's Vitals   09/28/22 1604  BP: 110/77  Pulse: 89  SpO2: 97%  Weight: 165 lb (74.8 kg)  Height: 5\' 3"  (1.6 m)   Body mass index is 29.23 kg/m.  BP Readings from Last 3 Encounters:  09/28/22 110/77  04/13/22 114/80  03/24/22 114/76    Wt Readings from Last 3 Encounters:  09/28/22 165 lb (74.8 kg)  04/13/22 162 lb (73.5 kg)  03/24/22 162 lb 1.9 oz  (73.5 kg)    Physical Exam Vitals and nursing note reviewed.  Constitutional:      Appearance: Normal appearance. She is well-developed.  HENT:     Head: Normocephalic and atraumatic.     Nose: Nose normal.     Mouth/Throat:     Mouth: Mucous membranes are moist.     Pharynx: Oropharynx is clear.  Eyes:     Extraocular Movements: Extraocular movements intact.     Conjunctiva/sclera: Conjunctivae normal.     Pupils: Pupils are equal, round, and reactive to light.  Neck:     Vascular: No carotid bruit.  Cardiovascular:     Rate and Rhythm: Normal rate and regular rhythm.     Pulses: Normal pulses.     Heart sounds: Normal heart sounds.  Pulmonary:     Effort: Pulmonary effort is normal.     Breath sounds: Normal breath sounds.  Abdominal:     Palpations: Abdomen is soft.  Musculoskeletal:        General: Normal range of motion.     Cervical back: Normal range of motion and neck supple.  Lymphadenopathy:     Cervical: No cervical adenopathy.  Skin:    General: Skin is warm and dry.     Capillary Refill: Capillary refill takes less than 2 seconds.  Neurological:     General: No focal deficit present.     Mental Status: She is alert and oriented to person, place, and time.  Psychiatric:        Mood and Affect: Mood normal.        Behavior: Behavior normal.        Thought Content: Thought content normal.        Judgment: Judgment normal.       Assessment & Plan    Pain in joint of right hip Assessment & Plan: May take robaxin as needed and as previously prescribed. New prescription sent to her pharmacy.  -gentle stretching exercises recommended.   Orders: -     Methocarbamol; Take 1 tablet (500 mg total) by mouth 4 (four) times daily.  Dispense: 30 tablet; Refill: 1  GAD (generalized anxiety disorder) Assessment & Plan: Increase sertraline to 150 mg daily.  -reassess in 6 months and sooner if needed   Orders: -     Sertraline HCl; Take 1.5 tablets (150 mg  total) by mouth daily.  Dispense: 135 tablet; Refill: 1  Neoplasm of uncertain behavior of skin of face Assessment & Plan: Refer to dermatology for further evaluation.   Orders: -     Ambulatory referral to Dermatology  Alcohol use disorder, moderate, dependence (HCC)  Assessment & Plan: Restart naltrexone daily to help with alcohol overuse.   Orders: -     Naltrexone HCl; Take 1 tablet (50 mg total) by mouth daily.  Dispense: 30 tablet; Refill: 3     Return in about 6 months (around 03/30/2023) for mood.         Carlean Jews, NP  [facility] at [facility] [phone] (phone) [phone] (fax)  [facility]

## 2022-10-28 DIAGNOSIS — D485 Neoplasm of uncertain behavior of skin: Secondary | ICD-10-CM | POA: Insufficient documentation

## 2022-10-28 NOTE — Assessment & Plan Note (Signed)
Refer to dermatology for further evaluation. 

## 2022-10-28 NOTE — Assessment & Plan Note (Signed)
Restart naltrexone daily to help with alcohol overuse.

## 2022-10-28 NOTE — Assessment & Plan Note (Signed)
May take robaxin as needed and as previously prescribed. New prescription sent to her pharmacy.  -gentle stretching exercises recommended.

## 2022-10-28 NOTE — Assessment & Plan Note (Addendum)
Increase sertraline to 150 mg daily.  -reassess in 6 months and sooner if needed

## 2022-12-21 ENCOUNTER — Telehealth: Payer: Self-pay | Admitting: *Deleted

## 2022-12-21 ENCOUNTER — Ambulatory Visit: Payer: No Typology Code available for payment source | Admitting: Dermatology

## 2022-12-21 NOTE — Telephone Encounter (Signed)
Pt calling to see if she can get something sent in for heartburn, she said that none of the OTC is working and she wants to know if something stronger can be sent in.

## 2022-12-22 ENCOUNTER — Other Ambulatory Visit: Payer: Self-pay | Admitting: Nurse Practitioner

## 2022-12-22 DIAGNOSIS — R12 Heartburn: Secondary | ICD-10-CM

## 2022-12-22 MED ORDER — OMEPRAZOLE 40 MG PO CPDR: 40.0000 mg | DELAYED_RELEASE_CAPSULE | Freq: Every day | ORAL | 1 refills | Status: DC

## 2022-12-22 NOTE — Telephone Encounter (Signed)
Please let the patient know that I sent rx for omeprazole 40 mg daily to [facility].  Thanks so much.   -HB

## 2022-12-22 NOTE — Telephone Encounter (Signed)
Pt informed of below.  

## 2023-02-25 DIAGNOSIS — M5116 Intervertebral disc disorders with radiculopathy, lumbar region: Secondary | ICD-10-CM | POA: Diagnosis not present

## 2023-02-25 DIAGNOSIS — M47816 Spondylosis without myelopathy or radiculopathy, lumbar region: Secondary | ICD-10-CM | POA: Diagnosis not present

## 2023-03-02 ENCOUNTER — Inpatient Hospital Stay: Admission: RE | Admit: 2023-03-02 | Payer: Self-pay | Source: Ambulatory Visit

## 2023-03-15 ENCOUNTER — Emergency Department (HOSPITAL_BASED_OUTPATIENT_CLINIC_OR_DEPARTMENT_OTHER)
Admission: EM | Admit: 2023-03-15 | Discharge: 2023-03-16 | Disposition: A | Discharge status: Home / Self Care | Payer: 59 | Attending: Emergency Medicine | Admitting: Emergency Medicine

## 2023-03-15 ENCOUNTER — Other Ambulatory Visit: Payer: Self-pay

## 2023-03-15 ENCOUNTER — Encounter (HOSPITAL_BASED_OUTPATIENT_CLINIC_OR_DEPARTMENT_OTHER): Payer: Self-pay | Admitting: Emergency Medicine

## 2023-03-15 ENCOUNTER — Emergency Department (HOSPITAL_BASED_OUTPATIENT_CLINIC_OR_DEPARTMENT_OTHER): Payer: 59

## 2023-03-15 DIAGNOSIS — Z96641 Presence of right artificial hip joint: Secondary | ICD-10-CM | POA: Insufficient documentation

## 2023-03-15 DIAGNOSIS — K529 Noninfective gastroenteritis and colitis, unspecified: Secondary | ICD-10-CM | POA: Diagnosis not present

## 2023-03-15 DIAGNOSIS — R1031 Right lower quadrant pain: Secondary | ICD-10-CM

## 2023-03-15 DIAGNOSIS — Z91040 Latex allergy status: Secondary | ICD-10-CM | POA: Diagnosis not present

## 2023-03-15 LAB — URINALYSIS, ROUTINE W REFLEX MICROSCOPIC
Bilirubin Urine: NEGATIVE
Glucose, UA: NEGATIVE mg/dL
Hgb urine dipstick: NEGATIVE
Ketones, ur: NEGATIVE mg/dL
Leukocytes,Ua: NEGATIVE
Nitrite: NEGATIVE
Protein, ur: NEGATIVE mg/dL
Specific Gravity, Urine: 1.018 (ref 1.005–1.030)
pH: 5.5 (ref 5.0–8.0)

## 2023-03-15 LAB — LIPASE, BLOOD: Lipase: 31 U/L (ref 11–51)

## 2023-03-15 LAB — COMPREHENSIVE METABOLIC PANEL WITH GFR
ALT: 13 U/L (ref 0–44)
AST: 16 U/L (ref 15–41)
Albumin: 4.3 g/dL (ref 3.5–5.0)
Alkaline Phosphatase: 42 U/L (ref 38–126)
Anion gap: 8 (ref 5–15)
BUN: 19 mg/dL (ref 6–20)
CO2: 27 mmol/L (ref 22–32)
Calcium: 9.1 mg/dL (ref 8.9–10.3)
Chloride: 98 mmol/L (ref 98–111)
Creatinine, Ser: 0.67 mg/dL (ref 0.44–1.00)
GFR, Estimated: >60 mL/min (ref >60)
Glucose, Bld: 93 mg/dL (ref 70–99)
Potassium: 3.2 mmol/L — ABNORMAL LOW (ref 3.5–5.1)
Sodium: 133 mmol/L — ABNORMAL LOW (ref 135–145)
Total Bilirubin: 0.3 mg/dL (ref 0.3–1.2)
Total Protein: 7.1 g/dL (ref 6.5–8.1)

## 2023-03-15 LAB — CBC
HCT: 37.4 % (ref 36.0–46.0)
Hemoglobin: 12.5 g/dL (ref 12.0–15.0)
MCH: 34 pg (ref 26.0–34.0)
MCHC: 33.4 g/dL (ref 30.0–36.0)
MCV: 101.6 fL — ABNORMAL HIGH (ref 80.0–100.0)
Platelets: 330 10*3/uL (ref 150–400)
RBC: 3.68 MIL/uL — ABNORMAL LOW (ref 3.87–5.11)
RDW: 11.9 % (ref 11.5–15.5)
WBC: 10.9 10*3/uL — ABNORMAL HIGH (ref 4.0–10.5)
nRBC: 0 % (ref 0.0–0.2)

## 2023-03-15 MED ORDER — SODIUM CHLORIDE 0.9 % IV BOLUS
1000.0000 mL | Freq: Once | INTRAVENOUS | Status: AC
  Administered 2023-03-15: 1000 mL via INTRAVENOUS

## 2023-03-15 MED ORDER — ONDANSETRON HCL 4 MG/2ML IJ SOLN
4.0000 mg | Freq: Once | INTRAMUSCULAR | Status: AC
  Administered 2023-03-15: 4 mg via INTRAVENOUS
  Filled 2023-03-15: qty 2

## 2023-03-15 MED ORDER — MORPHINE SULFATE (PF) 4 MG/ML IV SOLN
4.0000 mg | Freq: Once | INTRAVENOUS | Status: AC
  Administered 2023-03-15: 4 mg via INTRAVENOUS
  Filled 2023-03-15: qty 1

## 2023-03-15 MED ORDER — IOHEXOL 300 MG/ML  SOLN
100.0000 mL | Freq: Once | INTRAMUSCULAR | Status: AC | PRN
Start: 2023-03-15 — End: 2023-03-15
  Administered 2023-03-15: 85 mL via INTRAVENOUS

## 2023-03-15 MED ORDER — KETOROLAC TROMETHAMINE 30 MG/ML IJ SOLN
30.0000 mg | Freq: Once | INTRAMUSCULAR | Status: AC
  Administered 2023-03-15: 30 mg via INTRAVENOUS
  Filled 2023-03-15: qty 1

## 2023-03-15 NOTE — ED Triage Notes (Signed)
Pt in with sharp R low back and RLQ pain that began 4hrs ago. Pt has recent pulled muscle to low back, currently has TENS unit on giving no relief. Pt also has had constipation past few days and has been taking laxatives and now having diarrhea. Experiencing bloating and nausea now. Still has appendix

## 2023-03-15 NOTE — ED Provider Notes (Signed)
[place] EMERGENCY DEPARTMENT AT [facility] Provider Note   CSN: [id number] Arrival date & time: 03/15/23  2013     History  Chief Complaint  Patient presents with   Abdominal Pain   Flank Pain    Connie Cole is a 52 y.o. female.  Patient is a 52 year old female with past medical history of hysterectomy, hernia repair, right hip replacement.  Patient presenting today with complaints of right lower quadrant pain.  This started earlier this evening in the absence of any injury or trauma.  She reports dealing with what she calls a "pulled muscle" for the past several weeks, then today she developed pain in her lower abdomen and back.  This pain is different than what she has been experiencing the past several weeks.  She feels bloated and constipated.  She did take some MiraLAX and had a bowel movement, but pain is still there.  No urinary complaints.  Pain worse with movement and palpation.  No alleviating factors.  The history is provided by the patient.       Home Medications Prior to Admission medications   Medication Sig Start Date End Date Taking? Authorizing Provider  omeprazole (PRILOSEC) 40 MG capsule Take 1 capsule (40 mg total) by mouth daily. 12/22/22   Carlean Jews, NP  cyclobenzaprine (FLEXERIL) 10 MG tablet Take 0.5-1 tablets (5-10 mg total) by mouth at bedtime as needed for muscle spasms. 05/23/22   Carlean Jews, NP  desloratadine (CLARINEX) 5 MG tablet Take 1 tablet (5 mg total) by mouth daily. 03/30/22   Carlean Jews, NP  hydrOXYzine (ATARAX) 10 MG tablet Take 1 tablet (10 mg total) by mouth 3 (three) times daily as needed for itching. 01/04/22   Carlean Jews, NP  KRILL OIL PO Take 1 tablet by mouth daily.    [provider]  MAGNESIUM PO Take 1 tablet by mouth daily. Potassium    [provider]  methocarbamol (ROBAXIN) 500 MG tablet Take 1 tablet (500 mg total) by mouth 4 (four) times daily. 09/28/22   Carlean Jews, NP  naltrexone (DEPADE) 50 MG tablet Take 1 tablet (50 mg total) by mouth daily. 09/28/22   Carlean Jews, NP  OVER THE COUNTER MEDICATION Place 1 patch onto the skin daily. Lifewave X39 stem cell Patch    [provider]  OVER THE COUNTER MEDICATION Take 1 tablet by mouth daily. Instaflex joint support    [provider]  sertraline (ZOLOFT) 100 MG tablet Take 1.5 tablets (150 mg total) by mouth daily. 09/28/22   Carlean Jews, NP  valACYclovir (VALTREX) 1000 MG tablet Take 1 tablet (1,000 mg total) by mouth daily. 04/11/22   Carlean Jews, NP      Allergies    Latex and Latex    Review of Systems   Review of Systems  All other systems reviewed and are negative.   Physical Exam Updated Vital Signs BP 121/85   Pulse 67   Temp 98.3 F (36.8 C) (Oral)   Resp 14   Wt 70.3 kg   LMP 09/22/2021   SpO2 93%   BMI 27.46 kg/m  Physical Exam Vitals and nursing note reviewed.  Constitutional:      General: She is not in acute distress.    Appearance: She is well-developed. She is not diaphoretic.  HENT:     Head: Normocephalic and atraumatic.  Cardiovascular:     Rate and Rhythm: Normal rate and regular  rhythm.     Heart sounds: No murmur heard.    No friction rub. No gallop.  Pulmonary:     Effort: Pulmonary effort is normal. No respiratory distress.     Breath sounds: Normal breath sounds. No wheezing.  Abdominal:     General: Bowel sounds are normal. There is no distension.     Palpations: Abdomen is soft.     Tenderness: There is abdominal tenderness in the right lower quadrant. There is no right CVA tenderness, left CVA tenderness, guarding or rebound.  Musculoskeletal:        General: Normal range of motion.     Cervical back: Normal range of motion and neck supple.  Skin:    General: Skin is warm and dry.  Neurological:     General: No focal deficit present.     Mental Status: She is alert and oriented to person, place, and time.     ED  Results / Procedures / Treatments   Labs (all labs ordered are listed, but only abnormal results are displayed) Labs Reviewed  COMPREHENSIVE METABOLIC PANEL - Abnormal; Notable for the following components:      Result Value   Sodium 133 (*)    Potassium 3.2 (*)    All other components within normal limits  CBC - Abnormal; Notable for the following components:   WBC 10.9 (*)    RBC 3.68 (*)    MCV 101.6 (*)    All other components within normal limits  URINALYSIS, ROUTINE W REFLEX MICROSCOPIC - Abnormal; Notable for the following components:   Color, Urine COLORLESS (*)    All other components within normal limits  LIPASE, BLOOD    EKG None  Radiology No results found.  Procedures Procedures  {Document cardiac monitor, telemetry assessment procedure when appropriate:1}  Medications Ordered in ED Medications  sodium chloride 0.9 % bolus 1,000 mL (has no administration in time range)  ondansetron (ZOFRAN) injection 4 mg (has no administration in time range)  ketorolac (TORADOL) 30 MG/ML injection 30 mg (has no administration in time range)  morphine (PF) 4 MG/ML injection 4 mg (has no administration in time range)    ED Course/ Medical Decision Making/ A&P   {   Click here for ABCD2, HEART and other calculatorsREFRESH Note before signing :1}                              Medical Decision Making Amount and/or Complexity of Data Reviewed Labs: ordered. Radiology: ordered.  Risk Prescription drug management.   ***  {Document critical care time when appropriate:1} {Document review of labs and clinical decision tools ie heart score, Chads2Vasc2 etc:1}  {Document your independent review of radiology images, and any outside records:1} {Document your discussion with family members, caretakers, and with consultants:1} {Document social determinants of health affecting pt's care:1} {Document your decision making why or why not admission, treatments were needed:1} Final  Clinical Impression(s) / ED Diagnoses Final diagnoses:  None    Rx / DC Orders ED Discharge Orders     None

## 2023-03-16 MED ORDER — HYDROCODONE-ACETAMINOPHEN 5-325 MG PO TABS: 1.0000 | ORAL_TABLET | Freq: Four times a day (QID) | ORAL | 0 refills | Status: DC | PRN

## 2023-03-16 NOTE — Discharge Instructions (Signed)
Take ibuprofen 600 mg every 6 hours as needed for pain.  Begin taking hydrocodone as prescribed as needed for pain not relieved with ibuprofen.  Follow-up with primary doctor if not improving in the next few days, and return to the ER if you develop severe abdominal pain, bloody stools, or for other new and concerning symptoms.

## 2023-03-30 ENCOUNTER — Ambulatory Visit: Payer: No Typology Code available for payment source | Admitting: Family Medicine

## 2023-03-30 ENCOUNTER — Ambulatory Visit: Payer: No Typology Code available for payment source | Admitting: Nurse Practitioner

## 2023-04-04 ENCOUNTER — Encounter: Payer: Self-pay | Admitting: Family Medicine

## 2023-04-04 ENCOUNTER — Ambulatory Visit (INDEPENDENT_AMBULATORY_CARE_PROVIDER_SITE_OTHER): Payer: 59 | Admitting: Family Medicine

## 2023-04-04 VITALS — BP 107/81 | HR 78 | Resp 18 | Ht 63.0 in | Wt 165.0 lb

## 2023-04-04 DIAGNOSIS — F1020 Alcohol dependence, uncomplicated: Secondary | ICD-10-CM

## 2023-04-04 DIAGNOSIS — R12 Heartburn: Secondary | ICD-10-CM

## 2023-04-04 DIAGNOSIS — F411 Generalized anxiety disorder: Secondary | ICD-10-CM | POA: Diagnosis not present

## 2023-04-04 DIAGNOSIS — Z6827 Body mass index (BMI) 27.0-27.9, adult: Secondary | ICD-10-CM

## 2023-04-04 DIAGNOSIS — Z1159 Encounter for screening for other viral diseases: Secondary | ICD-10-CM

## 2023-04-04 MED ORDER — NALTREXONE HCL 50 MG PO TABS: 50.0000 mg | ORAL_TABLET | Freq: Every day | ORAL | 3 refills | Status: AC

## 2023-04-04 MED ORDER — SERTRALINE HCL 100 MG PO TABS: 150.0000 mg | ORAL_TABLET | Freq: Every day | ORAL | 1 refills | Status: DC

## 2023-04-04 MED ORDER — OMEPRAZOLE 40 MG PO CPDR: 40.0000 mg | DELAYED_RELEASE_CAPSULE | Freq: Every day | ORAL | 1 refills | Status: DC

## 2023-04-04 NOTE — Progress Notes (Signed)
Established Patient Office Visit  Subjective   Patient ID: Connie Cole, female    DOB: [birth year]  Age: 52 y.o. MRN: [id number]  Chief Complaint  Patient presents with   Anxiety   Depression    HPI Connie Cole is a 52 y.o. female presenting today for follow up of mood. Mood: Patient is here to follow up for depression and anxiety which have mostly been a reaction from grief when she lost her father 5 years ago, currently managing with Zoloft 150 mg daily which was increased at her last appointment. Taking medication without side effects, reports excellent compliance with treatment. Denies mood changes or SI/HI. She feels mood is improved since last visit.  She continues to work to cut back on alcohol use and has now gone 30 days without needing alcohol or any sleep medication to fall asleep and stay asleep at night.  She finds that she is still able to have a beverage occasionally in social situations without needing to drink high quantities daily.  As she continues to make these changes, she is looking to eventually come off of the Zoloft sometime in the future.  She plans to do this slowly going from her current dose of 150 and gradually decreasing over several months.     09/28/2022    4:28 PM 04/13/2022    4:22 PM 03/24/2022    2:04 PM  Depression screen PHQ 2/9  Decreased Interest 0 0 0  Down, Depressed, Hopeless 0 0 0  PHQ - 2 Score 0 0 0  Altered sleeping 2 0 0  Tired, decreased energy 1 0 0  Change in appetite 0 0 0  Feeling bad or failure about yourself  0 0 0  Trouble concentrating 0 0 0  Moving slowly or fidgety/restless 0 0 0  Suicidal thoughts 0 0 0  PHQ-9 Score 3 0 0       04/13/2022    4:22 PM 03/24/2022    2:04 PM 01/04/2022    3:16 PM 07/19/2021    4:00 PM  GAD 7 : Generalized Anxiety Score  Nervous, Anxious, on Edge 0 0 0 0  Control/stop worrying 0 0 0 0  Worry too much - different things 0 0 0 0  Trouble relaxing 0 0 0 0  Restless 0 0 0 0  Easily  annoyed or irritable 0 0 0 0  Afraid - awful might happen 0 0 0 0  Total GAD 7 Score 0 0 0 0    Outpatient Medications Prior to Visit  Medication Sig   cyclobenzaprine (FLEXERIL) 10 MG tablet Take 0.5-1 tablets (5-10 mg total) by mouth at bedtime as needed for muscle spasms.   desloratadine (CLARINEX) 5 MG tablet Take 1 tablet (5 mg total) by mouth daily.   hydrOXYzine (ATARAX) 10 MG tablet Take 1 tablet (10 mg total) by mouth 3 (three) times daily as needed for itching.   KRILL OIL PO Take 1 tablet by mouth daily.   MAGNESIUM PO Take 1 tablet by mouth daily. Potassium   methocarbamol (ROBAXIN) 500 MG tablet Take 1 tablet (500 mg total) by mouth 4 (four) times daily.   OVER THE COUNTER MEDICATION Place 1 patch onto the skin daily. Lifewave X39 stem cell Patch   OVER THE COUNTER MEDICATION Take 1 tablet by mouth daily. Instaflex joint support   valACYclovir (VALTREX) 1000 MG tablet Take 1 tablet (1,000 mg total) by mouth daily.   [DISCONTINUED] HYDROcodone-acetaminophen (NORCO) 5-325 MG tablet Take 1-2  tablets by mouth every 6 (six) hours as needed.   [DISCONTINUED] naltrexone (DEPADE) 50 MG tablet Take 1 tablet (50 mg total) by mouth daily.   [DISCONTINUED] omeprazole (PRILOSEC) 40 MG capsule Take 1 capsule (40 mg total) by mouth daily.   [DISCONTINUED] sertraline (ZOLOFT) 100 MG tablet Take 1.5 tablets (150 mg total) by mouth daily.   No facility-administered medications prior to visit.    ROS Negative unless otherwise noted in HPI   Objective:     BP 107/81 (BP Location: Left Arm, Patient Position: Sitting, Cuff Size: Normal)   Pulse 78   Resp 18   Ht 5\' 3"  (1.6 m)   Wt 165 lb (74.8 kg)   LMP 09/22/2021   SpO2 97%   BMI 29.23 kg/m   Physical Exam Constitutional:      General: She is not in acute distress.    Appearance: Normal appearance.  HENT:     Head: Normocephalic and atraumatic.  Pulmonary:     Effort: Pulmonary effort is normal. No respiratory distress.   Musculoskeletal:     Cervical back: Normal range of motion.  Neurological:     General: No focal deficit present.     Mental Status: She is alert and oriented to person, place, and time. Mental status is at baseline.  Psychiatric:        Mood and Affect: Mood normal.        Thought Content: Thought content normal.        Judgment: Judgment normal.      Assessment & Plan:  GAD (generalized anxiety disorder) Assessment & Plan: PHQ-9 score 0, GAD-7 score 0.  Stable, continue Zoloft 150 mg daily.  Patient will send me a message when she is ready to begin tapering off of Zoloft.  Orders: -     Sertraline HCl; Take 1.5 tablets (150 mg total) by mouth daily.  Dispense: 135 tablet; Refill: 1  Alcohol use disorder, moderate, dependence (HCC) Assessment & Plan: Sent refill of naltrexone to help with her ongoing efforts to decrease alcohol consumption.  Congratulated her on the progress that she has made so far.  Orders: -     Naltrexone HCl; Take 1 tablet (50 mg total) by mouth daily.  Dispense: 30 tablet; Refill: 3  Heartburn -     Omeprazole; Take 1 capsule (40 mg total) by mouth daily.  Dispense: 90 capsule; Refill: 1    Return in about 6 months (around 10/03/2023) for annual physical, fasting blood work 1 week before.    Melida Quitter, PA

## 2023-04-04 NOTE — Assessment & Plan Note (Signed)
PHQ-9 score 0, GAD-7 score 0.  Stable, continue Zoloft 150 mg daily.  Patient will send me a message when she is ready to begin tapering off of Zoloft.

## 2023-04-04 NOTE — Assessment & Plan Note (Signed)
Sent refill of naltrexone to help with her ongoing efforts to decrease alcohol consumption.  Congratulated her on the progress that she has made so far.

## 2023-04-05 ENCOUNTER — Encounter: Payer: Self-pay | Admitting: Family Medicine

## 2023-04-13 ENCOUNTER — Other Ambulatory Visit (HOSPITAL_COMMUNITY): Payer: Self-pay | Admitting: Gastroenterology

## 2023-04-13 ENCOUNTER — Encounter (HOSPITAL_COMMUNITY): Payer: Self-pay | Admitting: Gastroenterology

## 2023-04-13 DIAGNOSIS — R1011 Right upper quadrant pain: Secondary | ICD-10-CM

## 2023-04-19 ENCOUNTER — Telehealth: Payer: Self-pay | Admitting: *Deleted

## 2023-04-19 NOTE — Telephone Encounter (Signed)
Contacted pt and informed her of below.  Told her that dr. Loreta Ave had ordered another CT and said it is to evaluate for a bowel obstruction.  She will call [facility] to schedule.and go from there she said. Routing to PCP as an [place]

## 2023-04-19 NOTE — Telephone Encounter (Signed)
Pt LVM to say that she was seen by her gyn doctor and they did a vaginal scan, and they did not find any reason why patient is having pain.  Patient is requesting to know if there is another referral for a specialist  that can do further testing to find out what may be causing this pain.   She said it is so bad that she can hardly drive. Please advise.

## 2023-04-19 NOTE — Telephone Encounter (Signed)
I am not familiar with the pain that she is referring to, I do not believe that we discussed at at her last appointment.  If her GYN is currently working up the issue, I recommend that she follow-up with them.  If it is related to previous abdominal pain, I would recommend following up with her gastroenterologist.

## 2023-04-24 ENCOUNTER — Ambulatory Visit (HOSPITAL_COMMUNITY)
Admission: RE | Admit: 2023-04-24 | Discharge: 2023-04-24 | Disposition: A | Discharge status: Home / Self Care | Payer: 59 | Source: Ambulatory Visit | Attending: Gastroenterology | Admitting: Gastroenterology

## 2023-04-24 DIAGNOSIS — R1011 Right upper quadrant pain: Secondary | ICD-10-CM | POA: Insufficient documentation

## 2023-04-24 MED ORDER — IOHEXOL 300 MG/ML  SOLN
100.0000 mL | Freq: Once | INTRAMUSCULAR | Status: AC | PRN
  Administered 2023-04-24: 100 mL via INTRAVENOUS

## 2023-08-03 ENCOUNTER — Encounter: Payer: Self-pay | Admitting: Family Medicine

## 2023-09-21 ENCOUNTER — Telehealth: Payer: Self-pay | Admitting: *Deleted

## 2023-09-21 NOTE — Telephone Encounter (Signed)
 Lvm for pt to call back to see about scheduling her an appointment within a month of labs or cancelling those and scheduling her out a ways and doing the labs the week before that or she can wait till new provider starts and we can call and set her up then.

## 2023-09-22 ENCOUNTER — Other Ambulatory Visit: Payer: Self-pay | Admitting: Family Medicine

## 2023-09-22 DIAGNOSIS — F411 Generalized anxiety disorder: Secondary | ICD-10-CM

## 2023-09-22 MED ORDER — SERTRALINE HCL 100 MG PO TABS: 150.0000 mg | ORAL_TABLET | Freq: Every day | ORAL | 1 refills | Status: AC

## 2023-09-22 NOTE — Telephone Encounter (Signed)
 Copied from CRM [id number]. Topic: Clinical - Medication Refill >> Sep 22, 2023 12:44 PM Fuller Mandril wrote: Most Recent Primary Care Visit:  Provider: Saralyn Pilar A  Department: [facility]  Visit Type: OFFICE VISIT 20  Date: 04/04/2023  Medication: sertraline (ZOLOFT) 100 MG tablet   Has the patient contacted their pharmacy? No (Agent: If no, request that the patient contact the pharmacy for the refill. If patient does not wish to contact the pharmacy document the reason why and proceed with request.) (Agent: If yes, when and what did the pharmacy advise?)  Is this the correct pharmacy for this prescription? Yes If no, delete pharmacy and type the correct one.  This is the patient's preferred pharmacy:  [facility] - [place], [place] - [address] [place] [address] Phone: [phone] Fax: [phone]   Has the prescription been filled recently? No  Is the patient out of the medication? No - couple days left   Has the patient been seen for an appointment in the last year OR does the patient have an upcoming appointment? Yes  Can we respond through MyChart? Yes  Agent: Please be advised that Rx refills may take up to 3 business days. We ask that you follow-up with your pharmacy.

## 2023-09-26 ENCOUNTER — Other Ambulatory Visit: Payer: 59

## 2023-10-03 ENCOUNTER — Encounter: Payer: 59 | Admitting: Family Medicine

## 2023-10-19 ENCOUNTER — Ambulatory Visit: Payer: Self-pay

## 2023-10-19 ENCOUNTER — Telehealth: Payer: Self-pay | Admitting: Physician Assistant

## 2023-10-19 DIAGNOSIS — R062 Wheezing: Secondary | ICD-10-CM

## 2023-10-19 DIAGNOSIS — J019 Acute sinusitis, unspecified: Secondary | ICD-10-CM

## 2023-10-19 DIAGNOSIS — B9689 Other specified bacterial agents as the cause of diseases classified elsewhere: Secondary | ICD-10-CM

## 2023-10-19 MED ORDER — PREDNISONE 10 MG (21) PO TBPK
ORAL_TABLET | ORAL | 0 refills | Status: DC
Start: 2023-10-19 — End: 2024-02-23

## 2023-10-19 MED ORDER — AMOXICILLIN-POT CLAVULANATE 875-125 MG PO TABS
1.0000 | ORAL_TABLET | Freq: Two times a day (BID) | ORAL | 0 refills | Status: DC
Start: 2023-10-19 — End: 2024-02-23

## 2023-10-19 NOTE — Progress Notes (Signed)
 Virtual Visit Consent   Connie Cole, you are scheduled for a virtual visit with a [facility] provider today. Just as with appointments in the office, your consent must be obtained to participate. Your consent will be active for this visit and any virtual visit you may have with one of our providers in the next 365 days. If you have a MyChart account, a copy of this consent can be sent to you electronically.  As this is a virtual visit, video technology does not allow for your provider to perform a traditional examination. This may limit your provider's ability to fully assess your condition. If your provider identifies any concerns that need to be evaluated in person or the need to arrange testing (such as labs, EKG, etc.), we will make arrangements to do so. Although advances in technology are sophisticated, we cannot ensure that it will always work on either your end or our end. If the connection with a video visit is poor, the visit may have to be switched to a telephone visit. With either a video or telephone visit, we are not always able to ensure that we have a secure connection.  By engaging in this virtual visit, you consent to the provision of healthcare and authorize for your insurance to be billed (if applicable) for the services provided during this visit. Depending on your insurance coverage, you may receive a charge related to this service.  I need to obtain your verbal consent now. Are you willing to proceed with your visit today? Connie Cole has provided verbal consent on 10/19/2023 for a virtual visit (video or telephone). [name], PA-C  Date: 10/19/2023 11:58 AM   Virtual Visit via Video Note   I, [name], connected with  Connie Cole  ([id number], 1971/05/11) on 10/19/23 at 11:45 AM EDT by a video-enabled telemedicine application and verified that I am speaking with the correct person using two identifiers.  Location: Patient: Virtual Visit Location  Patient: Home Provider: Virtual Visit Location Provider: Home Office   I discussed the limitations of evaluation and management by telemedicine and the availability of in person appointments. The patient expressed understanding and agreed to proceed.    History of Present Illness: Connie Cole is a 53 y.o. who identifies as a female who was assigned female at birth, and is being seen today for sinus congestion, cough, wheezing, snoring.  HPI: URI  This is a new problem. The current episode started 1 to 4 weeks ago. The problem has been gradually worsening. There has been no fever. Associated symptoms include congestion, coughing (productive), headaches, nausea (from drainage), neck pain, a plugged ear sensation, rhinorrhea, sinus pain, sneezing and wheezing. Pertinent negatives include no diarrhea, ear pain, sore throat (just scracthy) or vomiting. Associated symptoms comments: Snoring, hoarse voice, swelling over sinus area. She has tried antihistamine (mucinex, allegra, flonase , local honey, elderberry, breathe right strips, zzquil strips) for the symptoms. The treatment provided no relief.     Problems:  Patient Active Problem List   Diagnosis Date Noted   Neoplasm of uncertain behavior of skin of face 10/28/2022   Herpes simplex virus (HSV) infection 03/24/2022   Menorrhagia 10/08/2021   Rectal prolapse 10/08/2021   Alcohol use disorder, moderate, dependence (HCC) 12/02/2020   BMI 27.0-27.9,adult 01/11/2019   Hypokalemia 01/11/2019   S/P right THA, AA 01/10/2019   Hyperprolactinemia (HCC) 03/06/2018   Environmental and seasonal allergies 11/15/2017   Chronic pansinusitis 11/15/2017   Cervical disc disorder 11/15/2017   Muscle  spasm- neck muscles 11/15/2017   H/O: pituitary tumor 09/06/2017   Subclinical hypothyroidism 09/06/2017   Generalized osteoarthritis of multiple sites 09/06/2017   Primary insomnia 09/06/2017   GAD (generalized anxiety disorder) 05/16/2017   Numbness and  tingling in both arms/ hands 05/16/2017   Non-cardiac chest pain- assoc with emotional stress 05/16/2017   Family history of stroke (cerebrovascular)- mother 45's 01/18/2016   Family history of atrial fibrillation 01/18/2016   Seborrheic dermatitis of scalp 12/16/2015   GERD (gastroesophageal reflux disease) 12/16/2015    Allergies:  Allergies  Allergen Reactions   Latex Swelling   Latex Rash   Medications:  Current Outpatient Medications:    amoxicillin -clavulanate (AUGMENTIN ) 875-125 MG tablet, Take 1 tablet by mouth 2 (two) times daily., Disp: 14 tablet, Rfl: 0   predniSONE  (STERAPRED UNI-PAK 21 TAB) 10 MG (21) TBPK tablet, 6 day taper; take as directed on package instructions, Disp: 21 tablet, Rfl: 0   cyclobenzaprine  (FLEXERIL ) 10 MG tablet, Take 0.5-1 tablets (5-10 mg total) by mouth at bedtime as needed for muscle spasms., Disp: 30 tablet, Rfl: 1   desloratadine  (CLARINEX ) 5 MG tablet, Take 1 tablet (5 mg total) by mouth daily., Disp: 30 tablet, Rfl: 2   hydrOXYzine  (ATARAX ) 10 MG tablet, Take 1 tablet (10 mg total) by mouth 3 (three) times daily as needed for itching., Disp: 30 tablet, Rfl: 0   KRILL OIL PO, Take 1 tablet by mouth daily., Disp: , Rfl:    MAGNESIUM  PO, Take 1 tablet by mouth daily. Potassium, Disp: , Rfl:    methocarbamol  (ROBAXIN ) 500 MG tablet, Take 1 tablet (500 mg total) by mouth 4 (four) times daily., Disp: 30 tablet, Rfl: 1   naltrexone  (DEPADE) 50 MG tablet, Take 1 tablet (50 mg total) by mouth daily., Disp: 30 tablet, Rfl: 3   omeprazole  (PRILOSEC) 40 MG capsule, Take 1 capsule (40 mg total) by mouth daily., Disp: 90 capsule, Rfl: 1   OVER THE COUNTER MEDICATION, Place 1 patch onto the skin daily. Lifewave X39 stem cell Patch, Disp: , Rfl:    OVER THE COUNTER MEDICATION, Take 1 tablet by mouth daily. Instaflex joint support, Disp: , Rfl:    sertraline  (ZOLOFT ) 100 MG tablet, Take 1.5 tablets (150 mg total) by mouth daily., Disp: 135 tablet, Rfl: 1    valACYclovir  (VALTREX ) 1000 MG tablet, Take 1 tablet (1,000 mg total) by mouth daily., Disp: 30 tablet, Rfl: 5  Observations/Objective: Patient is well-developed, well-nourished in no acute distress.  Resting comfortably at home.  Head is normocephalic, atraumatic.  No labored breathing.  Speech is clear and coherent with logical content.  Patient is alert and oriented at baseline.    Assessment and Plan: 1. Acute bacterial sinusitis (Primary) - amoxicillin -clavulanate (AUGMENTIN ) 875-125 MG tablet; Take 1 tablet by mouth 2 (two) times daily.  Dispense: 14 tablet; Refill: 0  2. Wheezing - predniSONE  (STERAPRED UNI-PAK 21 TAB) 10 MG (21) TBPK tablet; 6 day taper; take as directed on package instructions  Dispense: 21 tablet; Refill: 0  - Worsening symptoms that have not responded to OTC medications.  - Will give Augmentin  and Prednisone  - Continue allergy medications.  - Steam and humidifier can help - Stay well hydrated and get plenty of rest.  - Seek in person evaluation if no symptom improvement or if symptoms worsen   Follow Up Instructions: I discussed the assessment and treatment plan with the patient. The patient was provided an opportunity to ask questions and all were answered. The patient  agreed with the plan and demonstrated an understanding of the instructions.  A copy of instructions were sent to the patient via MyChart unless otherwise noted below.    The patient was advised to call back or seek an in-person evaluation if the symptoms worsen or if the condition fails to improve as anticipated.    [name], PA-C

## 2023-10-19 NOTE — Telephone Encounter (Signed)
 Copied from CRM [id number]. Topic: Clinical - Red Word Triage >> Oct 19, 2023 10:35 AM Ivette P wrote: [facility] that prompted transfer to Nurse Triage: severe allergies - mucienx alegra flonase .  snoring, Wheezing    Chief Complaint: Cough with yellow mucus, sinus congestion, snoring, wheezing. Symptoms: Above Frequency: weeks Pertinent Negatives: Patient denies fever Disposition: [] ED /[] Urgent Care (no appt availability in office) / [] Appointment(In office/virtual)/ [x]  [facility]/ [] Home Care/ [] Refused Recommended Disposition /[] [facility]/ []  Follow-up with PCP Additional Notes: Agrees with VV.  Reason for Disposition  SEVERE coughing spells (e.g., whooping sound after coughing, vomiting after coughing)  Answer Assessment - Initial Assessment Questions 1. ONSET: "When did the cough begin?"      weeks 2. SEVERITY: "How bad is the cough today?"      Severe 3. SPUTUM: "Describe the color of your sputum" (none, dry cough; clear, white, yellow, green)     Yellow 4. HEMOPTYSIS: "Are you coughing up any blood?" If so ask: "How much?" (flecks, streaks, tablespoons, etc.)     No 5. DIFFICULTY BREATHING: "Are you having difficulty breathing?" If Yes, ask: "How bad is it?" (e.g., mild, moderate, severe)    - MILD: No SOB at rest, mild SOB with walking, speaks normally in sentences, can lie down, no retractions, pulse < 100.    - MODERATE: SOB at rest, SOB with minimal exertion and prefers to sit, cannot lie down flat, speaks in phrases, mild retractions, audible wheezing, pulse 100-120.    - SEVERE: Very SOB at rest, speaks in single words, struggling to breathe, sitting hunched forward, retractions, pulse > 120      No 6. FEVER: "Do you have a fever?" If Yes, ask: "What is your temperature, how was it measured, and when did it start?"     No 7. CARDIAC HISTORY: "Do you have any history of heart disease?" (e.g., heart attack, congestive heart failure)      No 8.  LUNG HISTORY: "Do you have any history of lung disease?"  (e.g., pulmonary embolus, asthma, emphysema)     No 9. PE RISK FACTORS: "Do you have a history of blood clots?" (or: recent major surgery, recent prolonged travel, bedridden)     No 10. OTHER SYMPTOMS: "Do you have any other symptoms?" (e.g., runny nose, wheezing, chest pain)       Wheezing, snoring,cough 11. PREGNANCY: "Is there any chance you are pregnant?" "When was your last menstrual period?"       no 12. TRAVEL: "Have you traveled out of the country in the last month?" (e.g., travel history, exposures)       no  Protocols used: Cough - Acute Productive-A-AH

## 2023-10-19 NOTE — Patient Instructions (Signed)
 Connie Cole, thank you for joining [name], PA-C for today's virtual visit.  While this provider is not your primary care provider (PCP), if your PCP is located in our provider database this encounter information will be shared with them immediately following your visit.   A [facility] MyChart account gives you access to today's visit and all your visits, tests, and labs performed at [facility] " click here if you don't have a [facility] MyChart account or go to [email or web address]  Consent: (Patient) Connie Cole provided verbal consent for this virtual visit at the beginning of the encounter.  Current Medications:  Current Outpatient Medications:    amoxicillin -clavulanate (AUGMENTIN ) 875-125 MG tablet, Take 1 tablet by mouth 2 (two) times daily., Disp: 14 tablet, Rfl: 0   predniSONE  (STERAPRED UNI-PAK 21 TAB) 10 MG (21) TBPK tablet, 6 day taper; take as directed on package instructions, Disp: 21 tablet, Rfl: 0   cyclobenzaprine  (FLEXERIL ) 10 MG tablet, Take 0.5-1 tablets (5-10 mg total) by mouth at bedtime as needed for muscle spasms., Disp: 30 tablet, Rfl: 1   desloratadine  (CLARINEX ) 5 MG tablet, Take 1 tablet (5 mg total) by mouth daily., Disp: 30 tablet, Rfl: 2   hydrOXYzine  (ATARAX ) 10 MG tablet, Take 1 tablet (10 mg total) by mouth 3 (three) times daily as needed for itching., Disp: 30 tablet, Rfl: 0   KRILL OIL PO, Take 1 tablet by mouth daily., Disp: , Rfl:    MAGNESIUM  PO, Take 1 tablet by mouth daily. Potassium, Disp: , Rfl:    methocarbamol  (ROBAXIN ) 500 MG tablet, Take 1 tablet (500 mg total) by mouth 4 (four) times daily., Disp: 30 tablet, Rfl: 1   naltrexone  (DEPADE) 50 MG tablet, Take 1 tablet (50 mg total) by mouth daily., Disp: 30 tablet, Rfl: 3   omeprazole  (PRILOSEC) 40 MG capsule, Take 1 capsule (40 mg total) by mouth daily., Disp: 90 capsule, Rfl: 1   OVER THE COUNTER MEDICATION, Place 1 patch onto the skin daily. Lifewave X39 stem  cell Patch, Disp: , Rfl:    OVER THE COUNTER MEDICATION, Take 1 tablet by mouth daily. Instaflex joint support, Disp: , Rfl:    sertraline  (ZOLOFT ) 100 MG tablet, Take 1.5 tablets (150 mg total) by mouth daily., Disp: 135 tablet, Rfl: 1   valACYclovir  (VALTREX ) 1000 MG tablet, Take 1 tablet (1,000 mg total) by mouth daily., Disp: 30 tablet, Rfl: 5   Medications ordered in this encounter:  Meds ordered this encounter  Medications   amoxicillin -clavulanate (AUGMENTIN ) 875-125 MG tablet    Sig: Take 1 tablet by mouth 2 (two) times daily.    Dispense:  14 tablet    Refill:  0    Supervising Provider:   Corine Dice [[id number]]   predniSONE  (STERAPRED UNI-PAK 21 TAB) 10 MG (21) TBPK tablet    Sig: 6 day taper; take as directed on package instructions    Dispense:  21 tablet    Refill:  0    Supervising Provider:   Corine Dice [[id number]]     *If you need refills on other medications prior to your next appointment, please contact your pharmacy*  Follow-Up: Call back or seek an in-person evaluation if the symptoms worsen or if the condition fails to improve as anticipated.  [facility] [phone]  Other Instructions Sinus Infection, Adult A sinus infection, also called sinusitis, is inflammation of your sinuses. Sinuses are hollow spaces in the bones around your face. Your sinuses  are located: Around your eyes. In the middle of your forehead. Behind your nose. In your cheekbones. Mucus normally drains out of your sinuses. When your nasal tissues become inflamed or swollen, mucus can become trapped or blocked. This allows bacteria, viruses, and fungi to grow, which leads to infection. Most infections of the sinuses are caused by a virus. A sinus infection can develop quickly. It can last for up to 4 weeks (acute) or for more than 12 weeks (chronic). A sinus infection often develops after a cold. What are the causes? This condition is caused by anything that  creates swelling in the sinuses or stops mucus from draining. This includes: Allergies. Asthma. Infection from bacteria or viruses. Deformities or blockages in your nose or sinuses. Abnormal growths in the nose (nasal polyps). Pollutants, such as chemicals or irritants in the air. Infection from fungi. This is rare. What increases the risk? You are more likely to develop this condition if you: Have a weak body defense system (immune system). Do a lot of swimming or diving. Overuse nasal sprays. Smoke. What are the signs or symptoms? The main symptoms of this condition are pain and a feeling of pressure around the affected sinuses. Other symptoms include: Stuffy nose or congestion that makes it difficult to breathe through your nose. Thick yellow or greenish drainage from your nose. Tenderness, swelling, and warmth over the affected sinuses. A cough that may get worse at night. Decreased sense of smell and taste. Extra mucus that collects in the throat or the back of the nose (postnasal drip) causing a sore throat or bad breath. Tiredness (fatigue). Fever. How is this diagnosed? This condition is diagnosed based on: Your symptoms. Your medical history. A physical exam. Tests to find out if your condition is acute or chronic. This may include: Checking your nose for nasal polyps. Viewing your sinuses using a device that has a light (endoscope). Testing for allergies or bacteria. Imaging tests, such as an MRI or CT scan. In rare cases, a bone biopsy may be done to rule out more serious types of fungal sinus disease. How is this treated? Treatment for a sinus infection depends on the cause and whether your condition is chronic or acute. If caused by a virus, your symptoms should go away on their own within 10 days. You may be given medicines to relieve symptoms. They include: Medicines that shrink swollen nasal passages (decongestants). A spray that eases inflammation of the  nostrils (topical intranasal corticosteroids). Rinses that help get rid of thick mucus in your nose (nasal saline washes). Medicines that treat allergies (antihistamines). Over-the-counter pain relievers. If caused by bacteria, your health care provider may recommend waiting to see if your symptoms improve. Most bacterial infections will get better without antibiotic medicine. You may be given antibiotics if you have: A severe infection. A weak immune system. If caused by narrow nasal passages or nasal polyps, surgery may be needed. Follow these instructions at home: Medicines Take, use, or apply over-the-counter and prescription medicines only as told by your health care provider. These may include nasal sprays. If you were prescribed an antibiotic medicine, take it as told by your health care provider. Do not stop taking the antibiotic even if you start to feel better. Hydrate and humidify  Drink enough fluid to keep your urine pale yellow. Staying hydrated will help to thin your mucus. Use a cool mist humidifier to keep the humidity level in your home above 50%. Inhale steam for 10-15 minutes, 3-4  times a day, or as told by your health care provider. You can do this in the bathroom while a hot shower is running. Limit your exposure to cool or dry air. Rest Rest as much as possible. Sleep with your head raised (elevated). Make sure you get enough sleep each night. General instructions  Apply a warm, moist washcloth to your face 3-4 times a day or as told by your health care provider. This will help with discomfort. Use nasal saline washes as often as told by your health care provider. Wash your hands often with soap and water  to reduce your exposure to germs. If soap and water  are not available, use hand sanitizer. Do not smoke. Avoid being around people who are smoking (secondhand smoke). Keep all follow-up visits. This is important. Contact a health care provider if: You have a  fever. Your symptoms get worse. Your symptoms do not improve within 10 days. Get help right away if: You have a severe headache. You have persistent vomiting. You have severe pain or swelling around your face or eyes. You have vision problems. You develop confusion. Your neck is stiff. You have trouble breathing. These symptoms may be an emergency. Get help right away. Call 911. Do not wait to see if the symptoms will go away. Do not drive yourself to the hospital. Summary A sinus infection is soreness and inflammation of your sinuses. Sinuses are hollow spaces in the bones around your face. This condition is caused by nasal tissues that become inflamed or swollen. The swelling traps or blocks the flow of mucus. This allows bacteria, viruses, and fungi to grow, which leads to infection. If you were prescribed an antibiotic medicine, take it as told by your health care provider. Do not stop taking the antibiotic even if you start to feel better. Keep all follow-up visits. This is important. This information is not intended to replace advice given to you by your health care provider. Make sure you discuss any questions you have with your health care provider. Document Revised: 05/18/2021 Document Reviewed: 05/18/2021 Elsevier Patient Education  2024 Elsevier [employer].   If you have been instructed to have an in-person evaluation today at a local Urgent Care facility, please use the link below. It will take you to a list of all of our available [facility], including address, phone number and hours of operation. Please do not delay care.  [facility]  If you or a family member do not have a primary care provider, use the link below to schedule a visit and establish care. When you choose a [facility] primary care physician or advanced practice provider, you gain a long-term partner in health. Find a Primary Care Provider  Learn more about [facility] in-office and  virtual care options: [facility] Get Care Now

## 2023-10-26 DIAGNOSIS — R635 Abnormal weight gain: Secondary | ICD-10-CM | POA: Diagnosis not present

## 2023-10-26 DIAGNOSIS — Z860100 Personal history of colon polyps, unspecified: Secondary | ICD-10-CM | POA: Diagnosis not present

## 2023-10-27 ENCOUNTER — Ambulatory Visit: Payer: Self-pay

## 2023-10-27 NOTE — Telephone Encounter (Signed)
 Patient called back to check on status of prescription: stating she wanted the medication sent prior to office closing: informed pt that rule of thumb Abx usually require an appointment: offered pt a virtual appointment: pt stated she was not going to do that because she just did an appointment and was not going to pay for another.

## 2023-10-27 NOTE — Telephone Encounter (Signed)
 Copied from CRM [id number]. Topic: Clinical - Red Word Triage >> Oct 27, 2023  9:09 AM Marissa P wrote: Red Word that prompted transfer to Nurse Triage: Some pain in chest and throat. Patient has a dry cough. Also, head hurts.  Chief Complaint: cough, sore throat Symptoms: dry cough, sore throat, drainage in  throat Frequency: x 2 weeks Pertinent Negatives: Patient denies fever Disposition: [] ED /[] Urgent Care (no appt availability in office) / [] Appointment(In office/virtual)/ []  [facility]/ [] Home Care/ [x] Refused Recommended Disposition /[] [facility]/ []  Follow-up with PCP Additional Notes: pt stated just completed prednisone  and ABX yesterday: today feels worse. Upper chest pressure related to congestion in chest - pt states she can feel the congestion & she coughs but it will not come up. Headache 7/10: back of neck area. Offered pt appt to come in a be seen: pt refused stated she just did video appt, took tx & she wants information sent to a provider so they can send new/more Rx to help with this.  Reason for Disposition  [1] Sore throat with cough/cold symptoms AND [2] present > 5 days  [1] Nasal discharge AND [2] present > 10 days  Answer Assessment - Initial Assessment Questions 1. ONSET: "When did the cough begin?"      X 2 week 2. SEVERITY: "How bad is the cough today?"      moderate 3. SPUTUM: "Describe the color of your sputum" (none, dry cough; clear, white, yellow, green)     Dry cough 4. HEMOPTYSIS: "Are you coughing up any blood?" If so ask: "How much?" (flecks, streaks, tablespoons, etc.)     no 5. DIFFICULTY BREATHING: "Are you having difficulty breathing?" If Yes, ask: "How bad is it?" (e.g., mild, moderate, severe)    - MILD: No SOB at rest, mild SOB with walking, speaks normally in sentences, can lie down, no retractions, pulse < 100.    - MODERATE: SOB at rest, SOB with minimal exertion and prefers to sit, cannot lie down flat, speaks in phrases,  mild retractions, audible wheezing, pulse 100-120.    - SEVERE: Very SOB at rest, speaks in single words, struggling to breathe, sitting hunched forward, retractions, pulse > 120      no 6. FEVER: "Do you have a fever?" If Yes, ask: "What is your temperature, how was it measured, and when did it start?"     no 7. CARDIAC HISTORY: "Do you have any history of heart disease?" (e.g., heart attack, congestive heart failure)      N/a 8. LUNG HISTORY: "Do you have any history of lung disease?"  (e.g., pulmonary embolus, asthma, emphysema)     N/a 9. PE RISK FACTORS: "Do you have a history of blood clots?" (or: recent major surgery, recent prolonged travel, bedridden)     N/a 10. OTHER SYMPTOMS: "Do you have any other symptoms?" (e.g., runny nose, wheezing, chest pain)       no 11. PREGNANCY: "Is there any chance you are pregnant?" "When was your last menstrual period?"       N/a 12. TRAVEL: "Have you traveled out of the country in the last month?" (e.g., travel history, exposures)       N/a  Answer Assessment - Initial Assessment Questions 1. ONSET: "When did the throat start hurting?" (Hours or days ago)      X 2  weeks 2. SEVERITY: "How bad is the sore throat?" (Scale 1-10; mild, moderate or severe)   - MILD (1-3):  Doesn't interfere with eating or normal activities.   - MODERATE (4-7): Interferes with eating some solids and normal activities.   - SEVERE (8-10):  Excruciating pain, interferes with most normal activities.   - SEVERE WITH DYSPHAGIA (10): Can't swallow liquids, drooling.     moderate 3. STREP EXPOSURE: "Has there been any exposure to strep within the past week?" If Yes, ask: "What type of contact occurred?"      N/a 4.  VIRAL SYMPTOMS: "Are there any symptoms of a cold, such as a runny nose, cough, hoarse voice or red eyes?"      Dry cough - pt states a lot of congestion in chest related to chest pressure that will not come out 5. FEVER: "Do you have a fever?" If Yes, ask:  "What is your temperature, how was it measured, and when did it start?"     no 6. PUS ON THE TONSILS: "Is there pus on the tonsils in the back of your throat?"     no 7. OTHER SYMPTOMS: "Do you have any other symptoms?" (e.g., difficulty breathing, headache, rash)     Headache-near neck area 8. PREGNANCY: "Is there any chance you are pregnant?" "When was your last menstrual period?"   no  Protocols used: Cough - Acute Non-Productive-A-AH, Sore Throat-A-AH

## 2023-10-27 NOTE — Telephone Encounter (Signed)
Routing to provider for further advise

## 2023-10-28 NOTE — Telephone Encounter (Signed)
 The patient was evaluated.  It was determined she may have a bacterial infection and antibiotics and steroids were given.  Notably, she had 7 days of Augmentin  and prednisone .  If this did not help that it is unlikely her symptoms are due to a bacterial sinusitis.  Additional antibiotics are not likely to help so they will not be sent in.  If she wants further workup and treatment for this she should be evaluated in person by me or a provider at a different location with a same-day opening.  There are no treatments for the symptoms of sinus congestion that are not already available over-the-counter such as Afrin or Astepro .  For cough most treatments are also available over-the-counter.  If she wants Tessalon I can send that in but it is purely for symptomatic relief of cough

## 2023-10-30 ENCOUNTER — Telehealth: Payer: Self-pay | Admitting: Family Medicine

## 2023-10-30 ENCOUNTER — Other Ambulatory Visit: Payer: Self-pay | Admitting: Family Medicine

## 2023-10-30 ENCOUNTER — Ambulatory Visit: Payer: Self-pay

## 2023-10-30 MED ORDER — BENZONATATE 200 MG PO CAPS: 200.0000 mg | ORAL_CAPSULE | Freq: Two times a day (BID) | ORAL | 1 refills | Status: DC | PRN

## 2023-10-30 NOTE — Telephone Encounter (Signed)
 LVM to call office to inform pt of below.  Will also send providers note in mychart for patient as well.

## 2023-10-30 NOTE — Telephone Encounter (Signed)
 Copied from CRM [id number]. Topic: Clinical - Medical Advice >> Oct 30, 2023 11:33 AM Essie A wrote: Reason for CRM: Patient is calling because she finished antibiotics on May 1.  No video appts were available this week.  She wants to know if she can get a z-pack because she still has symptoms of coughing and congestion.  Please call her at [phone].

## 2023-10-30 NOTE — Telephone Encounter (Unsigned)
 Copied from CRM [id number]. Topic: Clinical - Medical Advice >> Oct 30, 2023  2:08 PM Rosaria Common wrote: Reason for CRM: Patient is calling because she finished antibiotics on May 1. No video appts were available this week. She wants to know if she can get a z-pack because she still has symptoms of coughing and congestion. Please call her at [phone]

## 2023-11-27 NOTE — Progress Notes (Incomplete)
 Complete physical exam  Patient: Connie Cole   DOB: March 19, 1971   53 y.o. Female  MRN: [id number]  Subjective:     No chief complaint on file.   Connie Cole is a 53 y.o. female who presents today for a complete physical exam. She reports consuming a {diet types:17450} diet. {types:19826} She generally feels {DESC; WELL/FAIRLY WELL/POORLY:18703}. She reports sleeping {DESC; WELL/FAIRLY WELL/POORLY:18703}. She does have additional problems to discuss today.   Mood -- Patient is here to follow up for depression and anxiety which have mostly been a reaction from grief when she lost her father 5 years ago, currently managing with Zoloft  150 mg daily which was increased at her last appointment. Taking medication without side effects, reports excellent compliance with treatment. Denies mood changes or SI/HI. She feels mood is improved since last visit ***. At last visit, it was discussed that she would like to slowly taper off the Zoloft  if her symptoms continue to improve. Today she is interested in ***  Alcohol use disorder -- Currently taking naltrexone  50 mg daily to help with her ongoing efforts to decrease alcohol consumption.    Most recent fall risk assessment:    09/28/2022    4:28 PM  Fall Risk   Falls in the past year? 1  Number falls in past yr: 0  Injury with Fall? 1  Follow up Falls evaluation completed     Most recent depression screenings:    09/28/2022    4:28 PM 04/13/2022    4:22 PM  PHQ 2/9 Scores  PHQ - 2 Score 0 0  PHQ- 9 Score 3 0    {VISON DENTAL STD PSA (Optional):27386}  {History (Optional):23778}  Patient Care Team: Melene Sportsman as PCP - General (Physician Assistant) Meriam Stamp, MD as Consulting Physician (Obstetrics and Gynecology) Virgle Grime, MD as Consulting Physician (Internal Medicine) Obgyn, Otelia Blew, Birda Buffy, MD as Consulting Physician (Gastroenterology)   Outpatient Medications Prior to Visit  Medication Sig    amoxicillin -clavulanate (AUGMENTIN ) 875-125 MG tablet Take 1 tablet by mouth 2 (two) times daily.   benzonatate  (TESSALON ) 200 MG capsule Take 1 capsule (200 mg total) by mouth 2 (two) times daily as needed for cough.   cyclobenzaprine  (FLEXERIL ) 10 MG tablet Take 0.5-1 tablets (5-10 mg total) by mouth at bedtime as needed for muscle spasms.   desloratadine  (CLARINEX ) 5 MG tablet Take 1 tablet (5 mg total) by mouth daily.   hydrOXYzine  (ATARAX ) 10 MG tablet Take 1 tablet (10 mg total) by mouth 3 (three) times daily as needed for itching.   KRILL OIL PO Take 1 tablet by mouth daily.   MAGNESIUM  PO Take 1 tablet by mouth daily. Potassium   methocarbamol  (ROBAXIN ) 500 MG tablet Take 1 tablet (500 mg total) by mouth 4 (four) times daily.   naltrexone  (DEPADE) 50 MG tablet Take 1 tablet (50 mg total) by mouth daily.   omeprazole  (PRILOSEC) 40 MG capsule Take 1 capsule (40 mg total) by mouth daily.   OVER THE COUNTER MEDICATION Place 1 patch onto the skin daily. Lifewave X39 stem cell Patch   OVER THE COUNTER MEDICATION Take 1 tablet by mouth daily. Instaflex joint support   predniSONE  (STERAPRED UNI-PAK 21 TAB) 10 MG (21) TBPK tablet 6 day taper; take as directed on package instructions   sertraline  (ZOLOFT ) 100 MG tablet Take 1.5 tablets (150 mg total) by mouth daily.   valACYclovir  (VALTREX ) 1000 MG tablet Take 1 tablet (1,000 mg total) by mouth daily.  No facility-administered medications prior to visit.    ROS        Objective:     LMP 09/22/2021  {Vitals History (Optional):23777}  Physical Exam   No results found for any visits on 11/28/23. {Show previous labs (optional):23779}    Assessment & Plan:    Routine Health Maintenance and Physical Exam   There is no immunization history on file for this patient.  Health Maintenance  Topic Date Due   COVID-19 Vaccine (1) Never done   HIV Screening  Never done   Hepatitis C Screening  Never done   DTaP/Tdap/Td (1 - Tdap)  Never done   MAMMOGRAM  10/13/2021   Cervical Cancer Screening (HPV/Pap Cotest)  10/14/2022   Zoster Vaccines- Shingrix (1 of 2) 03/28/2031 (Originally 12/24/1989)   INFLUENZA VACCINE  01/26/2024   Colonoscopy  12/30/2028   HPV VACCINES  Aged Out   Meningococcal B Vaccine  Aged Out    Discussed health benefits of physical activity, and encouraged her to engage in regular exercise appropriate for her age and condition.  Problem List Items Addressed This Visit   None  No follow-ups on file.     Odilia Bennett, PA-C

## 2023-11-28 ENCOUNTER — Encounter

## 2023-12-19 DIAGNOSIS — M542 Cervicalgia: Secondary | ICD-10-CM | POA: Diagnosis not present

## 2023-12-19 DIAGNOSIS — M79642 Pain in left hand: Secondary | ICD-10-CM | POA: Diagnosis not present

## 2023-12-19 DIAGNOSIS — M79641 Pain in right hand: Secondary | ICD-10-CM | POA: Diagnosis not present

## 2023-12-20 ENCOUNTER — Other Ambulatory Visit: Payer: Self-pay | Admitting: Nurse Practitioner

## 2023-12-20 DIAGNOSIS — B009 Herpesviral infection, unspecified: Secondary | ICD-10-CM

## 2023-12-21 ENCOUNTER — Other Ambulatory Visit: Payer: Self-pay

## 2023-12-21 DIAGNOSIS — B009 Herpesviral infection, unspecified: Secondary | ICD-10-CM

## 2023-12-21 MED ORDER — VALACYCLOVIR HCL 1 G PO TABS: 1000.0000 mg | ORAL_TABLET | Freq: Every day | ORAL | 5 refills | Status: AC

## 2023-12-25 ENCOUNTER — Other Ambulatory Visit: Payer: Self-pay | Admitting: Family Medicine

## 2023-12-25 DIAGNOSIS — R12 Heartburn: Secondary | ICD-10-CM

## 2024-02-08 ENCOUNTER — Other Ambulatory Visit: Payer: Self-pay | Admitting: Orthopedic Surgery

## 2024-02-23 ENCOUNTER — Encounter (HOSPITAL_BASED_OUTPATIENT_CLINIC_OR_DEPARTMENT_OTHER): Payer: Self-pay | Admitting: Orthopedic Surgery

## 2024-02-23 ENCOUNTER — Other Ambulatory Visit: Payer: Self-pay

## 2024-03-04 ENCOUNTER — Other Ambulatory Visit: Payer: Self-pay

## 2024-03-04 ENCOUNTER — Ambulatory Visit (HOSPITAL_BASED_OUTPATIENT_CLINIC_OR_DEPARTMENT_OTHER): Admitting: Anesthesiology

## 2024-03-04 ENCOUNTER — Encounter (HOSPITAL_BASED_OUTPATIENT_CLINIC_OR_DEPARTMENT_OTHER): Payer: Self-pay | Admitting: Orthopedic Surgery

## 2024-03-04 ENCOUNTER — Encounter (HOSPITAL_BASED_OUTPATIENT_CLINIC_OR_DEPARTMENT_OTHER)
Admission: RE | Disposition: A | Discharge status: Home / Self Care | Payer: Self-pay | Source: Home / Self Care | Attending: Orthopedic Surgery

## 2024-03-04 ENCOUNTER — Ambulatory Visit (HOSPITAL_BASED_OUTPATIENT_CLINIC_OR_DEPARTMENT_OTHER)
Admission: RE | Admit: 2024-03-04 | Discharge: 2024-03-04 | Disposition: A | Discharge status: Home / Self Care | Attending: Orthopedic Surgery | Admitting: Orthopedic Surgery

## 2024-03-04 DIAGNOSIS — Z01818 Encounter for other preprocedural examination: Secondary | ICD-10-CM

## 2024-03-04 DIAGNOSIS — Z87891 Personal history of nicotine dependence: Secondary | ICD-10-CM | POA: Insufficient documentation

## 2024-03-04 DIAGNOSIS — G5601 Carpal tunnel syndrome, right upper limb: Secondary | ICD-10-CM | POA: Diagnosis present

## 2024-03-04 DIAGNOSIS — M1990 Unspecified osteoarthritis, unspecified site: Secondary | ICD-10-CM | POA: Insufficient documentation

## 2024-03-04 DIAGNOSIS — K219 Gastro-esophageal reflux disease without esophagitis: Secondary | ICD-10-CM | POA: Diagnosis not present

## 2024-03-04 HISTORY — PX: CARPAL TUNNEL RELEASE: SHX101

## 2024-03-04 SURGERY — CARPAL TUNNEL RELEASE
Anesthesia: General | Site: Wrist | Laterality: Right

## 2024-03-04 MED ORDER — OXYCODONE HCL 5 MG/5ML PO SOLN: 5.0000 mg | Freq: Once | ORAL | Status: DC | PRN

## 2024-03-04 MED ORDER — FENTANYL CITRATE (PF) 100 MCG/2ML IJ SOLN: 25.0000 ug | INTRAMUSCULAR | Status: DC | PRN

## 2024-03-04 MED ORDER — KETOROLAC TROMETHAMINE 30 MG/ML IJ SOLN
INTRAMUSCULAR | Status: AC
  Filled 2024-03-04: qty 1

## 2024-03-04 MED ORDER — LIDOCAINE HCL (CARDIAC) PF 100 MG/5ML IV SOSY
PREFILLED_SYRINGE | INTRAVENOUS | Status: DC | PRN
  Administered 2024-03-04: 80 mg via INTRAVENOUS

## 2024-03-04 MED ORDER — ACETAMINOPHEN 500 MG PO TABS
ORAL_TABLET | ORAL | Status: AC
Start: 2024-03-04 — End: 2024-03-04
  Filled 2024-03-04: qty 2

## 2024-03-04 MED ORDER — 0.9 % SODIUM CHLORIDE (POUR BTL) OPTIME
TOPICAL | Status: DC | PRN
  Administered 2024-03-04: 1000 mL

## 2024-03-04 MED ORDER — ONDANSETRON HCL 4 MG/2ML IJ SOLN
INTRAMUSCULAR | Status: DC | PRN
  Administered 2024-03-04: 4 mg via INTRAVENOUS

## 2024-03-04 MED ORDER — ONDANSETRON HCL 4 MG/2ML IJ SOLN: 4.0000 mg | Freq: Once | INTRAMUSCULAR | Status: DC | PRN

## 2024-03-04 MED ORDER — DEXMEDETOMIDINE HCL IN NACL 80 MCG/20ML IV SOLN
INTRAVENOUS | Status: DC | PRN
  Administered 2024-03-04: 4 ug via INTRAVENOUS

## 2024-03-04 MED ORDER — HYDROCODONE-ACETAMINOPHEN 5-325 MG PO TABS: 1.0000 | ORAL_TABLET | Freq: Four times a day (QID) | ORAL | 0 refills | Status: AC | PRN

## 2024-03-04 MED ORDER — OXYCODONE HCL 5 MG PO TABS: 5.0000 mg | ORAL_TABLET | Freq: Once | ORAL | Status: DC | PRN

## 2024-03-04 MED ORDER — MIDAZOLAM HCL 2 MG/2ML IJ SOLN
INTRAMUSCULAR | Status: AC
  Filled 2024-03-04: qty 2

## 2024-03-04 MED ORDER — CEFAZOLIN SODIUM-DEXTROSE 2-4 GM/100ML-% IV SOLN
INTRAVENOUS | Status: AC
  Filled 2024-03-04: qty 100

## 2024-03-04 MED ORDER — PROPOFOL 500 MG/50ML IV EMUL: INTRAVENOUS | Status: DC | PRN

## 2024-03-04 MED ORDER — PROPOFOL 10 MG/ML IV BOLUS
INTRAVENOUS | Status: DC | PRN
  Administered 2024-03-04: 50 mg via INTRAVENOUS
  Administered 2024-03-04: 200 mg via INTRAVENOUS

## 2024-03-04 MED ORDER — LIDOCAINE HCL (PF) 0.5 % IJ SOLN
INTRAMUSCULAR | Status: AC
  Filled 2024-03-04: qty 50

## 2024-03-04 MED ORDER — ACETAMINOPHEN 500 MG PO TABS
1000.0000 mg | ORAL_TABLET | Freq: Once | ORAL | Status: AC
  Administered 2024-03-04: 1000 mg via ORAL

## 2024-03-04 MED ORDER — BUPIVACAINE HCL (PF) 0.25 % IJ SOLN
INTRAMUSCULAR | Status: DC | PRN
  Administered 2024-03-04: 9 mL

## 2024-03-04 MED ORDER — BUPIVACAINE HCL (PF) 0.25 % IJ SOLN
INTRAMUSCULAR | Status: AC
Start: 2024-03-04 — End: 2024-03-04
  Filled 2024-03-04: qty 60

## 2024-03-04 MED ORDER — FENTANYL CITRATE (PF) 100 MCG/2ML IJ SOLN
INTRAMUSCULAR | Status: DC | PRN
  Administered 2024-03-04 (×2): 50 ug via INTRAVENOUS

## 2024-03-04 MED ORDER — LACTATED RINGERS IV SOLN: INTRAVENOUS | Status: DC

## 2024-03-04 MED ORDER — PROPOFOL 500 MG/50ML IV EMUL
INTRAVENOUS | Status: AC
  Filled 2024-03-04: qty 50

## 2024-03-04 MED ORDER — DEXAMETHASONE SODIUM PHOSPHATE 10 MG/ML IJ SOLN
INTRAMUSCULAR | Status: DC | PRN
  Administered 2024-03-04: 5 mg via INTRAVENOUS

## 2024-03-04 MED ORDER — CEFAZOLIN SODIUM-DEXTROSE 2-4 GM/100ML-% IV SOLN
2.0000 g | INTRAVENOUS | Status: AC
  Administered 2024-03-04: 2 g via INTRAVENOUS

## 2024-03-04 MED ORDER — KETOROLAC TROMETHAMINE 30 MG/ML IJ SOLN
INTRAMUSCULAR | Status: DC | PRN
  Administered 2024-03-04: 30 mg via INTRAVENOUS

## 2024-03-04 MED ORDER — FENTANYL CITRATE (PF) 100 MCG/2ML IJ SOLN
INTRAMUSCULAR | Status: AC
  Filled 2024-03-04: qty 2

## 2024-03-04 MED ORDER — MIDAZOLAM HCL 2 MG/2ML IJ SOLN
INTRAMUSCULAR | Status: DC | PRN
  Administered 2024-03-04: 2 mg via INTRAVENOUS

## 2024-03-04 SURGICAL SUPPLY — 31 items
BLADE SURG 15 STRL LF DISP TIS (BLADE) ×2 IMPLANT
BNDG COMPR ESMARK 4X3 LF (GAUZE/BANDAGES/DRESSINGS) IMPLANT
BNDG ELASTIC 3INX 5YD STR LF (GAUZE/BANDAGES/DRESSINGS) ×1 IMPLANT
BNDG ELASTIC 4INX 5YD STR LF (GAUZE/BANDAGES/DRESSINGS) IMPLANT
BNDG GAUZE DERMACEA FLUFF 4 (GAUZE/BANDAGES/DRESSINGS) ×1 IMPLANT
CHLORAPREP W/TINT 26 (MISCELLANEOUS) ×1 IMPLANT
CORD BIPOLAR FORCEPS 12FT (ELECTRODE) ×1 IMPLANT
COVER BACK TABLE 60X90IN (DRAPES) ×1 IMPLANT
COVER MAYO STAND STRL (DRAPES) ×1 IMPLANT
CUFF TOURN SGL QUICK 18X4 (TOURNIQUET CUFF) ×1 IMPLANT
DRAPE EXTREMITY T 121X128X90 (DISPOSABLE) ×1 IMPLANT
DRAPE SURG 17X23 STRL (DRAPES) ×1 IMPLANT
GAUZE PAD ABD 8X10 STRL (GAUZE/BANDAGES/DRESSINGS) ×1 IMPLANT
GAUZE SPONGE 4X4 12PLY STRL (GAUZE/BANDAGES/DRESSINGS) ×1 IMPLANT
GAUZE SPONGE 4X4 12PLY STRL LF (GAUZE/BANDAGES/DRESSINGS) IMPLANT
GAUZE XEROFORM 1X8 LF (GAUZE/BANDAGES/DRESSINGS) ×1 IMPLANT
GLOVE BIO SURGEON STRL SZ7.5 (GLOVE) ×1 IMPLANT
GLOVE BIOGEL PI IND STRL 8 (GLOVE) ×1 IMPLANT
GOWN STRL REUS W/ TWL LRG LVL3 (GOWN DISPOSABLE) ×1 IMPLANT
GOWN STRL REUS W/TWL XL LVL3 (GOWN DISPOSABLE) ×1 IMPLANT
NDL HYPO 25X1 1.5 SAFETY (NEEDLE) ×1 IMPLANT
NEEDLE HYPO 25X1 1.5 SAFETY (NEEDLE) ×1 IMPLANT
NS IRRIG 1000ML POUR BTL (IV SOLUTION) ×1 IMPLANT
PACK BASIN DAY SURGERY FS (CUSTOM PROCEDURE TRAY) ×1 IMPLANT
PADDING CAST ABS COTTON 4X4 ST (CAST SUPPLIES) ×1 IMPLANT
STOCKINETTE 4X48 STRL (DRAPES) ×1 IMPLANT
SUT ETHILON 4 0 PS 2 18 (SUTURE) ×1 IMPLANT
SYR BULB EAR ULCER 3OZ GRN STR (SYRINGE) ×1 IMPLANT
SYR CONTROL 10ML LL (SYRINGE) ×1 IMPLANT
TOWEL GREEN STERILE FF (TOWEL DISPOSABLE) ×2 IMPLANT
UNDERPAD 30X36 HEAVY ABSORB (UNDERPADS AND DIAPERS) ×1 IMPLANT

## 2024-03-04 NOTE — Discharge Instructions (Signed)

## 2024-03-04 NOTE — Anesthesia Procedure Notes (Signed)
 Procedure Name: LMA Insertion Date/Time: 03/04/2024 1:03 PM  Performed by: Donnell Berwyn SQUIBB, CRNAPre-anesthesia Checklist: Patient identified, Emergency Drugs available, Suction available, Patient being monitored and Timeout performed Patient Re-evaluated:Patient Re-evaluated prior to induction Oxygen Delivery Method: Circle system utilized Preoxygenation: Pre-oxygenation with 100% oxygen Induction Type: IV induction Ventilation: Mask ventilation without difficulty LMA: LMA inserted LMA Size: 4.0 Number of attempts: 1 Placement Confirmation: positive ETCO2 and breath sounds checked- equal and bilateral Tube secured with: Tape Dental Injury: Teeth and Oropharynx as per pre-operative assessment

## 2024-03-04 NOTE — Anesthesia Preprocedure Evaluation (Signed)
 Anesthesia Evaluation  Patient identified by MRN, date of birth, ID band Patient awake    Reviewed: Allergy & Precautions, NPO status , Patient's Chart, lab work & pertinent test results  History of Anesthesia Complications Negative for: history of anesthetic complications  Airway Mallampati: II  TM Distance: >3 FB Neck ROM: Full    Dental no notable dental hx. (+) Teeth Intact   Pulmonary neg pulmonary ROS, neg sleep apnea, neg COPD, Patient abstained from smoking.Not current smoker, former smoker   Pulmonary exam normal breath sounds clear to auscultation       Cardiovascular Exercise Tolerance: Good METS(-) hypertension(-) CAD and (-) Past MI negative cardio ROS (-) dysrhythmias  Rhythm:Regular Rate:Normal - Systolic murmurs    Neuro/Psych  PSYCHIATRIC DISORDERS Anxiety Depression    negative neurological ROS     GI/Hepatic ,GERD  Medicated and Controlled,,(+)     (-) substance abuse    Endo/Other  neg diabetesHypothyroidism    Renal/GU negative Renal ROS     Musculoskeletal   Abdominal   Peds  Hematology   Anesthesia Other Findings Past Medical History: No date: Anemia No date: Anxiety No date: Arthritis     Comment:  hands, hips No date: Depression No date: GERD (gastroesophageal reflux disease)     Comment:  occasional diet controlled- no med No date: Palpitations     Comment:  normal heart monitor x 1 wk in 2017 No date: Pituitary tumor  Reproductive/Obstetrics                              Anesthesia Physical Anesthesia Plan  ASA: 2  Anesthesia Plan: General   Post-op Pain Management: Tylenol  PO (pre-op)* and Toradol  IV (intra-op)*   Induction: Intravenous  PONV Risk Score and Plan: 3 and Ondansetron , Dexamethasone  and Midazolam   Airway Management Planned: LMA  Additional Equipment: None  Intra-op Plan:   Post-operative Plan: Extubation in OR  Informed  Consent: I have reviewed the patients History and Physical, chart, labs and discussed the procedure including the risks, benefits and alternatives for the proposed anesthesia with the patient or authorized representative who has indicated his/her understanding and acceptance.     Dental advisory given  Plan Discussed with: CRNA and Surgeon  Anesthesia Plan Comments: (Patient last had clear liquids at 11am; will not initiate anesthetic until at least 1pm.  Discussed risks of anesthesia with patient, including PONV, sore throat, lip/dental/eye damage. Rare risks discussed as well, such as cardiorespiratory and neurological sequelae, and allergic reactions. Discussed the role of CRNA in patient's perioperative care. Patient understands.)        Anesthesia Quick Evaluation

## 2024-03-04 NOTE — Anesthesia Postprocedure Evaluation (Signed)
 Anesthesia Post Note  Patient: Connie Cole  Procedure(s) Performed: CARPAL TUNNEL RELEASE (Right: Wrist)     Patient location during evaluation: PACU Anesthesia Type: General Level of consciousness: awake and alert Pain management: pain level controlled Vital Signs Assessment: post-procedure vital signs reviewed and stable Respiratory status: spontaneous breathing, nonlabored ventilation, respiratory function stable and patient connected to nasal cannula oxygen Cardiovascular status: blood pressure returned to baseline and stable Postop Assessment: no apparent nausea or vomiting Anesthetic complications: no   No notable events documented.  Last Vitals:  Vitals:   03/04/24 1345 03/04/24 1352  BP: 104/87 114/84  Pulse: 81 74  Resp: 20 20  Temp:  (!) 36.3 C  SpO2: 94% 93%    Last Pain:  Vitals:   03/04/24 1352  TempSrc: Temporal  PainSc: 0-No pain                 Rome Ade

## 2024-03-04 NOTE — Op Note (Signed)
 03/04/2024 [facility]                              OPERATIVE REPORT   PREOPERATIVE DIAGNOSIS:  Right carpal tunnel syndrome  POSTOPERATIVE DIAGNOSIS:  Right carpal tunnel syndrome  PROCEDURE:  Right carpal tunnel release  SURGEON:  Franky Curia, MD  ASSISTANT:  Isaiah Anton, PAC.  ANESTHESIA: General  IV FLUIDS:  Per anesthesia flow sheet  ESTIMATED BLOOD LOSS:  Minimal  COMPLICATIONS:  None  SPECIMENS:  None  TOURNIQUET TIME:    Total Tourniquet Time Documented: Upper Arm (Right) - 13 minutes Total: Upper Arm (Right) - 13 minutes   DISPOSITION:  Stable to PACU  LOCATION: [facility]  INDICATIONS:  53 y.o. yo female with numbness and tingling right hand.  Nocturnal symptoms. Positive nerve conduction studies. She wishes to proceed with right carpal tunnel release.  Risks, benefits and alternatives of surgery were discussed including the risk of blood loss; infection; damage to nerves, vessels, tendons, ligaments, bone; failure of surgery; need for additional surgery; complications with wound healing; continued pain; recurrence of carpal tunnel syndrome; and damage to motor branch. She voiced understanding of these risks and elected to proceed.   OPERATIVE COURSE:  After being identified preoperatively by myself, the patient and I agreed upon the procedure and site of procedure.  The surgical site was marked.  Surgical consent had been signed.  She was given IV Ancef  as preoperative antibiotic prophylaxis.  She was transferred to the operating room and placed on the operating room table in supine position with the Right upper extremity on an armboard.  General anesthesia was induced by the anesthesiologist.  Right upper extremity was prepped and draped in normal sterile orthopaedic fashion.  A surgical pause was performed between the surgeons, anesthesia, and operating room staff, and all were in agreement as to the patient, procedure, and site of  procedure.  Tourniquet at the proximal aspect of the extremity was inflated to 250 mmHg after exsanguination of the arm with an Esmarch bandage  Incision was made over the transverse carpal ligament and carried into the subcutaneous tissues by spreading technique.  Bipolar electrocautery was used to obtain hemostasis.  The palmar fascia was sharply incised.  The transverse carpal ligament was identified.  The fascia distal to the ligament was opened.  Retractor was placed and the flexor tendons were identified.  The flexor tendon to the ring finger was identified and retracted radially.  The transverse carpal ligament was then incised from distal to proximal under direct visualization.  Scissors were used to split the distal aspect of the volar antebrachial fascia.  A finger was placed into the wound to ensure complete decompression, which was the case.  The nerve was examined.  It was flattened and hyperemic. It was adherent to the radial leaflet.  The motor branch was identified and was intact.  The wound was copiously irrigated with sterile saline.  It was then closed with 4-0 nylon in a horizontal mattress fashion.  It was injected with 0.25% plain Marcaine  to aid in postoperative analgesia.  It was dressed with sterile Xeroform, 4x4s, an ABD, and wrapped with Kerlix and an Ace bandage.  Tourniquet was deflated at 13 minutes.  Fingertips were pink with brisk capillary refill after deflation of the tourniquet.  Operative drapes were broken down.  The patient was awoken from anesthesia safely.  She was transferred back to stretcher  and taken to the PACU in stable condition.  I will see her back in the office in 1 week for postoperative followup.  I will give her a prescription for Norco 5/325 1 tab PO q6 hours prn pain, dispense #15.    Valoree Agent, MD Electronically signed, 03/04/24

## 2024-03-04 NOTE — Transfer of Care (Signed)
 Immediate Anesthesia Transfer of Care Note  Patient: Connie Cole  Procedure(s) Performed: CARPAL TUNNEL RELEASE (Right: Wrist)  Patient Location: PACU  Anesthesia Type:General  Level of Consciousness: awake, alert , and patient cooperative  Airway & Oxygen Therapy: Patient Spontanous Breathing and Patient connected to nasal cannula oxygen  Post-op Assessment: Report given to RN and Post -op Vital signs reviewed and stable  Post vital signs: Reviewed and stable  Last Vitals:  Vitals Value Taken Time  BP 122/87 03/04/24 13:32  Temp    Pulse 82 03/04/24 13:32  Resp    SpO2 96 % 03/04/24 13:32  Vitals shown include unfiled device data.  Last Pain:  Vitals:   03/04/24 1143  TempSrc: Temporal  PainSc: 0-No pain      Patients Stated Pain Goal: 3 (03/04/24 1143)  Complications: No notable events documented.

## 2024-03-04 NOTE — Op Note (Signed)
 SURGERY ASSISTANT NOTE  PLACE OF SERVICE: [facility]   PATIENT INFORMATION: Name: Connie Cole MRN#: [id number] DOB: 1971-06-08  Date of surgery: 03/04/2024 Time of surgery: 1:37 PM  SURGERY ASSISTANT NOTE:  Assistant name: Isaiah Anton, PA-C Note date: 03/04/2024  I assisted Dr. Franky Curia on the following procedure(s) for the above-noted patient on the date and time documented:   Right carpal tunnel release   I provided assistance in the case as follows:  Assistance with exposure, retraction, bleeding control, protection of vital structures, instrumentation  and closure.   Isaiah Anton, PA-C

## 2024-03-04 NOTE — H&P (Signed)
 Connie Cole is an 54 y.o. female.   Chief Complaint: carpal tunnel syndrome HPI: 53 y.o. yo female with numbness and tingling right hand.  Nocturnal symptoms. Positive nerve conduction studies. She wishes to have right carpal tunnel release.   Allergies:  Allergies  Allergen Reactions   Latex Swelling   Latex Rash    Past Medical History:  Diagnosis Date   Anemia    Anxiety    Arthritis    hands, hips   Depression    GERD (gastroesophageal reflux disease)    occasional diet controlled- no med   Palpitations    normal heart monitor x 1 wk in 2017   Pituitary tumor     Past Surgical History:  Procedure Laterality Date   ABDOMINAL HYSTERECTOMY     partial   COLONOSCOPY     polyp   DILITATION & CURRETTAGE/HYSTROSCOPY WITH HYDROTHERMAL ABLATION N/A 06/03/2016   Procedure: DILATATION & CURETTAGE/HYSTEROSCOPY WITH HYDROTHERMAL ABLATION;  Surgeon: Charlie Flowers, MD;  Location: [facility];  Service: Gynecology;  Laterality: N/A;   IR RADIOLOGIST EVAL & MGMT  09/06/2016   RECTOPEXY N/A 10/08/2021   Procedure: RECTOPEXY;  Surgeon: Debby Hila, MD;  Location: [facility];  Service: General;  Laterality: N/A;   ROBOTIC ASSISTED LAPAROSCOPIC HYSTERECTOMY AND SALPINGECTOMY Bilateral 10/08/2021   Procedure: XI ROBOTIC ASSISTED LAPAROSCOPIC HYSTERECTOMY AND SALPINGECTOMY;  Surgeon: Flowers Charlie, MD;  Location: [facility];  Service: Gynecology;  Laterality: Bilateral;   TONSILLECTOMY     TOTAL HIP ARTHROPLASTY Right 01/10/2019   Procedure: TOTAL HIP ARTHROPLASTY ANTERIOR APPROACH;  Surgeon: Ernie Cough, MD;  Location: [facility];  Service: Orthopedics;  Laterality: Right;  70 mins   UMBILICAL HERNIA REPAIR N/A 10/08/2021   Procedure: HERNIA REPAIR UMBILICAL ADULT;  Surgeon: Debby Hila, MD;  Location: [facility];  Service: General;  Laterality: N/A;    Family History: Family History  Problem Relation Age of Onset   Stroke Mother    Heart disease Mother    Hypertension Mother    Cancer  Father        eye   Healthy Sister     Social History:   reports that she quit smoking about 23 years ago. Her smoking use included cigarettes. She started smoking about 28 years ago. She has a 2.5 pack-year smoking history. She has been exposed to tobacco smoke. She has never used smokeless tobacco. She reports current alcohol use of about 2.0 - 3.0 standard drinks of alcohol per week. She reports that she does not use drugs.  Medications: Medications Prior to Admission  Medication Sig Dispense Refill   KRILL OIL PO Take 1 tablet by mouth daily.     omeprazole  (PRILOSEC) 40 MG capsule TAKE 1 CAPSULE (40 MG TOTAL) BY MOUTH DAILY. 90 capsule 1   saccharomyces boulardii (FLORASTOR) 250 MG capsule Take 250 mg by mouth 2 (two) times daily.     sertraline  (ZOLOFT ) 100 MG tablet Take 1.5 tablets (150 mg total) by mouth daily. 135 tablet 1   valACYclovir  (VALTREX ) 1000 MG tablet Take 1 tablet (1,000 mg total) by mouth daily. 30 tablet 5   naltrexone  (DEPADE) 50 MG tablet Take 1 tablet (50 mg total) by mouth daily. 30 tablet 3    No results found for this or any previous visit (from the past 48 hours).  No results found.    Blood pressure 125/84, pulse 76, temperature (!) 97.3 F (36.3 C), temperature source Temporal, resp. rate 16, height 5' 3 (1.6 m), weight 80.6  kg, last menstrual period 09/22/2021, SpO2 100%.  General appearance: alert, cooperative, and appears stated age Head: Normocephalic, without obvious abnormality, atraumatic Neck: supple, symmetrical, trachea midline Extremities: Intact capillary refill all digits.  +epl/fpl/io.  No wounds.  Skin: Skin color, texture, turgor normal. No rashes or lesions Neurologic: Grossly normal Incision/Wound: none  Assessment/Plan Right carpal tunnel syndrome.  Non operative and operative treatment options have been discussed with the patient and patient wishes to proceed with operative treatment. Risks, benefits and alternatives of  surgery were discussed including risks of blood loss, infection, damage to nerves/vessels/tendons/ligament/bone, failure of surgery, need for additional surgery, complication with wound healing, stiffness, damage to motor branch, recurrence.  She voiced understanding of these risks and elected to proceed.    Connie Cole 03/04/2024, 12:41 PM

## 2024-03-05 ENCOUNTER — Encounter (HOSPITAL_BASED_OUTPATIENT_CLINIC_OR_DEPARTMENT_OTHER): Payer: Self-pay | Admitting: Orthopedic Surgery

## 2024-08-12 ENCOUNTER — Ambulatory Visit
